# Patient Record
Sex: Female | Born: 1965
Health system: Southern US, Community
[De-identification: ages and names within clinical notes are randomized; demographics above are authoritative.]

## PROBLEM LIST (undated history)

## (undated) DIAGNOSIS — E039 Hypothyroidism, unspecified: Secondary | ICD-10-CM

## (undated) DIAGNOSIS — G40909 Epilepsy, unspecified, not intractable, without status epilepticus: Secondary | ICD-10-CM

## (undated) DIAGNOSIS — G250 Essential tremor: Secondary | ICD-10-CM

## (undated) DIAGNOSIS — E785 Hyperlipidemia, unspecified: Secondary | ICD-10-CM

## (undated) DIAGNOSIS — T4145XA Adverse effect of unspecified anesthetic, initial encounter: Secondary | ICD-10-CM

## (undated) DIAGNOSIS — T8859XA Other complications of anesthesia, initial encounter: Secondary | ICD-10-CM

## (undated) DIAGNOSIS — Z9889 Other specified postprocedural states: Secondary | ICD-10-CM

## (undated) DIAGNOSIS — I639 Cerebral infarction, unspecified: Secondary | ICD-10-CM

## (undated) DIAGNOSIS — R112 Nausea with vomiting, unspecified: Secondary | ICD-10-CM

## (undated) HISTORY — PX: TOTAL KNEE ARTHROPLASTY: SHX125

## (undated) HISTORY — DX: Hyperlipidemia, unspecified: E78.5

## (undated) HISTORY — PX: APPENDECTOMY: SHX54

## (undated) HISTORY — PX: ABDOMINAL HYSTERECTOMY: SHX81

## (undated) HISTORY — PX: CARPAL TUNNEL RELEASE: SHX101

## (undated) HISTORY — DX: Cerebral infarction, unspecified: I63.9

---

## 1998-10-20 ENCOUNTER — Inpatient Hospital Stay (HOSPITAL_COMMUNITY): Admission: AD | Admit: 1998-10-20 | Discharge: 1998-10-20 | Payer: Self-pay | Admitting: *Deleted

## 1998-10-26 ENCOUNTER — Inpatient Hospital Stay (HOSPITAL_COMMUNITY): Admission: AD | Admit: 1998-10-26 | Discharge: 1998-10-26 | Payer: Self-pay | Admitting: *Deleted

## 1998-11-09 ENCOUNTER — Inpatient Hospital Stay (HOSPITAL_COMMUNITY): Admission: AD | Admit: 1998-11-09 | Discharge: 1998-11-11 | Payer: Self-pay | Admitting: *Deleted

## 1998-11-09 ENCOUNTER — Encounter (INDEPENDENT_AMBULATORY_CARE_PROVIDER_SITE_OTHER): Payer: Self-pay

## 1998-12-15 ENCOUNTER — Other Ambulatory Visit: Admission: RE | Admit: 1998-12-15 | Discharge: 1998-12-15 | Payer: Self-pay | Admitting: *Deleted

## 1999-03-19 ENCOUNTER — Inpatient Hospital Stay (HOSPITAL_COMMUNITY): Admission: EM | Admit: 1999-03-19 | Discharge: 1999-03-24 | Payer: Self-pay | Admitting: Emergency Medicine

## 1999-03-19 ENCOUNTER — Encounter: Payer: Self-pay | Admitting: Emergency Medicine

## 1999-03-20 ENCOUNTER — Encounter: Payer: Self-pay | Admitting: Neurology

## 1999-03-21 ENCOUNTER — Encounter: Payer: Self-pay | Admitting: Neurology

## 1999-10-28 ENCOUNTER — Encounter: Payer: Self-pay | Admitting: Emergency Medicine

## 1999-10-28 ENCOUNTER — Emergency Department (HOSPITAL_COMMUNITY): Admission: EM | Admit: 1999-10-28 | Discharge: 1999-10-28 | Payer: Self-pay | Admitting: Emergency Medicine

## 1999-12-19 ENCOUNTER — Other Ambulatory Visit: Admission: RE | Admit: 1999-12-19 | Discharge: 1999-12-19 | Payer: Self-pay | Admitting: *Deleted

## 1999-12-22 ENCOUNTER — Encounter (INDEPENDENT_AMBULATORY_CARE_PROVIDER_SITE_OTHER): Payer: Self-pay | Admitting: Specialist

## 1999-12-22 ENCOUNTER — Ambulatory Visit (HOSPITAL_COMMUNITY): Admission: RE | Admit: 1999-12-22 | Discharge: 1999-12-22 | Payer: Self-pay | Admitting: Gastroenterology

## 2000-02-19 ENCOUNTER — Encounter: Payer: Self-pay | Admitting: Neurology

## 2000-02-19 ENCOUNTER — Ambulatory Visit (HOSPITAL_COMMUNITY): Admission: RE | Admit: 2000-02-19 | Discharge: 2000-02-19 | Payer: Self-pay | Admitting: Neurology

## 2000-06-29 ENCOUNTER — Ambulatory Visit (HOSPITAL_BASED_OUTPATIENT_CLINIC_OR_DEPARTMENT_OTHER): Admission: RE | Admit: 2000-06-29 | Discharge: 2000-06-29 | Payer: Self-pay | Admitting: Surgery

## 2000-08-10 ENCOUNTER — Emergency Department (HOSPITAL_COMMUNITY): Admission: EM | Admit: 2000-08-10 | Discharge: 2000-08-10 | Payer: Self-pay | Admitting: Emergency Medicine

## 2000-08-10 ENCOUNTER — Encounter: Payer: Self-pay | Admitting: Emergency Medicine

## 2000-09-28 ENCOUNTER — Encounter: Payer: Self-pay | Admitting: Surgery

## 2000-09-28 ENCOUNTER — Ambulatory Visit (HOSPITAL_COMMUNITY): Admission: RE | Admit: 2000-09-28 | Discharge: 2000-09-28 | Payer: Self-pay | Admitting: Surgery

## 2000-10-24 ENCOUNTER — Ambulatory Visit (HOSPITAL_COMMUNITY): Admission: RE | Admit: 2000-10-24 | Discharge: 2000-10-24 | Payer: Self-pay | Admitting: Obstetrics and Gynecology

## 2000-12-08 ENCOUNTER — Encounter: Payer: Self-pay | Admitting: Emergency Medicine

## 2000-12-08 ENCOUNTER — Emergency Department (HOSPITAL_COMMUNITY): Admission: EM | Admit: 2000-12-08 | Discharge: 2000-12-08 | Payer: Self-pay | Admitting: Emergency Medicine

## 2000-12-13 ENCOUNTER — Ambulatory Visit (HOSPITAL_COMMUNITY): Admission: RE | Admit: 2000-12-13 | Discharge: 2000-12-13 | Payer: Self-pay | Admitting: Gastroenterology

## 2000-12-13 ENCOUNTER — Encounter (INDEPENDENT_AMBULATORY_CARE_PROVIDER_SITE_OTHER): Payer: Self-pay | Admitting: Specialist

## 2000-12-24 ENCOUNTER — Other Ambulatory Visit: Admission: RE | Admit: 2000-12-24 | Discharge: 2000-12-24 | Payer: Self-pay | Admitting: *Deleted

## 2001-02-20 ENCOUNTER — Ambulatory Visit (HOSPITAL_COMMUNITY): Admission: RE | Admit: 2001-02-20 | Discharge: 2001-02-20 | Payer: Self-pay | Admitting: Neurology

## 2001-02-20 ENCOUNTER — Emergency Department (HOSPITAL_COMMUNITY): Admission: EM | Admit: 2001-02-20 | Discharge: 2001-02-20 | Payer: Self-pay | Admitting: Emergency Medicine

## 2001-04-08 ENCOUNTER — Encounter: Payer: Self-pay | Admitting: Obstetrics and Gynecology

## 2001-04-08 ENCOUNTER — Ambulatory Visit (HOSPITAL_COMMUNITY): Admission: RE | Admit: 2001-04-08 | Discharge: 2001-04-08 | Payer: Self-pay | Admitting: Obstetrics and Gynecology

## 2002-03-17 ENCOUNTER — Inpatient Hospital Stay (HOSPITAL_COMMUNITY): Admission: EM | Admit: 2002-03-17 | Discharge: 2002-03-19 | Payer: Self-pay | Admitting: Emergency Medicine

## 2002-03-17 ENCOUNTER — Encounter: Payer: Self-pay | Admitting: Neurology

## 2002-03-17 ENCOUNTER — Encounter: Payer: Self-pay | Admitting: Emergency Medicine

## 2002-03-18 ENCOUNTER — Encounter (INDEPENDENT_AMBULATORY_CARE_PROVIDER_SITE_OTHER): Payer: Self-pay | Admitting: Cardiology

## 2002-04-01 ENCOUNTER — Ambulatory Visit: Admission: RE | Admit: 2002-04-01 | Discharge: 2002-04-01 | Payer: Self-pay | Admitting: Internal Medicine

## 2002-10-29 ENCOUNTER — Ambulatory Visit (HOSPITAL_COMMUNITY): Admission: RE | Admit: 2002-10-29 | Discharge: 2002-10-29 | Payer: Self-pay | Admitting: Specialist

## 2002-11-20 ENCOUNTER — Other Ambulatory Visit: Admission: RE | Admit: 2002-11-20 | Discharge: 2002-11-20 | Payer: Self-pay | Admitting: Obstetrics and Gynecology

## 2003-07-02 ENCOUNTER — Ambulatory Visit (HOSPITAL_COMMUNITY): Admission: RE | Admit: 2003-07-02 | Discharge: 2003-07-02 | Payer: Self-pay | Admitting: Orthopedic Surgery

## 2003-08-07 ENCOUNTER — Emergency Department (HOSPITAL_COMMUNITY): Admission: EM | Admit: 2003-08-07 | Discharge: 2003-08-07 | Payer: Self-pay | Admitting: Emergency Medicine

## 2003-10-01 ENCOUNTER — Emergency Department (HOSPITAL_COMMUNITY): Admission: EM | Admit: 2003-10-01 | Discharge: 2003-10-01 | Payer: Self-pay | Admitting: *Deleted

## 2004-01-31 ENCOUNTER — Emergency Department (HOSPITAL_COMMUNITY): Admission: EM | Admit: 2004-01-31 | Discharge: 2004-01-31 | Payer: Self-pay | Admitting: Family Medicine

## 2004-03-31 ENCOUNTER — Emergency Department (HOSPITAL_COMMUNITY): Admission: EM | Admit: 2004-03-31 | Discharge: 2004-03-31 | Payer: Self-pay | Admitting: Emergency Medicine

## 2005-03-11 ENCOUNTER — Emergency Department (HOSPITAL_COMMUNITY): Admission: EM | Admit: 2005-03-11 | Discharge: 2005-03-11 | Payer: Self-pay | Admitting: Emergency Medicine

## 2005-10-31 ENCOUNTER — Ambulatory Visit (HOSPITAL_COMMUNITY): Admission: RE | Admit: 2005-10-31 | Discharge: 2005-10-31 | Payer: Self-pay | Admitting: Endocrinology

## 2006-01-24 ENCOUNTER — Encounter: Admission: RE | Admit: 2006-01-24 | Discharge: 2006-01-24 | Payer: Self-pay | Admitting: Endocrinology

## 2006-03-09 ENCOUNTER — Ambulatory Visit (HOSPITAL_BASED_OUTPATIENT_CLINIC_OR_DEPARTMENT_OTHER): Admission: RE | Admit: 2006-03-09 | Discharge: 2006-03-09 | Payer: Self-pay | Admitting: Specialist

## 2006-08-01 ENCOUNTER — Encounter: Admission: RE | Admit: 2006-08-01 | Discharge: 2006-08-01 | Payer: Self-pay | Admitting: Endocrinology

## 2007-02-11 ENCOUNTER — Inpatient Hospital Stay (HOSPITAL_COMMUNITY): Admission: RE | Admit: 2007-02-11 | Discharge: 2007-02-13 | Payer: Self-pay | Admitting: Orthopedic Surgery

## 2007-04-09 ENCOUNTER — Emergency Department (HOSPITAL_COMMUNITY): Admission: EM | Admit: 2007-04-09 | Discharge: 2007-04-09 | Payer: Self-pay | Admitting: Emergency Medicine

## 2007-07-19 ENCOUNTER — Emergency Department (HOSPITAL_COMMUNITY): Admission: EM | Admit: 2007-07-19 | Discharge: 2007-07-20 | Payer: Self-pay | Admitting: Emergency Medicine

## 2007-09-05 ENCOUNTER — Emergency Department (HOSPITAL_COMMUNITY): Admission: EM | Admit: 2007-09-05 | Discharge: 2007-09-05 | Payer: Self-pay | Admitting: Family Medicine

## 2008-02-18 ENCOUNTER — Emergency Department (HOSPITAL_COMMUNITY): Admission: EM | Admit: 2008-02-18 | Discharge: 2008-02-18 | Payer: Self-pay | Admitting: Emergency Medicine

## 2008-12-24 ENCOUNTER — Ambulatory Visit (HOSPITAL_COMMUNITY): Admission: RE | Admit: 2008-12-24 | Discharge: 2008-12-24 | Payer: Self-pay | Admitting: Gastroenterology

## 2009-03-11 ENCOUNTER — Emergency Department (HOSPITAL_COMMUNITY): Admission: EM | Admit: 2009-03-11 | Discharge: 2009-03-11 | Payer: Self-pay | Admitting: Emergency Medicine

## 2009-11-16 ENCOUNTER — Encounter: Admission: RE | Admit: 2009-11-16 | Discharge: 2009-11-16 | Payer: Self-pay | Admitting: Obstetrics & Gynecology

## 2010-04-13 LAB — POCT I-STAT, CHEM 8
BUN: 14 mg/dL (ref 6–23)
Calcium, Ion: 1.13 mmol/L (ref 1.12–1.32)
Chloride: 106 mEq/L (ref 96–112)
Creatinine, Ser: 0.6 mg/dL (ref 0.4–1.2)
Glucose, Bld: 126 mg/dL — ABNORMAL HIGH (ref 70–99)
HCT: 42 % (ref 36.0–46.0)
Hemoglobin: 14.3 g/dL (ref 12.0–15.0)
Potassium: 3.1 meq/L — ABNORMAL LOW (ref 3.5–5.1)
Sodium: 140 meq/L (ref 135–145)
TCO2: 15 mmol/L (ref 0–100)

## 2010-04-13 LAB — VALPROIC ACID LEVEL: Valproic Acid Lvl: 13.9 ug/mL — ABNORMAL LOW (ref 50.0–100.0)

## 2010-05-09 LAB — BASIC METABOLIC PANEL
BUN: 15 mg/dL (ref 6–23)
Creatinine, Ser: 0.86 mg/dL (ref 0.4–1.2)
GFR calc non Af Amer: >60 mL/min (ref >60)
Potassium: 3.3 mEq/L — ABNORMAL LOW (ref 3.5–5.1)

## 2010-05-09 LAB — CBC
Platelets: 250 10*3/uL (ref 150–400)
WBC: 8.8 10*3/uL (ref 4.0–10.5)

## 2010-05-09 LAB — DIFFERENTIAL
Lymphocytes Relative: 45 % (ref 12–46)
Lymphs Abs: 3.9 10*3/uL (ref 0.7–4.0)
Neutro Abs: 4 10*3/uL (ref 1.7–7.7)
Neutrophils Relative %: 46 % (ref 43–77)

## 2010-05-10 ENCOUNTER — Emergency Department (HOSPITAL_COMMUNITY)
Admission: EM | Admit: 2010-05-10 | Discharge: 2010-05-11 | Disposition: A | Discharge status: Home / Self Care | Payer: 59 | Attending: Emergency Medicine | Admitting: Emergency Medicine

## 2010-05-10 DIAGNOSIS — R42 Dizziness and giddiness: Secondary | ICD-10-CM | POA: Insufficient documentation

## 2010-05-10 DIAGNOSIS — R55 Syncope and collapse: Secondary | ICD-10-CM | POA: Insufficient documentation

## 2010-05-10 DIAGNOSIS — G40909 Epilepsy, unspecified, not intractable, without status epilepticus: Secondary | ICD-10-CM | POA: Insufficient documentation

## 2010-05-10 DIAGNOSIS — M542 Cervicalgia: Secondary | ICD-10-CM | POA: Insufficient documentation

## 2010-05-10 DIAGNOSIS — I10 Essential (primary) hypertension: Secondary | ICD-10-CM | POA: Insufficient documentation

## 2010-05-10 LAB — DIFFERENTIAL
Lymphs Abs: 2.3 10*3/uL (ref 0.7–4.0)
Monocytes Relative: 10 % (ref 3–12)
Neutro Abs: 2.6 10*3/uL (ref 1.7–7.7)
Neutrophils Relative %: 47 % (ref 43–77)

## 2010-05-10 LAB — CBC
Hemoglobin: 12.3 g/dL (ref 12.0–15.0)
MCH: 32.1 pg (ref 26.0–34.0)
MCV: 95 fL (ref 78.0–100.0)
RBC: 3.83 MIL/uL — ABNORMAL LOW (ref 3.87–5.11)

## 2010-05-11 ENCOUNTER — Emergency Department (HOSPITAL_COMMUNITY): Payer: 59

## 2010-05-11 ENCOUNTER — Encounter (HOSPITAL_COMMUNITY): Payer: Self-pay

## 2010-05-11 LAB — COMPREHENSIVE METABOLIC PANEL
AST: 18 U/L (ref 0–37)
Alkaline Phosphatase: 56 U/L (ref 39–117)
CO2: 29 mEq/L (ref 19–32)
Chloride: 101 mEq/L (ref 96–112)
Creatinine, Ser: 0.96 mg/dL (ref 0.4–1.2)
GFR calc Af Amer: >60 mL/min (ref >60)
GFR calc non Af Amer: >60 mL/min (ref >60)
Potassium: 4.1 mEq/L (ref 3.5–5.1)
Total Bilirubin: 0.6 mg/dL (ref 0.3–1.2)

## 2010-05-11 LAB — URINALYSIS, ROUTINE W REFLEX MICROSCOPIC
Glucose, UA: NEGATIVE mg/dL
Nitrite: NEGATIVE
Specific Gravity, Urine: 1.013 (ref 1.005–1.030)
pH: 6.5 (ref 5.0–8.0)

## 2010-05-11 LAB — VALPROIC ACID LEVEL: Valproic Acid Lvl: 97.8 ug/mL (ref 50.0–100.0)

## 2010-05-11 LAB — POCT CARDIAC MARKERS: CKMB, poc: <1 ng/mL — ABNORMAL LOW (ref 1.0–8.0)

## 2010-06-07 NOTE — Op Note (Signed)
NAMEILETA, OFARRELL                  ACCOUNT NO.:  [id number]   MEDICAL RECORD NO.:  [id number]          PATIENT TYPE:  INP   LOCATION:  0001                         FACILITY:  [facility]   PHYSICIAN:  Madlyn Frankel. Charlann Boxer, M.D.  DATE OF BIRTH:  [birth year]   DATE OF PROCEDURE:  02/11/2007  DATE OF DISCHARGE:                               OPERATIVE REPORT   PREOPERATIVE DIAGNOSIS:  Left knee patellofemoral osteoarthritis.   POSTOPERATIVE DIAGNOSIS:  Left knee patellofemoral osteoarthritis.   PROCEDURE:  Left knee patellofemoral replacement utilizing a Biomet PFR  system with a small trochlear replacement and a medium or 34 mm patellar  button.   SURGEON:  Madlyn Frankel. Charlann Boxer, M.D.   Threasa HeadsPayton Emerald.   ANESTHESIA:  Duramorph spinal.   COMPLICATIONS:  None.   DRAINS:  Times one.   TOURNIQUET TIME:  Forty-one minutes at 250 mmHg.   INDICATIONS FOR PROCEDURE:  Ms. Ludwick is a pleasant 45 year old female  who was seen and evaluated last year for evaluation of left knee  anterior knee pain that was unresponsive to conservative measures.  Given the failure to respond to conservative measures she wished to  proceed with more definitive procedure and was prepared for arthroscopic  surgery.  Risks and benefits were discussed including limited data  supporting patellofemoral arthroplasty and potential revision with total  knee replacement, progression of arthritis in other compartments, DVT,  infection.  Consent was obtained.   PROCEDURE IN DETAIL:  The patient was brought to the operative theater.  Once adequate anesthesia preoperative antibiotics, Ancef, were  administered the patient was positioned supine.  Thigh tourniquet was  placed.  Left lower extremity was prescrubbed and prepped and draped in  sterile fashion.  A midline incision was made, carried distally beyond  the previous transverse incision.  Median arthrotomy was carried down to  the tubercle.  Following some initial debridement of  scar around the  subcutaneous layers as well as in the intra-articular region the patella  was gently subluxed enough to make my cut.  I debrided around the  patella, measured with calipers and resected off the patella.  The size  of the patella appeared to fit best with a 34 medium size patella.  These holes were drilled and then with the poly trial in place the  patella was restored back to its normal height.  There was noted to be  some erosive changes to the patella medial  facet.  At this point I  checked the patella with the trial in place to prevent damage from  retractors.   At this point now attention was directed to the femur.  A drill hole was  created 1 cm anterior to the PCL insertion.  I irrigated the canal.  The  intramedullary rod was passed and I checked for symmetry to Whiteside's  line, were perpendicular to Whiteside's line as well as equal to the  epicondylar axis.  Once this was checked was set I checked with the  stylus and made the anterior cut.  At this point I used a combination of  the  hand rasp that came in the Biomet set and high speed bur and shaped  the bone within the trochlear groove so this fit so that the trochlear  and femoral replacement sat flush with the cartilage surface.  Once I  was satisfied this sat flush I pinned it in position, made the central  hole for the component.  At this point all trial components were  removed, the knee was irrigated and synovial capsule layer was injected  60 mL of 0.25% Marcaine with epinephrine.  Once the cement was mixed the  components which were opened were cemented in position and excessive  cement was debrided.  Once the cement had cured I re-irrigated the knee  with the tourniquet down.  There was no significant hemostasis required.  Medium Hemovac drain was placed.  Extensor mechanism was reapproximated  using #1 PDS due to his Vicryl sensitivity.  Subcu layer was used with 2-  0 Monocryl due to Vicryl  sensitivity and 4-0 Monocryl on the skin.  The  skin was cleaned and dried and dressed sterilely with Steri-Strips and a  bulky dressing.  She was brought to the recovery room in stable  condition.      Madlyn Frankel Charlann Boxer, M.D.  Electronically Signed     MDO/MEDQ  D:  02/11/2007  T:  02/11/2007  Job:  [id number]

## 2010-06-10 NOTE — H&P (Signed)
[facility], Alyssa Mccarthy                  ACCOUNT NO.:  [id number]   MEDICAL RECORD NO.:  [id number]         PATIENT TYPE:  LINP   LOCATION:                               FACILITY:  [facility]   PHYSICIAN:  Madlyn Frankel. Charlann Boxer, M.D.  DATE OF BIRTH:  06-26-1965   DATE OF ADMISSION:  02/11/2007  DATE OF DISCHARGE:                              HISTORY & PHYSICAL   PROCEDURE:  A left patellofemoral replacement.   CHIEF COMPLAINT:  Left knee pain.   HISTORY OF PRESENT ILLNESS:  A 45 year old female with a history of left  knee pain secondary to patellofemoral osteoarthritis.  He has been  refractory to all conservative treatment.  She is healthy and been  presurgically assessed prior to surgery and is clear for a left  patellofemoral placement.   PAST MEDICAL HISTORY:  Significant for  1. Osteoarthritis.  2. Seizures.  3. Hypertension.   PAST SURGICAL HISTORY:  1. Left knee surgeries x7.  2. Carpal tunnel release in 1996.  3. Appendectomy.  4. Lumpectomy left breast 1999.   SOCIAL HISTORY:  The patient is a married Engineer, civil (consulting).  Primary caregiver will  be husband after surgery.   ALLERGIES:  1. VICRYL SUTURES.  2. PLAVIX.  3. PENICILLIN.   MEDICATIONS:  1. Heparin XL 1000 mg p.o. b.i.d.  2. Zoloft 75 mg p.o. daily.  3. Diazepam 160 mg p.o. daily.  4. Ambien 10 mg p.o. nightly.  5. Vitamin B6 100 mg daily.  6. Vytorin 10/20 p.o. daily.  7. Aspirin 325 mg p.o. daily.   REVIEW OF SYSTEMS:  None other than HPI.   PHYSICAL EXAMINATION:  VITAL SIGNS:  Pulse 72, respirations 18, blood  pressure 124/82.  GENERAL:  Awake, alert and oriented, well-developed, well-nourished, no  acute distress.  NECK:  Supple.  No carotid bruits.  CHEST:  Lungs are clear to auscultation bilaterally.  BREASTS:  Deferred.  HEART:  Regular rate and rhythm.  No murmurs.  ABDOMEN:  Soft, nontender, nondistended.  Bowel sounds present.  GENITOURINARY:  Deferred.  EXTREMITIES:  Left lower extremity has dorsalis pedis  pulse positive.  She has significant tenderness.  SKIN:  No cellulitis.  NEUROLOGIC:  Intact distal sensibilities.   Labs, EKG and chest x-ray are all pending presurgical testing.   IMPRESSION:  1. Patellofemoral osteoarthritis.  2. Seizures.  3. Hypertension.   PLAN OF ACTION:  Left patellofemoral replacement at [name], February 11, 2007, by surgeon, Dr. Durene Romans.  Risks and  complications were discussed.   Postoperative medications were provided at time of history and physical,  including Lovenox, Robaxin, iron, aspirin, Colace, MiraLax.  Postoperative pain medicines will be provided at time of surgery.     ______________________________  Yetta Glassman [address], [place]      Madlyn Frankel. Charlann Boxer, M.D.  Electronically Signed    BLM/MEDQ  D:  01/25/2007  T:  01/25/2007  Job:  [id number]   cc:   Jeannett Senior A. Evlyn Kanner, M.D.  Fax: [phone]   Barrington Ellison, M.D.  [facility]

## 2010-06-10 NOTE — H&P (Signed)
[facility]  Patient:    [facility], [facility] Visit Number: [id number] MRN: [id number]          Service Type: EMS Location: [facility] Attending Physician:  [facility] Dictated by:   Alyssa Amabile. Roslyn Smiling, M.D. Admit Date:  08/10/2000 Discharge Date: 08/10/2000                           History and Physical  CHIEF COMPLAINT:              Chronic left lower quadrant pain.  HISTORY OF PRESENT ILLNESS:   [age]-year-old woman, using vasectomy for contraception, admitted for diagnostic laparoscopy to evaluate persistent left lower quadrant pain.  She has been troubled by this for more than a year.  She has been evaluated with colonoscopy.  Recent CT scan from [facility] showed a negative abdominal evaluation but the uterus was described as projecting upward to the left and with a question of a fundal fibroid.  There was no evidence of diverticulosis or diverticulitis on that study.  Pelvic ultrasound this week revealed no uterine abnormality, and ovaries are normal size.  The patient is having to take Darvocet for management of the pain and, to rule out endometriosis or pelvic adhesions, she is admitted now for diagnostic laparoscopy.  She is status post laparoscopic in the early 1990s for left-sided discomfort, as well.  No abnormality was seen at that time.  She has been counseled regarding the benefits, risks, options, and expected outcome of the procedure prior to surgery.  Dr. Eliberto [facility]ccarthy. [facility] will be performing the procedure.  PAST MEDICAL HISTORY:         1. Seizure disorder.                               2. Hypertension.                               3. History of GI bleeding in the past.                               4. Questionable sagittal sinus thrombosis.                               5. GERD.  PAST SURGICAL HISTORY:        1. Laparoscopic in early 1990s.                               2. Appendectomy in 1988.     3. Anal fistula repair in 2002.  OBSTETRICAL HISTORY:          Vaginal deliveries in 1993 and 2000.  ALLERGIES:                    PENICILLIN ?.  CODEINE causes NAUSEA AND VOMITING.  ELAVIL causes HALLUCINATIONS.  MEDICATIONS:                  Topamax, Depakote, aspirin 325 mg q.d., Altace, FiberCon.  FAMILY HISTORY:               Sister with pre-melanoma.  Brother with hypertension and sleep apnea.  Father with history of MI at age 10; died of congestive heart failure in his 15s.  Mother with squamous cell carcinoma of the skin.  SOCIAL HISTORY:               Denies tobacco use.  Drinks alcohol occasionally.  Married.  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  PHYSICAL EXAMINATION:  GENERAL:                      Uncomfortable-appearing woman.  VITAL SIGNS:                  Afebrile, vital signs stable.  Blood pressure 120/82.  HEENT:                        Within normal limits.  NECK:                         Without thyromegaly.  CHEST:                        Clear.  HEART:                        Regular rate and rhythm.  S1, S2 normal.  BREASTS:                      Without mass, tenderness, axillary or supraclavicular nodes.  ABDOMEN:                      Soft.  Moderate left lower quadrant tenderness. No rebound.  Normoactive bowel sounds.  Without organomegaly, mass, or hernia.  BACK:                         Without CVAT.  GENITOURINARY:                External genitalia, BUS, vagina without lesions. Cervix without lesion.  Uterus retroverted, normal size, nontender, mobile. Adnexa normal to palpation.  RECTOVAGINAL:                 Confirmatory.  EXTREMITIES:                  Without CCE.  SKIN:                         Without lesions.  NEUROLOGIC:                   Grossly intact.  ASSESSMENT AND PLAN:          1. Chronic left lower quadrant, worsening over                                  the last three weeks, rule out endometriosis                                   or adhesions.  Diagnostic laparoscopy will be                                  performed by Dr. Rosalio Mccarthy on October 24, 2000.                               2. Seizure disorder.                               3. Hypertension.                               4. Question of sagittal sinus thrombosis                                  history.                               5. Anal fistula repair. Dictated by:   Alyssa Amabile Roslyn Smiling, M.D. Attending Physician:  [facility] DD:  10/23/00 TD:  10/23/00 Job: [id number]

## 2010-06-10 NOTE — H&P (Signed)
NAME:  [facility], [facility]                         ACCOUNT NO.:  [id number]   MEDICAL RECORD NO.:  [id number]                   PATIENT TYPE:  EMS   LOCATION:  ED                                   FACILITY:  [facility]   PHYSICIAN:  Genene Churn. Love, M.D.                 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   HISTORY OF PRESENT ILLNESS:  The patient has a history of suspected seizures  beginning in 1985 with initially abnormal EEGs, but subsequently has been  followed with episodes of nonepileptic seizures and has been seen by Dr.  Domenic Schwab at [facility] in [place], [place] where she is undergoing a taper of Depakote.  Currently, she is on 250 mg q.a.m. and 500 mg q.h.s.  Her last episodes of  seizures occurred in May of 2003.  She has done well, but has had some  recent complaints of word finding difficulties and increasing tremor.  This  day about 2:30 she was working and sat down because she felt somewhat  lightheaded.  Her blood pressure was a systolic of 107.  She went to a  wheelchair.  She felt heaviness in her legs and put her head down and then  awoke with heaviness in her legs.  She also noted difficulty with shortness  of breath and headache and chest pain.  She was seen in the emergency room  by Carleene Cooper, M.D. and referred for further evaluation.  The patient has  been doing well in terms of recent activities and is being followed by Dr.  Domenic Schwab on a tapering course of Depakote and was to see him the next day.   PAST MEDICAL HISTORY:  1. Left  knee surgery x5.  2. Hysterectomy with oophorectomy and partial bowel resection May 2003.  3. Laparoscopy for endometriosis 1980.  4. Laparoscopy October 2002.  5. 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  6. She had an appendectomy in 1984.  7. She has had right carpal tunnel syndrome surgery.   MEDICATIONS:  1. Depakote 250 mg q.a.m. and 500 mg q.h.s.  2. Altace 2.5 mg q.a.m.  3. Aspirin 325 mg daily.  4. Estrogen 2.0 mg daily.   ALLERGIES:  She has a history of allergy to PENICILLIN and IVP DYE.  She has  an intolerance to narcotics.   FAMILY HISTORY:  Reveals that her mother is 73, living and well.  Father  died at 28 from congestive heart failure.  She has brothers 64, 53, and 17  and sisters 88, 39, and 61 living and well.  She has two children, a son 21  and a daughter 3 living and well.   PHYSICAL EXAMINATION:  GENERAL:  Well-developed, pleasant white female in no  acute distress.  VITAL SIGNS:  Her blood pressure sitting in the right and left arm was  120/80, 110/80, heart rate 116 and regular.  NECK:  There were no carotid or supraclavicular bruits heard.  The neck  flexion and extension maneuvers were unremarkable.  NEUROLOGIC:  Mental status:  She was alert and oriented x3 and followed one,  two, and three step commands.  Cranial nerve examination revealed visual  fields full, disks flat, spontaneous venous pulsations seen.  Extraocular  movements full.  Corneals present.  Facial sensation equal.  No facial motor  asymmetry.  Hearing present.  Air conduction greater than bone conduction.  Tongue midline.  Uvula midline.  Gag is present.  Sternocleidomastoid,  trapezius testing normal.  Motor examination:  5/5 strength proximally and  distally in the upper and lower extremities without any evidence of proximal   pronator distal drift. Coordination testing normal.  Sensory examination:  Intact to pinprick, touch, joint position and vibration testing.  Deep  tendon reflexes 1-2+ and plantar responses downgoing.  Gait examination not  obtained.  HEENT:  Tympanic membranes clear.  LUNGS:  Clear to auscultation.  HEART:  No murmurs.  ABDOMEN:  Bowel sounds were normal.  There is no enlargement of liver,  spleen, or kidneys.  EXTREMITIES:  She was status post surgery to her left knee on multiple  occasions.   LABORATORY DATA:  CT scan of the brain without contrast showing no  significant abnormalities.  White blood cell count 4200, hemoglobin 12.2,  hematocrit 34.7, platelet count 146,000.  Sodium 136, potassium 3.7,  chloride 105, CO2 content 28, BUN 10, creatinine 0.7, glucose 86.  CK 47.  Urinalysis negative.  Valproic acid level 79.2.  Liver function tests  normal.  EKG showing normal sinus rhythm, rule out lateral ischemia.  Chest  x-ray unremarkable except for an increased density at the left base.   IMPRESSION:  1. Syncope (code 780.2).  2. History of seizures (code 345.10).  3. Chest pain.  4. Shortness of breath.   PLAN:  Admit the patient to telemetry.  Obtain CKs, arterial blood gases,  and CT scan of the chest in view of the ________ abnormality on chest x-ray.                                               Genene Churn. Sandria Manly, M.D.    JML/MEDQ  D:  03/17/2002  T:  03/17/2002  Job:  [id number]

## 2010-06-10 NOTE — Op Note (Signed)
[facility], [facility]                  ACCOUNT NO.:  [id number]   MEDICAL RECORD NO.:  [id number]          PATIENT TYPE:  AMB   LOCATION:  [facility]                         FACILITY:  [facility]   PHYSICIAN:  Jene Every, M.D.    DATE OF BIRTH:  1965/08/03   DATE OF PROCEDURE:  03/09/2006  DATE OF DISCHARGE:                               OPERATIVE REPORT   PREOPERATIVE DIAGNOSIS:  Patellofemoral chondromalacia of the left knee.   POSTOPERATIVE DIAGNOSIS:  Patellofemoral chondromalacia of the left  knee; loose bodies.   PROCEDURES PERFORMED:  1. Left knee arthroscopy.  2. Chondroplasty of the patella and femoral condyles.  3. Debridement of the medial lateral compartment.  4. Evacuation of loose bodies.   ANESTHESIA:  General.   ASSISTANT:  None.   BRIEF HISTORY:  This is a 45 year old with refractory knee pain, status  post multiple procedures to realign the patellofemoral joint.  She was  indicated for a debridement.  The risks and benefits were discussed,  including bleeding, infection, neurovascular injury, no change in  symptoms, worsening symptoms and need for repeat debridement in the  future.  She reported 2 weeks to this had episode of the kneecap going  out on her, which she has had in the past prior to her reconstruction.  It has not happened since that.   Preoperative exam revealed no subluxation; I was unable to dislocate it.  No apprehension.  We discussed the possibility of lateral retinacular  release; it was felt to be tight and lateralizing intraoperatively.   DESCRIPTION OF PROCEDURE:  With the patient in supine position after  induction of adequate general anesthesia and 1 gram Kefzol, the left  lower extremity was prepped and draped in the usual sterile fashion.  A  lateral parapatellar portal and superomedial parapatellar portal was  fashioned with a #11 blade.  Ingress cannula atraumatically placed.  __________ was utilized to insufflate the joint.   Under  direct visualization, a medial parapatellar portal was fashioned  with a #11 blade -- after localization with an 18-gauge needle, sparing  the medial meniscus.  Inspection revealed significant chondromalacia of  the patellofemoral joint; particularly of the patella.  There was,  however, normal patellofemoral tracking.  There was no significant  lateralization or subluxation.  The lateral retinaculum did not appear  particularly tight.  I performed a chondroplasty of the patella and  femoral sulcus with the __________  shaver.  There were loose  cartilaginous bodies in the medial and lateral compartment; these were  debrided and evacuated, each under 2-3 mm in diameter.  The menisci  appeared to be stable to probe palpation, without evidence of tearing.  There was good cartilage in the medial and lateral compartment, in the  femoral condyle and the tibial plateau.  There were no chondral defects,  osteochondral defects.  Menisci was stable to probe palpation.  The ACL  and PCL were essentially unremarkable.  The entire patella was involved  with grade 3 changes; no grade 4 changes were noted.   Gutters were evaluated, there was no pathology here.  The  knee was  copiously lavaged.  All compartments were revisited and no loose  cartilaginous debris noted.  All instrumentation was removed.  The  portals were closed with 4-0 nylon simple sutures.  Marcaine 0.25% with  epinephrine was infiltrated into the joint.  The wound was dressed  sterilely.  She was awakened without difficulty and transported to the  recovery room in satisfactory condition.   The patient tolerated the procedure well and there were no  complications.      Jene Every, M.D.  Electronically Signed     JB/MEDQ  D:  03/09/2006  T:  03/09/2006  Job:  [id number]

## 2010-06-10 NOTE — Consult Note (Signed)
NAMEELISIA, Alyssa Mccarthy                  ACCOUNT NO.:  [id number]   MEDICAL RECORD NO.:  [id number]          PATIENT TYPE:  EMS   LOCATION:  MAJO                         FACILITY:  [facility]   PHYSICIAN:  Pramod P. Pearlean Brownie, MD    DATE OF BIRTH:  01-06-1966   DATE OF CONSULTATION:  03/11/2005  DATE OF DISCHARGE:                                   CONSULTATION   REASON FOR REFERRAL:  Code stroke.   HISTORY OF PRESENT ILLNESS:  Alyssa Mccarthy is a 45 year old Caucasian lady who  went to sleep at a 2:45 per the husband and was fine. She woke up an hour  later she had trouble speaking. She complained of some numbness in the right  foot as well as some sharp, shooting pain behind her eye. Since then she has  had trouble expressing herself, but has had no right-sided weakness. She has  no know prior history of strokes, but does have history of non-epileptic  spells. She has had multiple visits to [facility] as well as [facility] and was seen previously by Dr. Sharene Skeans and Dr. Sandria Manly in our  practice, and subsequently was referred to Dr. Domenic Schwab at [facility] whom she  is still following with. She has a diagnosis of nonepileptic seizures and is  presently on Keppra 500 mg three and a half tablets a day. The patient's  husband states that recently she had an argument with her mother over the  weekend, but she seemed fine after that.   PAST MEDICAL HISTORY:  Significant for suspected seizures as stated above  and a question of small anterior sagittal sinus which may be a congenital  abnormal variant.   PAST SURGICAL HISTORY:  1.  Left knee surgery.  2.  Hysterectomy.  3.  Oophorectomy.  4.  Partial bowel resection.  5.  Laparoscopy.  6.  Appendectomy.  7.  Right carpal tunnel surgery.   MEDICATION LIST:  1.  Keppra 500 mg three and half tablets daily.  2.  Cymbalta 60 mg daily.   ALLERGIES:  PENICILLIN and IVP DYE.   FAMILY HISTORY:  Not significant for __________, seizures or neurologic  problems.   REVIEW OF SYSTEMS:  As stated above.   PHYSICAL EXAMINATION:  GENERAL: A pleasant young Caucasian lady, not in  distress.  VITAL SIGNS: Afebrile. Pulse rate 64 per minute and regular, respiratory  rate 12 per minute, blood pressure 129/75, saturation 100% on room air.  Distal pulses well felt.  HEENT: Unremarkable. Head is atraumatic.  NECK: Supple without bruits.  CARDIOVASCULAR: No murmur or gallops.  LUNGS: Clear to auscultation.  NEUROLOGIC: The patient is pleasant, awake, alert, and cooperative. She has  some speech difficulties, but these appear to be quite deliberate and slow.  When she is distracted at times she can speak short sentences quite  fluently. I asked the patient to write down the course of events that  happened today and she wrote quite fluently with detailed expression without  significant expressive difficulties in her handwriting. She is able to name,  repeat, and comprehend  excellently. Eye movements are full range without  nystagmus. Visual acuity and fields adequate. Face is symmetric. Palatal  movements are normal. Tongue is midline. Motor system exam reveals no upper  extremity drift, symmetric stance, reflexes, coordination, and sensation.  Her gait was not tested.   DATA REVIEWED:  Her previous hospital, ER visits, admission discharge  summaries, and Dr. Imagene Gurney consultation notes from 2004 were reviewed.  Admission CT scan from today is pending.   IMPRESSION:  A 45 year old lady with sudden onset of expressive speech  difficulties as well as some transient right foot numbness and right retro-  orbital pain which seem to have resolved. Her speech difficulties seem to be  nonorganic in nature and do not represent acute stroke. Hence, do not do any  further stroke workup. Just obtain a noncontrast CT scan of the head and if  that is normal she can be discharged home to follow up with her neurologist,  Dr. Domenic Schwab, at [facility] in the future. I  had a long discussion with the  patient and husband regarding her symptoms. Explained plan for evaluation,  treatment, and answered questions.           ______________________________  Sunny Schlein. Pearlean Brownie, MD     PPS/MEDQ  D:  03/11/2005  T:  03/12/2005  Job:  [id number]   cc:   Dr. Emilia Beck [facility]  Dept of Neurology

## 2010-06-10 NOTE — Consult Note (Signed)
[place]  Patient:    Alyssa Mccarthy, Alyssa Mccarthy [facility] Visit Number: [id number] MRN: [id number]          Service Type: EMS Location: [name] Attending Physician:  Cathren Laine Dictated by:   Genene Churn. Love, M.D. Proc. Date: 02/20/01 Admit Date:  02/20/2001                            Consultation Report  DATE OF BIRTH:  [birth year]  Mrs. Alyssa Mccarthy is a 45 year old right-handed white married female with a history of petite mal and generalized major motor seizures secondary to idiopathic epilepsy usually without myoclonus.  Medications have been Depakote 500 mg b.i.d., ______ 100 mg 500 mg q.d., Altace 2.5 mg q.d., and aspirin 325 mg q.d.  Recently when see on February 06, 2001 because of poor control of seizures she was placed on a course of Lamictal 25 mg q.o.d.  This morning while at work she was noted to have blinking eye movements and this afternoon began having generalized major motor activity.  She was seen in the EEG room where activity was noted to be adduction of her legs, turning her head to the right, forcibly closing her eyes with reactive pupils.  An EEG showed no evidence of any epileptiform activity.  A CK was 32, prolactin pending, CBC and comprehensive metabolic panel were normal.  PHYSICAL EXAMINATION  VITAL SIGNS:  When she awoke it was noted that her blood pressure in right and left arm was 120/60, heart rate 64.  She was afebrile.  Respiratory rate was 18.  NEUROLOGIC:  She was alert and oriented x3 and followed commands.  Cranial nerve examination revealed disks flat, spontaneous venous pulsations present, extraocular movements full, corneals present, and facial sensation equal.  Her facial motor asymmetry/hearing present.  Air conduction greater than bone conduction.  Tongue midline.  Uvula midline.  Gags present.  Motor examination:  Good strength to the upper and lower extremities.  Deep tendon reflexes 1-2+.  Plantar  responses downgoing.  IMPRESSION: 1. Non-epileptic seizures. 2. History of generalized epilepsy (code 345.10).  PLAN:  Give the patient 1 mg Ativan IV and have her return to the office tomorrow for further evaluation. Dictated by:   Genene Churn. Love, M.D. Attending Physician:  Cathren Laine DD:  02/20/01 TD:  02/21/01 Job: [id number]

## 2010-06-10 NOTE — Procedures (Signed)
[facility]  Patient:    Alyssa Mccarthy, Alyssa Mccarthy [facility] Visit Number: [id number] MRN: [id number]          Service Type: END Location: ENDO Attending Physician:  Rich Brave Dictated by:   Florencia Reasons, M.D. Proc. Date: 12/13/00 Admit Date:  12/13/2000 Discharge Date: 12/13/2000   CC:         Jeannett Senior A. Evlyn Kanner, M.D.   Procedure Report  PROCEDURE:  Upper endoscopy with biopsies.  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  FINDINGS:  Normal exam..  DESCRIPTION OF PROCEDURE:  The patient provided written consent for the procedure.  Sedation was fentanyl 37.5 mcg and Versed 5 mg IV without arrhythmias or desaturation.  The Olympus video endoscope was passed under direct vision.  The vocal cords looked normal.  The esophagus was entered without difficulty and had normal mucosa without evidence of reflux esophagitis, Barretts esophagus, varices, infection, or neoplasia.  No ring, stricture, or hiatal hernia was appreciated.  The stomach contained no significant residual.  There was just a little bit of antral erythema.  There was a fundic polyp which I biopsied.  The pylorus, duodenal bulb, and second duodenum looked normal.  Random biopsies were obtained of the duodenum and the antrum of the stomach.  No significant gastritis or evidence of ulcers or cancer were identified.  The scope was removed after obtaining the above-mentioned biopsies.  The patient tolerated the procedure well, and there were no apparent complication.  IMPRESSION:  Essentially normal endoscopy.  Small gastric polyps which is not felt to be clinically significant.  No source of anemia, anorexia, heme-positive stool, or weight loss evident on this exam.  PLAN:  Await pathology on  biopsies. Dictated by:   Florencia Reasons, M.D. Attending Physician:  Rich Brave DD:  12/13/00 TD:  12/15/00 Job: [id number]

## 2010-06-10 NOTE — Discharge Summary (Signed)
NAME:  Alyssa Mccarthy, Alyssa Mccarthy [facility]                         ACCOUNT NO.:  [id number]   MEDICAL RECORD NO.:  [id number]                   PATIENT TYPE:  INP   LOCATION:  0371                                 FACILITY:  [facility]   PHYSICIAN:  Marolyn Hammock. Thad Ranger, M.D.           DATE OF BIRTH:  1965-04-24   DATE OF ADMISSION:  03/17/2002  DATE OF DISCHARGE:  03/19/2002                                 DISCHARGE SUMMARY   ADMISSION DIAGNOSES:  1. Syncope.  2. Dyspnea.  3. History of non-epileptic seizures.   DISCHARGE DIAGNOSES:  1. Syncope.  2. Dyspnea, etiology uncertain.  3. History of non-epileptic seizures.   CONDITION ON DISCHARGE:  Stable.   DIET:  Regular.   ACTIVITY:  Ad lib.  No return to work until follow-up.   DISCHARGE MEDICATIONS:  1. Depakote 250 mg q.a.m., 500 mg q.h.s.  2. Altace 2.5 mg daily.  3. Aspirin daily.  4. Estrogen 2 mg daily.   STUDIES:  1. CT head March 17, 2002:  Negative.  2. Chest x-ray March 17, 2002:  Question increased density in the left     lower lung likely artifactual.  3. CT of chest PE protocol March 17, 2002:  Negative.  Specifically,     negative for PE.  4. Echocardiogram March 18, 2002:  Essentially normal with normal LV     function, EF 55-65%, no regional wall motion abnormalities, no     significant valvular disease, no evidence of increased right ventricular     pressure, although the pressure could not be directly measured.  5. Laboratory review:  A CMET unremarkable.  Admission CBC:  White cells     4.2, hemoglobin 12.2, platelets 146,000, normal differential.  Coags     normal.  Depakote level 79.  Urinalysis negative.  Cardiac enzymes     negative.  Beta hCG negative.  Initial ABG with pH 7.40, pAO2 72,     bicarbonate 25, pCO2 41.  Sedimentation rate 2.  Serum amylase 52.   HOSPITAL COURSE:  Please see admission H&P by Genene Churn. Love, M.D. for full  admission details.  Briefly, this is a 45 year old woman with a history  of  seizures thought to be non-epileptic, who was admitted after an episode in  which she developed lightheadedness, heaviness in her legs, and subsequently  had a syncopal spell.  She was admitted and the aforementioned tests were  obtained.  The patient did continue to complain of some mild shortness of  breath during the hospitalization but it seemed to get somewhat better.  Her  initial pAO2 shows slight hypoxia but subsequently oxygen saturations were  measured at 98% on room air.  She was worked up extensively for cardiac and  pulmonary causes of hypoxia including pulmonary embolus but no evidence of  an acute cardiopulmonary process was found.  On the morning of discharge she  appeared stable, afebrile with pulse 76, blood  pressure 110/83, respirations  18, O2 saturation 99% on room air.  She was deemed stable for discharge and  was sent home in the afternoon of March 19, 2002.  She was advised to  follow up with her primary physician, Tera Mater. [place] Martin, M.D. within one week  of hospital discharge and will follow up with her primary neurologist,  __________ at [facility] as needed or as scheduled.                                               Michael L. Thad Ranger, M.D.    MLR/MEDQ  D:  03/19/2002  T:  03/19/2002  Job:  [id number]   cc:   Genene Churn. Love, M.D.  [address]  [address]  [place]  [place] [address]  Fax: [phone]   Tera Mater. Evlyn Kanner, M.D.  [address]  [place]  [place] [address]  Fax: [phone]

## 2010-06-10 NOTE — Op Note (Signed)
NAME:  Alyssa Mccarthy, Alyssa Mccarthy [facility]                         ACCOUNT NO.:  [id number]   MEDICAL RECORD NO.:  [id number]                   PATIENT TYPE:  AMB   LOCATION:  DAY                                  FACILITY:  [facility]   PHYSICIAN:  Jene Every, M.D.                 DATE OF BIRTH:  1965-02-01   DATE OF PROCEDURE:  10/29/2002  DATE OF DISCHARGE:                                 OPERATIVE REPORT   PREOPERATIVE DIAGNOSIS:  Patellofemoral chondromalacia due to lateral  meniscal tear.   POSTOPERATIVE DIAGNOSES:  1. Patellofemoral chondromalacia due to lateral meniscal tear.  2. Chondromalacia of the tibial plateau.   PROCEDURE PERFORMED:  Left knee arthroscopy.  Chondroplasty of the patella, femoral sulcus and lateral tibial plateau.  Shaving of lateral meniscus.   ANESTHESIA:  General.   ASSISTANT:  None.   BRIEF HISTORY:  This is a 45 year old with refractory knee pain.  MRI  indicated anterior and lateral meniscus tear and chondromalacia of the  patellofemoral joint.  She is status post patellar realignment in the past.  She has been refractory to conservative treatment.  The risks and benefits  were discussed; include bleeding, infection, damage to neurovascular  structures, no change in symptoms, worsening of symptoms and need for repeat  procedure in the future, etc.   OPERATIVE TECHNIQUE:  With the patient in the supine position, and after  adequate level of general anesthesia and 1 g Kefzol, the left lower  extremity was prepped and draped in the usual sterile fashion.  A lateral  parapatellar portal and a superomedial parapatellar portal was fashioned  with the #11 blade.  __________ standard atraumatic __________ was utilized  to inspect the joint.  Under direct visualization the medial parapatellar  port was fashioned with the #11 blade, after local incision with 18-gauge  needle sparing the medial meniscus.   There was extensive patellofemoral chondromalacia, particularly  on the  patellar side; with extensive grade 3 changes and no grade 4 changes, normal  patellofemoral tracking.  The ACL and PCL were unremarkable.  The medial  meniscus, femoral condyle and tibial plateau showed some moderate grade 2  changes.  The meniscus was stable to focal palpation without evidence of a  tear.  On the lateral side there was a small tear in the anterior horn of  lateral meniscus, Grade 3 lesion of the anteromedial to the plateau.  But  there is no significant other fatality of the meniscus or of the femoral  condyle.   The shaver was introduced via multiple pulls, and we exchanged and performed  a full chondroplasty of the patella, femoral sulcus and tibial plateau; and,  shaved the anterior horn of the lateral meniscus.  All remnants were stable  to full palpation.  The small, loose cartilaginous debris was evacuated as  well.  All menisci and condylar surfaces were re-examined and no residual  pathology amenable to  arthroscopic procedure.   We copiously lavaged the knee.  All instrumentation was removed.  The  portals were closed with 4-0 nylon simple sutures.  Marcaine 0.25% with  epinephrine was infiltrated into the joint.  The wound was dressed sterilely  and secured with an Ace bandage. She was awakened without difficulty and  transported to the recovery room in satisfactory condition.   The patient tolerated the procedure well without complications.                                               Jene Every, M.D.    Cordelia Pen  D:  10/29/2002  T:  10/29/2002  Job:  [id number]

## 2010-06-10 NOTE — Op Note (Signed)
[facility]  Patient:    Alyssa Mccarthy, Alyssa Mccarthy [facility] Visit Number: [id number] MRN: [id number]          Service Type: DSU Location: [facility] Attending Physician:  Morene Antu Dictated by:   Sherry A. Rosalio Macadamia, M.D. Proc. Date: 10/24/00 Admit Date:  10/24/2000                             Operative Report  PREOPERATIVE DIAGNOSIS:       Left lower quadrant pain.  POSTOPERATIVE DIAGNOSIS:      Mild endometriosis.  PROCEDURE:                    Diagnostic laparoscopy with the YAG laser of                               mild endometriosis.  SURGEON:                      Sherry A. Rosalio Macadamia, M.D.  ANESTHESIA:                   General.  INDICATIONS:                  This is a 45 year old, G2, P2-0-0-2, woman who has had severe left lower quadrant pain for approximately one month. Prior to that, the patient had right perineal pain. The patient has had a complete GI workup with no abnormalities found. The patient had a previous laparoscopy with mild endometriosis seen at that time. Therefore, the patient is brought to the operating room for diagnostic laparoscopy with the YAG as necessary.  FINDINGS:                     Normal size globular uterus, normal ovaries, mild endometriosis on left ovary, left sidewall, and right uterosacral ligament. Right cul-de-sac pockets and puckering of peritoneum with adhesions to the sigmoid rectal tissue with mild endometriosis present.  DESCRIPTION OF PROCEDURE:     The patient was brought into the operating room and given adequate general anesthesia. She was placed in a dorsal lithotomy position. Her abdomen and perineum were washed with Betadine. The vagina was washed with Betadine. Foley catheter was inserted into the bladder. Pelvic examination was performed. Speculum was placed in the vagina. The anterior lip of the cervix was grasped with a single tooth tenaculum. A single tooth Hulka tenaculum was placed in the  anterior cervical canal in a retroverted fashion and swung anteriorly. First tenaculum and speculum were removed. The surgeons gown and gloves were changed. The patient was then draped in sterile fashion. Using Marcaine 0.25%, a subumbilical area was infiltrated and incision was made. A Veress needle was introduced into the peritoneal cavity. Its placement was checked with saline. Approximately 3 liters carbon dioxide was insufflated. Veress needle was then removed. Laparoscopic trocar was easily introduced into the peritoneal cavity. Positive identification of pelvic organs were made. A right suprapubic incision was made after infiltrating with 0.25% Marcaine. This was brought down under direct visualization into the peritoneal cavity. The pelvis was inspected with the above findings. Using YAG laser with a rounded tip at 15 watts power, the endometriotic implants were lasered except for the puckering of the peritoneum right next to the rectal tissues. This was felt to be dangerous to laser and no lasering right over  the right ureter near the uterosacral ligament was performed. The upper abdomen was inspected. The gallbladder appeared to be normal. All other organs appeared to be normal. The small amount of adhesions from the left colon to the left sidewall-these were lasered off the sidewall. The whole area was inspected and felt to be normal. It was irrigated with warm Ringers lactate solution and suctioned free. The suprapubic trocar was removed under direct visualization, both with the gas insufflated and with the gas released. There was no significant bleeding in this area. All carbon dioxide was allowed to escape. The laparoscope and sleeve were removed under direct visualization. The subumbilical incision was closed deeply at or near the fascia with O Vicryl interrupted stitch. Adequate hemostasis was present. The incisions were washed and dried. Using Dermabond, the two incisions  were closed. The Foley catheter and tenaculum were removed from the bladder and the vagina. The patient was taken out of the dorsal lithotomy position. She was awakened. She was extubated. She was moved from the operating table to the stretcher in stable condition. Complications were none. Estimated blood loss was less than 5 cc. Dictated by:   Sherry A. Rosalio Macadamia, M.D. Attending Physician:  Morene Antu DD:  10/24/00 TD:  10/24/00 Job: [id number]

## 2010-06-10 NOTE — Op Note (Signed)
[facility]  Patient:    Alyssa Mccarthy, Alyssa Mccarthy                         MRN: [id number] Proc. Date: 06/29/00 Adm. Date:  [id number] Attending:  Bonnetta Barry                           Operative Report  PREOPERATIVE DIAGNOSIS:  Fistula in ano.  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  PROCEDURE:  Fistulotomy with placement of Seton (0 Prolene).  SURGEON:  Velora Heckler, M.D.  ANESTHESIA:  General.  ESTIMATED BLOOD LOSS:  Minimal.  PREPARATION:  Betadine.  COMPLICATIONS:  None.  INDICATIONS:  The patient is a 45 year old white female, well-known to my surgical practice.  She has been noting bleeding associated with bowel movements for approximately six months on a regular basis.  She had a full colonoscopy by Dr. Bernette Redbird in November 2001, which was normal.  The patient was seen in the office and evaluated, and found to have a fistulous tract in the left posterior anus.  She now comes to the OR for exam under anesthesia and fistulotomy.  DESCRIPTION OF PROCEDURE:  The procedure was done in OR #6 at the [facility].  The patient was brought to the operating room and placed in the supine position on the operating room table.  Following the administration of general anesthesia, the patient was placed in lithotomy, prepped and draped in the usual strict aseptic fashion.  After ascertaining that an adequate level of anesthesia had been obtained, digital rectal exam was performed.  Anoscopy is performed.  A large anal speculum was then placed.  There is a fistulous tract in the posterior midline.  There is an opening on the skin on the left medial buttock.  A blunt probe was easily passed through the tract.  It does however go transsphincteric, involving the external sphincter muscle.  The anoderm is incised.  The fistulous tract is curetted and debrided and cauterized with the electrocautery.  The external  sphincter muscle was skeletonized.  A 0 Prolene suture was doubly passed through the tract and tied securely.  Tails were left long.  Anal speculum was again used to examine the entire anal canal which was otherwise normal with minimal internal hemorrhoids and no signs of fissure. Rectal mucosa is normal.  Local anesthetic is injected copiously at the operative site.  Dry gauze dressings are placed.  The patient was taken out of lithotomy and awakened from anesthesia.  The patient was brought to the recovery room in stable condition.  The patient tolerated the procedure well. DD:  06/29/00 TD:  06/30/00 Job: [id number]

## 2010-06-10 NOTE — Procedures (Signed)
[facility]  Patient:    Alyssa Mccarthy, Alyssa Mccarthy                         MRN: [id number] Proc. Date: 12/22/99 Adm. Date:  [id number] Disc. Date: [id number] Attending:  Donnetta Hutching CC:         Tera Mater. Evlyn Kanner, M.D.  Sung Amabile Roslyn Smiling, M.D.  Velora Heckler, M.D.   Procedure Report  PROCEDURE PERFORMED:  Colonoscopy with biopsies.  ENDOSCOPIST:  Florencia Reasons, M.D.  INDICATIONS FOR PROCEDURE:  The patient is a 45 year old female with persistent rectal bleeding with most bowel movements.  FINDINGS:  Normal exam to the terminal ileum except internal hemorrhoids.  DESCRIPTION OF PROCEDURE:  The nature, purpose and risks of the procedure had been discussed with the patient, who provided written consent.  Sedation was fentanyl 75 mcg and Versed 9 mg IV without arrhythmias or desaturation.  The Olympus pediatric video colonoscope was advanced with some looping (the adult scope would probably have worked better) but we were able to reach the terminal ileum by having the patient in the supine and then right lateral decubitus position.  TI biopsies and random mucosal biopsies were obtained during pullback.  The quality of the prep was excellent and it is felt that all areas were well seen.  This was a normal examination except for some internal hemorrhoids during pullout through the anal canal.  No polyps, cancer, colitis or vascular malformations or diverticular disease were appreciated.  The patient tolerated the procedure well and there were no apparent complications.  IMPRESSION:  Mild to moderate internal hemorrhoids, otherwise normal exam.  PLAN:  Await pathology and random mucosal biopsies in view of the patients history of apparent irritable bowel syndrome. DD:  12/22/99 TD:  12/22/99 Job: [id number]

## 2010-10-12 LAB — TYPE AND SCREEN
ABO/RH(D): O POS
Antibody Screen: NEGATIVE

## 2010-10-12 LAB — URINALYSIS, ROUTINE W REFLEX MICROSCOPIC
Glucose, UA: NEGATIVE
Ketones, ur: NEGATIVE
Nitrite: NEGATIVE
pH: 6

## 2010-10-12 LAB — CBC
HCT: 38.4
Platelets: 175
RDW: 13.3
WBC: 4.3

## 2010-10-12 LAB — DIFFERENTIAL
Basophils Absolute: 0
Eosinophils Relative: 1
Lymphocytes Relative: 34
Lymphs Abs: 1.4
Neutro Abs: 2.5
Neutrophils Relative %: 58

## 2010-10-12 LAB — BASIC METABOLIC PANEL
BUN: 12
Calcium: 10.3
Creatinine, Ser: 0.73
GFR calc non Af Amer: >60
Glucose, Bld: 105 — ABNORMAL HIGH
Potassium: 5.1

## 2010-10-12 LAB — ABO/RH: ABO/RH(D): O POS

## 2010-10-13 LAB — CBC
HCT: 30.3 — ABNORMAL LOW
MCV: 90.2
Platelets: 168
RDW: 13.1

## 2010-10-13 LAB — BASIC METABOLIC PANEL
BUN: 7
Chloride: 105
Creatinine, Ser: 0.77
GFR calc non Af Amer: >60
Glucose, Bld: 109 — ABNORMAL HIGH
Potassium: 4.2

## 2010-10-13 LAB — PROTIME-INR: INR: 1

## 2010-10-17 LAB — I-STAT 8, (EC8 V) (CONVERTED LAB)
Acid-base deficit: 2
HCT: 41
Hemoglobin: 13.9
Operator id: 265201
Potassium: 3.7
Sodium: 139
TCO2: 24
pH, Ven: 7.376 — ABNORMAL HIGH

## 2010-10-17 LAB — DIFFERENTIAL
Basophils Absolute: 0
Lymphocytes Relative: 21
Lymphs Abs: 1.2
Neutro Abs: 4.2
Neutrophils Relative %: 74

## 2010-10-17 LAB — VALPROIC ACID LEVEL: Valproic Acid Lvl: <10 — ABNORMAL LOW

## 2010-10-17 LAB — CBC
Platelets: 175
RDW: 14.1
WBC: 5.7

## 2010-10-20 LAB — POCT I-STAT, CHEM 8
Calcium, Ion: 1.13
Creatinine, Ser: 0.9
Glucose, Bld: 143 — ABNORMAL HIGH
Hemoglobin: 12.6
Potassium: 3.2 — ABNORMAL LOW

## 2010-11-16 ENCOUNTER — Ambulatory Visit (INDEPENDENT_AMBULATORY_CARE_PROVIDER_SITE_OTHER): Payer: 59

## 2010-11-16 ENCOUNTER — Inpatient Hospital Stay (INDEPENDENT_AMBULATORY_CARE_PROVIDER_SITE_OTHER)
Admission: RE | Admit: 2010-11-16 | Discharge: 2010-11-16 | Disposition: A | Discharge status: Home / Self Care | Payer: 59 | Source: Ambulatory Visit | Attending: Emergency Medicine | Admitting: Emergency Medicine

## 2010-11-16 DIAGNOSIS — S335XXA Sprain of ligaments of lumbar spine, initial encounter: Secondary | ICD-10-CM

## 2010-11-25 ENCOUNTER — Other Ambulatory Visit (HOSPITAL_COMMUNITY): Payer: Self-pay | Admitting: Specialist

## 2010-11-25 DIAGNOSIS — M545 Low back pain: Secondary | ICD-10-CM

## 2010-12-01 ENCOUNTER — Ambulatory Visit (HOSPITAL_COMMUNITY)
Admission: RE | Admit: 2010-12-01 | Discharge: 2010-12-01 | Disposition: A | Discharge status: Home / Self Care | Payer: 59 | Source: Ambulatory Visit | Attending: Specialist | Admitting: Specialist

## 2010-12-01 DIAGNOSIS — M545 Low back pain, unspecified: Secondary | ICD-10-CM | POA: Insufficient documentation

## 2010-12-01 DIAGNOSIS — M79609 Pain in unspecified limb: Secondary | ICD-10-CM | POA: Insufficient documentation

## 2010-12-01 DIAGNOSIS — R209 Unspecified disturbances of skin sensation: Secondary | ICD-10-CM | POA: Insufficient documentation

## 2010-12-01 DIAGNOSIS — M51379 Other intervertebral disc degeneration, lumbosacral region without mention of lumbar back pain or lower extremity pain: Secondary | ICD-10-CM | POA: Insufficient documentation

## 2010-12-01 DIAGNOSIS — M5137 Other intervertebral disc degeneration, lumbosacral region: Secondary | ICD-10-CM | POA: Insufficient documentation

## 2011-09-15 ENCOUNTER — Other Ambulatory Visit: Payer: Self-pay | Admitting: Endocrinology

## 2011-09-15 DIAGNOSIS — Z1231 Encounter for screening mammogram for malignant neoplasm of breast: Secondary | ICD-10-CM

## 2011-10-02 ENCOUNTER — Ambulatory Visit
Admission: RE | Admit: 2011-10-02 | Discharge: 2011-10-02 | Disposition: A | Discharge status: Home / Self Care | Payer: 59 | Source: Ambulatory Visit | Attending: Endocrinology | Admitting: Endocrinology

## 2011-10-02 DIAGNOSIS — Z1231 Encounter for screening mammogram for malignant neoplasm of breast: Secondary | ICD-10-CM

## 2012-07-25 ENCOUNTER — Other Ambulatory Visit: Payer: Self-pay | Admitting: Dermatology

## 2013-01-23 HISTORY — PX: BRAIN SURGERY: SHX531

## 2013-06-24 ENCOUNTER — Other Ambulatory Visit: Payer: Self-pay

## 2013-06-24 DIAGNOSIS — Z1231 Encounter for screening mammogram for malignant neoplasm of breast: Secondary | ICD-10-CM

## 2013-07-08 ENCOUNTER — Ambulatory Visit
Admission: RE | Admit: 2013-07-08 | Discharge: 2013-07-08 | Disposition: A | Discharge status: Home / Self Care | Payer: 59 | Source: Ambulatory Visit

## 2013-07-08 DIAGNOSIS — Z1231 Encounter for screening mammogram for malignant neoplasm of breast: Secondary | ICD-10-CM

## 2014-01-13 ENCOUNTER — Encounter: Payer: Self-pay | Admitting: Dietician

## 2014-01-13 ENCOUNTER — Encounter: Payer: 59 | Attending: Endocrinology | Admitting: Dietician

## 2014-01-13 VITALS — Ht 62.0 in | Wt 159.3 lb

## 2014-01-13 DIAGNOSIS — Z713 Dietary counseling and surveillance: Secondary | ICD-10-CM | POA: Insufficient documentation

## 2014-01-13 DIAGNOSIS — E780 Pure hypercholesterolemia: Secondary | ICD-10-CM | POA: Diagnosis present

## 2014-01-13 DIAGNOSIS — E785 Hyperlipidemia, unspecified: Secondary | ICD-10-CM

## 2014-01-13 NOTE — Progress Notes (Signed)
  Medical Nutrition Therapy:  Appt start time: 0815 end time:  0900.   Assessment:  Primary concerns today: Alyssa Mccarthy is here since she has a family hx of heart disease and she is concerned that her cholesterol is elevated. Taking Crestor once per week. Total cholesterol is 279 and LDL is 205 at the end of October. Was taking Crestor every day in the past and after running out decided to not take it since she had other medical issues.   Doctor would like her to lose weight (down to 150 lbs). Recently she lost 3-4 lbs and then regained 6 lbs. Has not made any changes to her diet recently. 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 She lives with her husband and 2 children. Husband does the food shopping and she does the cooking. She does not eat breakfast and sometimes will forget to eat lunch or have something very small. Sometimes just eats dinner. Eats out about 5 meals per week.  Would like to work on eating more frequently and healthy.    Preferred Learning Style:   No preference indicated   Learning Readiness:   Ready  MEDICATIONS: Crestor   DIETARY INTAKE:  Usual eating pattern includes 1-2 meals and 0-2 snacks per day.  Avoided foods include: eggs, brussels sprouts, turnip greens  24-hr recall:  B ( AM): nothing to eat, unsweet tea and black coffee Snk ( AM): none or yogurt L ( PM): cafeteria 2 vegetables, entree (meat), strawberry cobbler or Lean Cuisine with yogurt (might have before or after lunch) Snk ( PM): none or yogurt  D ( PM): pasta with broccoli, corn, green beans  Snk ( PM): might have pie, nothing usually  Beverages: water, unsweet tea, coffee  Usual physical activity: walking and gym (bike) for 30 minutes and weights 2-3 x week   Estimated energy needs: 1600 calories 180 g carbohydrates 120 g protein 44 g fat  Progress Towards Goal(s):  In progress.   Nutritional Diagnosis:  NB-1.1 Food and nutrition-related knowledge deficit As related  to elevated cholesterol levels.  As evidenced by diet recall indicating excess carbohydrates and meal skipping.    Intervention:  Nutrition counseling provided. Plan: Aim to eat at least 3 x day. Have a protein and carbohydrate at each meal and snack. For breakfast try a Premier Protein shake and a banana, [employer] Protein bar, or nuts with fruit, or a yogurt.  For lunch and dinner, increase vegetables and limit starch to a quarter of your plate. Add fiber to meals such as vegetables, fruit, and whole grains.  Continue to exercise (weights and cardio) several times per week.   Teaching Method Utilized:  Visual Auditory Hands on  Handouts given during visit include:  MyPlate Handout  15 g CHO Snacks  Barriers to learning/adherence to lifestyle change: none  Demonstrated degree of understanding via:  Teach Back   Monitoring/Evaluation:  Dietary intake, exercise, and body weight in 2 month(s).

## 2014-01-13 NOTE — Patient Instructions (Signed)
Aim to eat at least 3 x day. Have a protein and carbohydrate at each meal and snack. For breakfast try a Premier Protein shake and a banana, [employer] Protein bar, or nuts with fruit, or a yogurt.  For lunch and dinner, increase vegetables and limit starch to a quarter of your plate. Add fiber to meals such as vegetables, fruit, and whole grains.  Continue to exercise (weights and cardio) several times per week.

## 2014-03-16 ENCOUNTER — Encounter: Payer: 59 | Attending: Endocrinology | Admitting: Dietician

## 2014-03-16 VITALS — Ht 62.0 in | Wt 163.0 lb

## 2014-03-16 DIAGNOSIS — Z713 Dietary counseling and surveillance: Secondary | ICD-10-CM | POA: Insufficient documentation

## 2014-03-16 DIAGNOSIS — E780 Pure hypercholesterolemia: Secondary | ICD-10-CM | POA: Insufficient documentation

## 2014-03-16 DIAGNOSIS — E785 Hyperlipidemia, unspecified: Secondary | ICD-10-CM

## 2014-03-16 NOTE — Progress Notes (Signed)
  Medical Nutrition Therapy:  Appt start time: 3151 end time:  1005.   Assessment:  Primary concerns today: Alyssa Mccarthy is here for a follow up for high cholesterol. Has not had more blood work done since October. Thinks that things have been going well. Added more fruits and vegetables to her diet. Trying to add more exercise. Has been eating breakfast now. Feeling discouraged since her weight has gone and feel like "maybe she shouldn't eat".  Weight has gone up about 4 lbs since last visit.   Is not eating bread or pasta. Still having some sweets, though cut back. Feels like she is more aware of portion sizes. Using a small plate at meals.   Feels like clothes are looser even though she is gaining weight. Having a flare of IBS in the last 7 months though not feeling bloating anymore.   Wt Readings from Last 3 Encounters:  03/16/14 163 lb (73.936 kg)  01/13/14 159 lb 4.8 oz (72.258 kg)   Ht Readings from Last 3 Encounters:  03/16/14 5\' 2"  (1.575 m)  01/13/14 5\' 2"  (1.575 m)   Body mass index is 29.81 kg/(m^2). @BMIFA @ Normalized weight-for-age data available only for age 41 to 47 years. Normalized stature-for-age data available only for age 41 to 47 years.  Would like to work on eating more frequently and healthy.    Preferred Learning Style:   No preference indicated   Learning Readiness:   Ready  MEDICATIONS: Crestor   DIETARY INTAKE:  Usual eating pattern includes 1-2 meals and 0-2 snacks per day.  Avoided foods include: eggs, brussels sprouts, turnip greens  24-hr recall:  B ( AM): [employer] unsweet tea and black coffee Snk ( AM): none  L ( PM): cafeteria 2 vegetables, entree (meat), strawberry cobbler or Lean Cuisine with yogurt (might have before or after lunch) Snk ( PM): none  D ( PM): meat with peas, broccoli, corn, green beans  Snk ( PM): might have pie, nothing usually  Beverages: water, unsweet tea, coffee  Usual physical activity: walking and  gym (bike) for 30 minutes and weights, water aerboics 3 x week (in the last few week)  Estimated energy needs: 1600 calories 180 g carbohydrates 120 g protein 44 g fat  Progress Towards Goal(s):  In progress.   Nutritional Diagnosis:  NB-1.1 Food and nutrition-related knowledge deficit As related to elevated cholesterol levels.  As evidenced by diet recall indicating excess carbohydrates and meal skipping.    Intervention:  Nutrition counseling provided. Plan: Continue to eat at least 3 x day. Have a protein and carbohydrate at each meal and snack. For lunch and dinner, increase vegetables and limit starch to a quarter of your plate. Add fiber to meals such as vegetables, fruit, and whole grains.  Increase exercise (weights and cardio) 3-4 x times per week.    Teaching Method Utilized:  Visual Auditory Hands on  Handouts given during visit include:  MyPlate Handout  15 g CHO Snacks  Barriers to learning/adherence to lifestyle change: none  Demonstrated degree of understanding via:  Teach Back   Monitoring/Evaluation:  Dietary intake, exercise, and body weight prn.

## 2014-03-16 NOTE — Patient Instructions (Addendum)
Continue to eat at least 3 x day. Have a protein and carbohydrate at each meal and snack. For lunch and dinner, increase vegetables and limit starch to a quarter of your plate. Add fiber to meals such as vegetables, fruit, and whole grains.  Increase exercise (weights and cardio) 3-4 x times per week.

## 2014-04-26 ENCOUNTER — Encounter (HOSPITAL_COMMUNITY): Payer: Self-pay | Admitting: Emergency Medicine

## 2014-04-26 ENCOUNTER — Emergency Department (HOSPITAL_COMMUNITY)
Admission: EM | Admit: 2014-04-26 | Discharge: 2014-04-26 | Disposition: A | Discharge status: Home / Self Care | Payer: 59 | Source: Home / Self Care | Attending: Family Medicine | Admitting: Family Medicine

## 2014-04-26 DIAGNOSIS — J301 Allergic rhinitis due to pollen: Secondary | ICD-10-CM | POA: Diagnosis not present

## 2014-04-26 MED ORDER — IPRATROPIUM BROMIDE 0.06 % NA SOLN: 2.0000 | Freq: Four times a day (QID) | NASAL | Status: DC

## 2014-04-26 MED ORDER — PREDNISONE 20 MG PO TABS: ORAL_TABLET | ORAL | Status: DC

## 2014-04-26 NOTE — Discharge Instructions (Signed)
Allergic Rhinitis Flonase or rhinocort nasal spray Zyrtec or Allegra  Lots of saline nasal spray. atrovent ns as needed Allergic rhinitis is when the mucous membranes in the nose respond to allergens. Allergens are particles in the air that cause your body to have an allergic reaction. This causes you to release allergic antibodies. Through a chain of events, these eventually cause you to release histamine into the blood stream. Although meant to protect the body, it is this release of histamine that causes your discomfort, such as frequent sneezing, congestion, and an itchy, runny nose.  CAUSES  Seasonal allergic rhinitis (hay fever) is caused by pollen allergens that may come from grasses, trees, and weeds. Year-round allergic rhinitis (perennial allergic rhinitis) is caused by allergens such as house dust mites, pet dander, and mold spores.  SYMPTOMS   Nasal stuffiness (congestion).  Itchy, runny nose with sneezing and tearing of the eyes. DIAGNOSIS  Your health care provider can help you determine the allergen or allergens that trigger your symptoms. If you and your health care provider are unable to determine the allergen, skin or blood testing may be used. TREATMENT  Allergic rhinitis does not have a cure, but it can be controlled by:  Medicines and allergy shots (immunotherapy).  Avoiding the allergen. Hay fever may often be treated with antihistamines in pill or nasal spray forms. Antihistamines block the effects of histamine. There are over-the-counter medicines that may help with nasal congestion and swelling around the eyes. Check with your health care provider before taking or giving this medicine.  If avoiding the allergen or the medicine prescribed do not work, there are many new medicines your health care provider can prescribe. Stronger medicine may be used if initial measures are ineffective. Desensitizing injections can be used if medicine and avoidance does not work.  Desensitization is when a patient is given ongoing shots until the body becomes less sensitive to the allergen. Make sure you follow up with your health care provider if problems continue. HOME CARE INSTRUCTIONS It is not possible to completely avoid allergens, but you can reduce your symptoms by taking steps to limit your exposure to them. It helps to know exactly what you are allergic to so that you can avoid your specific triggers. SEEK MEDICAL CARE IF:   You have a fever.  You develop a cough that does not stop easily (persistent).  You have shortness of breath.  You start wheezing.  Symptoms interfere with normal daily activities. Document Released: 10/04/2000 Document Revised: 01/14/2013 Document Reviewed: 09/16/2012 [facility] Patient Information 2015 [place], [place]. This information is not intended to replace advice given to you by your health care provider. Make sure you discuss any questions you have with your health care provider.

## 2014-04-26 NOTE — ED Provider Notes (Signed)
CSN: [id number]     Arrival date & time 04/26/14  0902 History   First MD Initiated Contact with Patient 04/26/14 0930     Chief Complaint  Patient presents with  . Cough   (Consider location/radiation/quality/duration/timing/severity/associated sxs/prior Treatment) HPI Comments: The 49 year old female complaining of a productive cough, yellow and green PND and a loss of voice. Denies fever. Symptoms started 3-4 days ago. She has a history of seasonal allergies.   Past Medical History  Diagnosis Date  . Hyperlipidemia    Past Surgical History  Procedure Laterality Date  . Total knee arthroplasty    . Abdominal hysterectomy    . Appendectomy    . Carpal tunnel release    . Brain surgery     History reviewed. No pertinent family history. History  Substance Use Topics  . Smoking status: Never Smoker   . Smokeless tobacco: Not on file  . Alcohol Use: No   OB History    No data available     Review of Systems  Constitutional: Negative.  Negative for fever and fatigue.  HENT: Positive for congestion, postnasal drip and rhinorrhea. Negative for ear pain.   Eyes: Negative.   Respiratory: Positive for cough. Negative for shortness of breath and wheezing.   Cardiovascular: Negative.   Gastrointestinal: Negative.   Genitourinary: Negative.   Neurological: Negative.     Allergies  Contrast media; Penicillins; and Plavix  Home Medications   Prior to Admission medications   Medication Sig Start Date End Date Taking? Authorizing Provider  divalproex (DEPAKOTE) 500 MG DR tablet Take 500 mg by mouth 3 (three) times daily.    Historical Provider, MD  ipratropium (ATROVENT) 0.06 % nasal spray Place 2 sprays into both nostrils 4 (four) times daily. 04/26/14   Janne Napoleon, NP  lamoTRIgine (LAMICTAL) 100 MG tablet Take 100 mg by mouth daily.    Historical Provider, MD  levETIRAcetam (KEPPRA) 1000 MG tablet Take 2,500 mg by mouth once.    Historical Provider, MD  levothyroxine  (SYNTHROID, LEVOTHROID) 75 MCG tablet Take 75 mcg by mouth daily before breakfast.    Historical Provider, MD  predniSONE (DELTASONE) 20 MG tablet Take 3 tabs po on first day, 2 tabs second day, 2 tabs third day, 1 tab fourth day, 1 tab 5th day. Take with food. 04/26/14   Janne Napoleon, NP  primidone (MYSOLINE) 50 MG tablet Take by mouth 4 (four) times daily.    Historical Provider, MD  Probiotic Product (ALIGN PO) Take by mouth.    Historical Provider, MD  propranolol (INDERAL) 60 MG tablet Take 60 mg by mouth 3 (three) times daily.    Historical Provider, MD  sertraline (ZOLOFT) 100 MG tablet Take 100 mg by mouth daily.    Historical Provider, MD  zolpidem (AMBIEN) 10 MG tablet Take 10 mg by mouth at bedtime as needed for sleep.    Historical Provider, MD   BP 115/73 mmHg  Pulse 60  Temp(Src) 97.6 F (36.4 C) (Oral)  Resp 16  SpO2 95% Physical Exam  Constitutional: She is oriented to person, place, and time. She appears well-developed and well-nourished. No distress.  HENT:  Mouth/Throat: No oropharyngeal exudate.  Bilateral TMs are normal Oropharynx with erythema and cobblestoning with clear PND  Eyes:  Minor erythema to the lower lid conjunctiva  Neck: Normal range of motion. Neck supple.  Cardiovascular: Normal rate, regular rhythm and normal heart sounds.   Pulmonary/Chest: Effort normal and breath sounds normal. No respiratory distress. She  has no rales.  Lymphadenopathy:    She has no cervical adenopathy.  Neurological: She is alert and oriented to person, place, and time.  Skin: Skin is dry.  Psychiatric: She has a normal mood and affect.  Nursing note and vitals reviewed.   ED Course  Procedures (including critical care time) Labs Review Labs Reviewed - No data to display  Imaging Review No results found.   MDM   1. Allergic rhinitis due to pollen      Allergic Rhinitis Flonase or rhinocort nasal spray Zyrtec or Allegra  Lots of saline nasal spray. atrovent ns  as needed    Janne Napoleon, NP 04/26/14 [phone]

## 2014-04-26 NOTE — ED Notes (Signed)
Pt states that she has had a productive cough since 04/23/2014, pt is in no acute distress this morning

## 2015-01-26 MED FILL — ZOLPIDEM TARTRATE 10 MG TAB: 10 | 30 days supply | Qty: 30 | Fill #3

## 2015-01-27 MED FILL — LaMICtal 100 MG TABS: 100 | 30 days supply | Qty: 60 | Fill #4

## 2015-01-28 DIAGNOSIS — F321 Major depressive disorder, single episode, moderate: Secondary | ICD-10-CM | POA: Diagnosis not present

## 2015-02-02 MED FILL — PRIMIDONE 50 MG TABLET: 50 | 30 days supply | Qty: 150 | Fill #0

## 2015-02-02 MED FILL — KEPPRA XR 500 MG TABLET: 500 | 30 days supply | Qty: 150 | Fill #10

## 2015-02-02 MED FILL — DICYCLOMINE 10 MG CAPSULE: 10 | 10 days supply | Qty: 60 | Fill #4

## 2015-02-25 MED FILL — ZOLPIDEM TARTRATE 10 MG TAB: 10 | 30 days supply | Qty: 30 | Fill #4

## 2015-02-25 MED FILL — clonazePAM 0.5 MG TABS: 0.5 | 30 days supply | Qty: 60 | Fill #0

## 2015-03-05 MED FILL — DICYCLOMINE 10 MG CAPSULE: 10 | 10 days supply | Qty: 60 | Fill #5

## 2015-03-05 MED FILL — KEPPRA XR 500 MG TABLET: 500 | 30 days supply | Qty: 150 | Fill #11

## 2015-03-05 MED FILL — ATORVASTATIN 20 MG TABLET: 20 | 90 days supply | Qty: 90 | Fill #1

## 2015-03-05 MED FILL — SERTRALINE HCL 50 MG TABLET: 50 | 90 days supply | Qty: 180 | Fill #3

## 2015-03-08 MED FILL — DIVALPROEX SOD ER 500 MG TA: 500 | 90 days supply | Qty: 90 | Fill #0

## 2015-03-09 DIAGNOSIS — Z888 Allergy status to other drugs, medicaments and biological substances status: Secondary | ICD-10-CM | POA: Diagnosis not present

## 2015-03-09 DIAGNOSIS — G250 Essential tremor: Secondary | ICD-10-CM | POA: Diagnosis not present

## 2015-03-09 DIAGNOSIS — Z9689 Presence of other specified functional implants: Secondary | ICD-10-CM | POA: Diagnosis not present

## 2015-03-09 DIAGNOSIS — I10 Essential (primary) hypertension: Secondary | ICD-10-CM | POA: Diagnosis not present

## 2015-03-09 DIAGNOSIS — Z880 Allergy status to penicillin: Secondary | ICD-10-CM | POA: Diagnosis not present

## 2015-03-09 DIAGNOSIS — Z79899 Other long term (current) drug therapy: Secondary | ICD-10-CM | POA: Diagnosis not present

## 2015-03-09 DIAGNOSIS — E039 Hypothyroidism, unspecified: Secondary | ICD-10-CM | POA: Diagnosis not present

## 2015-03-10 MED FILL — lamoTRIgine 100 MG TABS: 100 | 90 days supply | Qty: 180 | Fill #0

## 2015-03-16 MED FILL — PRIMIDONE 50 MG TABLET: 50 | 30 days supply | Qty: 150 | Fill #1

## 2015-03-25 DIAGNOSIS — F321 Major depressive disorder, single episode, moderate: Secondary | ICD-10-CM | POA: Diagnosis not present

## 2015-03-26 MED FILL — ZOLPIDEM TARTRATE 10 MG TAB: 10 | 30 days supply | Qty: 30 | Fill #5

## 2015-03-30 DIAGNOSIS — G250 Essential tremor: Secondary | ICD-10-CM | POA: Diagnosis not present

## 2015-03-30 DIAGNOSIS — K580 Irritable bowel syndrome with diarrhea: Secondary | ICD-10-CM | POA: Diagnosis not present

## 2015-03-30 DIAGNOSIS — F419 Anxiety disorder, unspecified: Secondary | ICD-10-CM | POA: Diagnosis not present

## 2015-03-30 DIAGNOSIS — E039 Hypothyroidism, unspecified: Secondary | ICD-10-CM | POA: Diagnosis not present

## 2015-03-30 DIAGNOSIS — E785 Hyperlipidemia, unspecified: Secondary | ICD-10-CM | POA: Diagnosis not present

## 2015-03-30 DIAGNOSIS — K9041 Non-celiac gluten sensitivity: Secondary | ICD-10-CM | POA: Diagnosis not present

## 2015-03-30 DIAGNOSIS — F39 Unspecified mood [affective] disorder: Secondary | ICD-10-CM | POA: Diagnosis not present

## 2015-03-30 DIAGNOSIS — F329 Major depressive disorder, single episode, unspecified: Secondary | ICD-10-CM | POA: Diagnosis not present

## 2015-03-30 DIAGNOSIS — I10 Essential (primary) hypertension: Secondary | ICD-10-CM | POA: Diagnosis not present

## 2015-04-02 MED FILL — VIT D2 1.25 MG (50,000 UNIT: 1.25 MG | 84 days supply | Qty: 12 | Fill #0

## 2015-04-05 MED FILL — KEPPRA XR 500 MG TABLET: 500 | 30 days supply | Qty: 150 | Fill #0

## 2015-04-06 MED FILL — SYNTHROID 88 MCG TABLET: 88 | 90 days supply | Qty: 90 | Fill #1

## 2015-04-07 DIAGNOSIS — K580 Irritable bowel syndrome with diarrhea: Secondary | ICD-10-CM | POA: Diagnosis not present

## 2015-04-07 DIAGNOSIS — E039 Hypothyroidism, unspecified: Secondary | ICD-10-CM | POA: Diagnosis not present

## 2015-04-07 DIAGNOSIS — F419 Anxiety disorder, unspecified: Secondary | ICD-10-CM | POA: Diagnosis not present

## 2015-04-07 DIAGNOSIS — F329 Major depressive disorder, single episode, unspecified: Secondary | ICD-10-CM | POA: Diagnosis not present

## 2015-04-07 DIAGNOSIS — T85192A Other mechanical complication of implanted electronic neurostimulator (electrode) of spinal cord, initial encounter: Secondary | ICD-10-CM | POA: Diagnosis not present

## 2015-04-07 DIAGNOSIS — F39 Unspecified mood [affective] disorder: Secondary | ICD-10-CM | POA: Diagnosis not present

## 2015-04-07 DIAGNOSIS — I10 Essential (primary) hypertension: Secondary | ICD-10-CM | POA: Diagnosis not present

## 2015-04-07 DIAGNOSIS — K9041 Non-celiac gluten sensitivity: Secondary | ICD-10-CM | POA: Diagnosis not present

## 2015-04-07 DIAGNOSIS — E785 Hyperlipidemia, unspecified: Secondary | ICD-10-CM | POA: Diagnosis not present

## 2015-04-07 DIAGNOSIS — G250 Essential tremor: Secondary | ICD-10-CM | POA: Diagnosis not present

## 2015-04-08 DIAGNOSIS — K9041 Non-celiac gluten sensitivity: Secondary | ICD-10-CM | POA: Diagnosis not present

## 2015-04-08 DIAGNOSIS — E039 Hypothyroidism, unspecified: Secondary | ICD-10-CM | POA: Diagnosis not present

## 2015-04-08 DIAGNOSIS — E785 Hyperlipidemia, unspecified: Secondary | ICD-10-CM | POA: Diagnosis not present

## 2015-04-08 DIAGNOSIS — G250 Essential tremor: Secondary | ICD-10-CM | POA: Diagnosis not present

## 2015-04-08 DIAGNOSIS — I10 Essential (primary) hypertension: Secondary | ICD-10-CM | POA: Diagnosis not present

## 2015-04-08 DIAGNOSIS — F39 Unspecified mood [affective] disorder: Secondary | ICD-10-CM | POA: Diagnosis not present

## 2015-04-08 DIAGNOSIS — F329 Major depressive disorder, single episode, unspecified: Secondary | ICD-10-CM | POA: Diagnosis not present

## 2015-04-08 DIAGNOSIS — F419 Anxiety disorder, unspecified: Secondary | ICD-10-CM | POA: Diagnosis not present

## 2015-04-08 DIAGNOSIS — K580 Irritable bowel syndrome with diarrhea: Secondary | ICD-10-CM | POA: Diagnosis not present

## 2015-04-23 MED FILL — PRIMIDONE 50 MG TABLET: 50 | 30 days supply | Qty: 150 | Fill #2

## 2015-04-26 MED FILL — ZOLPIDEM TARTRATE 10 MG TAB: 10 | 30 days supply | Qty: 30 | Fill #0

## 2015-05-04 DIAGNOSIS — F321 Major depressive disorder, single episode, moderate: Secondary | ICD-10-CM | POA: Diagnosis not present

## 2015-05-07 MED FILL — KEPPRA XR 500 MG TABLET: 500 | 30 days supply | Qty: 150 | Fill #1

## 2015-05-10 DIAGNOSIS — E039 Hypothyroidism, unspecified: Secondary | ICD-10-CM | POA: Diagnosis not present

## 2015-05-10 DIAGNOSIS — Z79899 Other long term (current) drug therapy: Secondary | ICD-10-CM | POA: Diagnosis not present

## 2015-05-10 DIAGNOSIS — G40311 Generalized idiopathic epilepsy and epileptic syndromes, intractable, with status epilepticus: Secondary | ICD-10-CM | POA: Diagnosis not present

## 2015-05-10 DIAGNOSIS — I10 Essential (primary) hypertension: Secondary | ICD-10-CM | POA: Diagnosis not present

## 2015-05-10 DIAGNOSIS — Z5181 Encounter for therapeutic drug level monitoring: Secondary | ICD-10-CM | POA: Diagnosis not present

## 2015-05-10 DIAGNOSIS — Z9689 Presence of other specified functional implants: Secondary | ICD-10-CM | POA: Diagnosis not present

## 2015-05-10 DIAGNOSIS — G250 Essential tremor: Secondary | ICD-10-CM | POA: Diagnosis not present

## 2015-05-10 DIAGNOSIS — Z880 Allergy status to penicillin: Secondary | ICD-10-CM | POA: Diagnosis not present

## 2015-05-10 MED FILL — clonazePAM 0.5 MG TABS: 0.5 | 30 days supply | Qty: 60 | Fill #1

## 2015-05-27 MED FILL — ZOLPIDEM TARTRATE 10 MG TAB: 10 | 30 days supply | Qty: 30 | Fill #1

## 2015-06-02 DIAGNOSIS — F321 Major depressive disorder, single episode, moderate: Secondary | ICD-10-CM | POA: Diagnosis not present

## 2015-06-03 MED FILL — KEPPRA XR 500 MG TABLET: 500 | 30 days supply | Qty: 150 | Fill #2

## 2015-06-03 MED FILL — PRIMIDONE 50 MG TABLET: 50 | 30 days supply | Qty: 150 | Fill #3

## 2015-06-08 MED FILL — DIVALPROEX SOD ER 500 MG TA: 500 | 90 days supply | Qty: 90 | Fill #1

## 2015-06-09 MED FILL — lamoTRIgine 100 MG TABS: 100 | 90 days supply | Qty: 180 | Fill #1

## 2015-06-16 MED FILL — SERTRALINE HCL 50 MG TABLET: 50 | 90 days supply | Qty: 180 | Fill #0

## 2015-06-25 MED FILL — ZOLPIDEM TARTRATE 10 MG TAB: 10 | 30 days supply | Qty: 30 | Fill #2

## 2015-07-05 DIAGNOSIS — R569 Unspecified convulsions: Secondary | ICD-10-CM | POA: Diagnosis not present

## 2015-07-05 DIAGNOSIS — Z1389 Encounter for screening for other disorder: Secondary | ICD-10-CM | POA: Diagnosis not present

## 2015-07-05 DIAGNOSIS — M859 Disorder of bone density and structure, unspecified: Secondary | ICD-10-CM | POA: Diagnosis not present

## 2015-07-05 DIAGNOSIS — E784 Other hyperlipidemia: Secondary | ICD-10-CM | POA: Diagnosis not present

## 2015-07-05 DIAGNOSIS — I10 Essential (primary) hypertension: Secondary | ICD-10-CM | POA: Diagnosis not present

## 2015-07-05 DIAGNOSIS — R7301 Impaired fasting glucose: Secondary | ICD-10-CM | POA: Diagnosis not present

## 2015-07-05 DIAGNOSIS — K589 Irritable bowel syndrome without diarrhea: Secondary | ICD-10-CM | POA: Diagnosis not present

## 2015-07-05 DIAGNOSIS — E038 Other specified hypothyroidism: Secondary | ICD-10-CM | POA: Diagnosis not present

## 2015-07-05 DIAGNOSIS — Z6828 Body mass index (BMI) 28.0-28.9, adult: Secondary | ICD-10-CM | POA: Diagnosis not present

## 2015-07-05 MED FILL — VIT D2 1.25 MG (50,000 UNIT: 1.25 MG | 84 days supply | Qty: 12 | Fill #0

## 2015-07-08 DIAGNOSIS — F321 Major depressive disorder, single episode, moderate: Secondary | ICD-10-CM | POA: Diagnosis not present

## 2015-07-12 MED FILL — ATORVASTATIN 20 MG TABLET: 20 | 90 days supply | Qty: 90 | Fill #0

## 2015-07-12 MED FILL — KEPPRA XR 500 MG TABLET: 500 | 30 days supply | Qty: 150 | Fill #3

## 2015-07-14 MED FILL — PRIMIDONE 50 MG TABLET: 50 | 30 days supply | Qty: 150 | Fill #4

## 2015-07-19 DIAGNOSIS — Z0100 Encounter for examination of eyes and vision without abnormal findings: Secondary | ICD-10-CM | POA: Diagnosis not present

## 2015-07-19 MED FILL — SYNTHROID 88 MCG TABLET: 88 | 90 days supply | Qty: 90 | Fill #2

## 2015-07-28 MED FILL — ZOLPIDEM TARTRATE 10 MG TAB: 10 | 30 days supply | Qty: 30 | Fill #3

## 2015-08-02 DIAGNOSIS — F321 Major depressive disorder, single episode, moderate: Secondary | ICD-10-CM | POA: Diagnosis not present

## 2015-08-12 MED FILL — KEPPRA XR 500 MG TABLET: 500 | 30 days supply | Qty: 150 | Fill #4

## 2015-08-16 DIAGNOSIS — F321 Major depressive disorder, single episode, moderate: Secondary | ICD-10-CM | POA: Diagnosis not present

## 2015-08-17 ENCOUNTER — Encounter: Payer: Self-pay | Admitting: Pediatrics

## 2015-08-24 MED FILL — PRIMIDONE 50 MG TABLET: 50 | 30 days supply | Qty: 150 | Fill #5

## 2015-08-24 MED FILL — ZOLPIDEM TARTRATE 10 MG TAB: 10 | 30 days supply | Qty: 30 | Fill #4

## 2015-09-13 MED FILL — lamoTRIgine 100 MG TABS: 100 | 90 days supply | Qty: 180 | Fill #2

## 2015-09-13 MED FILL — LEVETIRACETAM ER 500 MG TAB: 500 | 90 days supply | Qty: 450 | Fill #0

## 2015-09-13 MED FILL — DIVALPROEX SOD ER 500 MG TA: 500 | 90 days supply | Qty: 90 | Fill #2

## 2015-09-13 MED FILL — SERTRALINE HCL 50 MG TABLET: 50 | 90 days supply | Qty: 180 | Fill #1

## 2015-09-16 DIAGNOSIS — M25562 Pain in left knee: Secondary | ICD-10-CM | POA: Diagnosis not present

## 2015-09-16 DIAGNOSIS — Z96652 Presence of left artificial knee joint: Secondary | ICD-10-CM | POA: Diagnosis not present

## 2015-09-16 DIAGNOSIS — Z471 Aftercare following joint replacement surgery: Secondary | ICD-10-CM | POA: Diagnosis not present

## 2015-09-16 DIAGNOSIS — M2392 Unspecified internal derangement of left knee: Secondary | ICD-10-CM | POA: Diagnosis not present

## 2015-09-21 DIAGNOSIS — Z9689 Presence of other specified functional implants: Secondary | ICD-10-CM | POA: Diagnosis not present

## 2015-09-21 DIAGNOSIS — G250 Essential tremor: Secondary | ICD-10-CM | POA: Diagnosis not present

## 2015-09-21 DIAGNOSIS — Z79899 Other long term (current) drug therapy: Secondary | ICD-10-CM | POA: Diagnosis not present

## 2015-09-21 DIAGNOSIS — Z888 Allergy status to other drugs, medicaments and biological substances status: Secondary | ICD-10-CM | POA: Diagnosis not present

## 2015-09-21 DIAGNOSIS — Z880 Allergy status to penicillin: Secondary | ICD-10-CM | POA: Diagnosis not present

## 2015-09-21 DIAGNOSIS — E039 Hypothyroidism, unspecified: Secondary | ICD-10-CM | POA: Diagnosis not present

## 2015-09-21 DIAGNOSIS — I10 Essential (primary) hypertension: Secondary | ICD-10-CM | POA: Diagnosis not present

## 2015-09-23 MED FILL — ZOLPIDEM TARTRATE 10 MG TAB: 10 | 30 days supply | Qty: 30 | Fill #5

## 2015-09-24 MED FILL — clonazePAM 0.5 MG TABS: 0.5 | 30 days supply | Qty: 60 | Fill #0

## 2015-09-30 MED FILL — PRIMIDONE 50 MG TABLET: 50 | 30 days supply | Qty: 150 | Fill #0

## 2015-10-06 DIAGNOSIS — G250 Essential tremor: Secondary | ICD-10-CM | POA: Diagnosis not present

## 2015-10-06 DIAGNOSIS — Z9689 Presence of other specified functional implants: Secondary | ICD-10-CM | POA: Diagnosis not present

## 2015-10-12 DIAGNOSIS — F321 Major depressive disorder, single episode, moderate: Secondary | ICD-10-CM | POA: Diagnosis not present

## 2015-10-22 MED FILL — SYNTHROID 88 MCG TABLET: 88 | 90 days supply | Qty: 90 | Fill #0

## 2015-10-25 MED FILL — ATORVASTATIN 20 MG TABLET: 20 | 60 days supply | Qty: 60 | Fill #1

## 2015-10-26 DIAGNOSIS — F321 Major depressive disorder, single episode, moderate: Secondary | ICD-10-CM | POA: Diagnosis not present

## 2015-10-26 MED FILL — ZOLPIDEM TARTRATE 10 MG TAB: 10 | 30 days supply | Qty: 30 | Fill #0

## 2015-10-28 DIAGNOSIS — Z96652 Presence of left artificial knee joint: Secondary | ICD-10-CM | POA: Diagnosis not present

## 2015-10-28 DIAGNOSIS — Z471 Aftercare following joint replacement surgery: Secondary | ICD-10-CM | POA: Diagnosis not present

## 2015-10-28 DIAGNOSIS — M25562 Pain in left knee: Secondary | ICD-10-CM | POA: Diagnosis not present

## 2015-11-01 MED FILL — PRIMIDONE 50 MG TABLET: 50 | 30 days supply | Qty: 150 | Fill #1

## 2015-11-09 DIAGNOSIS — F321 Major depressive disorder, single episode, moderate: Secondary | ICD-10-CM | POA: Diagnosis not present

## 2015-11-23 DIAGNOSIS — F321 Major depressive disorder, single episode, moderate: Secondary | ICD-10-CM | POA: Diagnosis not present

## 2015-11-25 MED FILL — ZOLPIDEM TARTRATE 10 MG TAB: 10 | 30 days supply | Qty: 30 | Fill #1

## 2015-11-29 ENCOUNTER — Ambulatory Visit (INDEPENDENT_AMBULATORY_CARE_PROVIDER_SITE_OTHER): Payer: 59 | Admitting: Podiatry

## 2015-11-29 ENCOUNTER — Encounter: Payer: Self-pay | Admitting: Podiatry

## 2015-11-29 VITALS — Ht 62.0 in | Wt 153.0 lb

## 2015-11-29 DIAGNOSIS — M76829 Posterior tibial tendinitis, unspecified leg: Secondary | ICD-10-CM

## 2015-11-29 DIAGNOSIS — M76822 Posterior tibial tendinitis, left leg: Principal | ICD-10-CM

## 2015-11-29 DIAGNOSIS — M79672 Pain in left foot: Secondary | ICD-10-CM | POA: Diagnosis not present

## 2015-11-29 DIAGNOSIS — M2141 Flat foot [pes planus] (acquired), right foot: Secondary | ICD-10-CM

## 2015-11-29 DIAGNOSIS — M2142 Flat foot [pes planus] (acquired), left foot: Secondary | ICD-10-CM | POA: Diagnosis not present

## 2015-11-29 DIAGNOSIS — M79671 Pain in right foot: Secondary | ICD-10-CM | POA: Diagnosis not present

## 2015-11-29 DIAGNOSIS — M76821 Posterior tibial tendinitis, right leg: Secondary | ICD-10-CM

## 2015-12-02 MED FILL — PRIMIDONE 50 MG TABLET: 50 | 30 days supply | Qty: 150 | Fill #2

## 2015-12-04 NOTE — Progress Notes (Signed)
Subjective:  Patient presents today for bilateral foot pain. Patient states that she's had 9 surgeries on her left knee. She currently has a in the knee arthroplasty. Patient states that she has pain all over her foot going on for several months. Patient presents today for further treatment and evaluation    Objective/Physical Exam General: The patient is alert and oriented x3 in no acute distress.  Dermatology: Skin is warm, dry and supple bilateral lower extremities. Negative for open lesions or macerations.  Vascular: Palpable pedal pulses bilaterally. No edema or erythema noted. Capillary refill within normal limits.  Neurological: Epicritic and protective threshold grossly intact bilaterally.   Musculoskeletal Exam: Pain on palpation to the posterior tibial tendon tract bilaterally. Pain with forced inversion bilateral feet. When the foot is loaded, there is collapse of the medial longitudinal arch with calcaneal valgus consistent with posterior tibial tendon dysfunction.  Assessment: #1 posterior tibial tendon dysfunction bilateral #2 bilateral foot pain #3 in the knee arthroplasty left #4 posterior tibial tendinitis bilateral  Plan of Care:  #1 Patient was evaluated. #2 today we discussed the conservative versus surgical management of posterior tibial tendon dysfunction. At the moment the patient is going to opt for conservative management. #3 today the patient was scanned for custom molded orthotics #4 return to clinic in 3 weeks for orthotic pickup   Dr. Edrick Kins, [place]

## 2015-12-07 DIAGNOSIS — F321 Major depressive disorder, single episode, moderate: Secondary | ICD-10-CM | POA: Diagnosis not present

## 2015-12-10 MED FILL — DIVALPROEX SOD ER 500 MG TA: 500 | 90 days supply | Qty: 90 | Fill #3

## 2015-12-13 MED FILL — LEVETIRACETAM ER 500 MG TAB: 500 | 90 days supply | Qty: 450 | Fill #1

## 2015-12-13 MED FILL — lamoTRIgine 100 MG TABS: 100 | 90 days supply | Qty: 180 | Fill #3

## 2015-12-21 ENCOUNTER — Ambulatory Visit: Payer: 59 | Admitting: Psychology

## 2015-12-22 ENCOUNTER — Ambulatory Visit (INDEPENDENT_AMBULATORY_CARE_PROVIDER_SITE_OTHER): Payer: 59 | Admitting: Podiatry

## 2015-12-22 DIAGNOSIS — M2141 Flat foot [pes planus] (acquired), right foot: Secondary | ICD-10-CM

## 2015-12-22 DIAGNOSIS — M2142 Flat foot [pes planus] (acquired), left foot: Secondary | ICD-10-CM

## 2015-12-22 DIAGNOSIS — M76829 Posterior tibial tendinitis, unspecified leg: Secondary | ICD-10-CM

## 2015-12-22 NOTE — Patient Instructions (Signed)

## 2015-12-23 MED FILL — SERTRALINE HCL 50 MG TABLET: 50 | 90 days supply | Qty: 180 | Fill #2

## 2015-12-23 NOTE — Progress Notes (Signed)
Patient presents for orthotic pick up.  Verbal and written break in and wear instructions given.  Patient will follow up in 4 weeks if symptoms worsen or fail to improve. 

## 2015-12-29 MED FILL — ATORVASTATIN 20 MG TABLET: 20 | 30 days supply | Qty: 30 | Fill #0

## 2015-12-29 MED FILL — ZOLPIDEM TARTRATE 10 MG TAB: 10 | 30 days supply | Qty: 30 | Fill #2

## 2016-01-03 DIAGNOSIS — H903 Sensorineural hearing loss, bilateral: Secondary | ICD-10-CM | POA: Diagnosis not present

## 2016-01-05 ENCOUNTER — Ambulatory Visit (INDEPENDENT_AMBULATORY_CARE_PROVIDER_SITE_OTHER): Payer: 59 | Admitting: Psychology

## 2016-01-05 DIAGNOSIS — F321 Major depressive disorder, single episode, moderate: Secondary | ICD-10-CM | POA: Diagnosis not present

## 2016-01-06 MED FILL — PRIMIDONE 50 MG TABLET: 50 | 30 days supply | Qty: 150 | Fill #3

## 2016-01-11 ENCOUNTER — Ambulatory Visit: Payer: Self-pay | Admitting: Psychology

## 2016-01-19 DIAGNOSIS — H903 Sensorineural hearing loss, bilateral: Secondary | ICD-10-CM | POA: Diagnosis not present

## 2016-01-19 DIAGNOSIS — Z461 Encounter for fitting and adjustment of hearing aid: Secondary | ICD-10-CM | POA: Diagnosis not present

## 2016-01-25 ENCOUNTER — Ambulatory Visit: Payer: 59 | Admitting: Psychology

## 2016-01-28 MED FILL — ZOLPIDEM TARTRATE 10 MG TAB: 10 | 30 days supply | Qty: 30 | Fill #3

## 2016-01-28 MED FILL — SYNTHROID 88 MCG TABLET: 88 | 90 days supply | Qty: 90 | Fill #1

## 2016-02-02 ENCOUNTER — Ambulatory Visit (INDEPENDENT_AMBULATORY_CARE_PROVIDER_SITE_OTHER): Payer: 59 | Admitting: Psychology

## 2016-02-02 DIAGNOSIS — F324 Major depressive disorder, single episode, in partial remission: Secondary | ICD-10-CM

## 2016-02-07 MED FILL — PRIMIDONE 50 MG TABLET: 50 | 30 days supply | Qty: 150 | Fill #4

## 2016-02-14 ENCOUNTER — Encounter (HOSPITAL_COMMUNITY): Payer: Self-pay | Admitting: Emergency Medicine

## 2016-02-14 ENCOUNTER — Emergency Department (HOSPITAL_COMMUNITY): Payer: 59

## 2016-02-14 ENCOUNTER — Emergency Department (HOSPITAL_COMMUNITY)
Admission: EM | Admit: 2016-02-14 | Discharge: 2016-02-14 | Disposition: A | Discharge status: Home / Self Care | Payer: 59 | Attending: Emergency Medicine | Admitting: Emergency Medicine

## 2016-02-14 DIAGNOSIS — R4781 Slurred speech: Secondary | ICD-10-CM | POA: Diagnosis not present

## 2016-02-14 DIAGNOSIS — Z5321 Procedure and treatment not carried out due to patient leaving prior to being seen by health care provider: Secondary | ICD-10-CM | POA: Insufficient documentation

## 2016-02-14 DIAGNOSIS — R51 Headache: Secondary | ICD-10-CM | POA: Insufficient documentation

## 2016-02-14 LAB — I-STAT CHEM 8, ED
BUN: 9 mg/dL (ref 6–20)
CALCIUM ION: 1.23 mmol/L (ref 1.15–1.40)
CHLORIDE: 102 mmol/L (ref 101–111)
Creatinine, Ser: 0.7 mg/dL (ref 0.44–1.00)
GLUCOSE: 105 mg/dL — AB (ref 65–99)
HCT: 38 % (ref 36.0–46.0)
HEMOGLOBIN: 12.9 g/dL (ref 12.0–15.0)
Potassium: 3.8 mmol/L (ref 3.5–5.1)
SODIUM: 142 mmol/L (ref 135–145)
TCO2: 28 mmol/L (ref 0–100)

## 2016-02-14 MED ORDER — IOPAMIDOL (ISOVUE-300) INJECTION 61%
75.0000 mL | Freq: Once | INTRAVENOUS | Status: AC | PRN
  Administered 2016-02-14: 75 mL via INTRAVENOUS

## 2016-02-14 MED ORDER — ONDANSETRON HCL 4 MG/2ML IJ SOLN
4.0000 mg | Freq: Once | INTRAMUSCULAR | Status: DC
  Filled 2016-02-14: qty 2

## 2016-02-14 MED ORDER — SODIUM CHLORIDE 0.9 % IV BOLUS (SEPSIS): 500.0000 mL | Freq: Once | INTRAVENOUS | Status: DC

## 2016-02-14 NOTE — ED Triage Notes (Signed)
Pt sent from neurological for outpatient  CT with constrast for severe headaches since yesterday 1200; unable to complete outpatient CT with contrast related to pt allergy to contrast media.

## 2016-02-14 NOTE — ED Notes (Signed)
Per Domingo Dimes charge nurse pt to wait in conference room while waiting for available room. Alyssa Mccarthy aware of pt and appropriate orders placed.

## 2016-02-14 NOTE — ED Notes (Signed)
This RN notified that after pts CT scan she left. Pt notified by radiology of normal CT and left before receiving further instructions from MD.

## 2016-02-15 ENCOUNTER — Ambulatory Visit: Payer: Self-pay | Admitting: Psychology

## 2016-02-17 ENCOUNTER — Emergency Department (HOSPITAL_COMMUNITY): Payer: 59

## 2016-02-17 ENCOUNTER — Observation Stay (HOSPITAL_COMMUNITY)
Admission: EM | Admit: 2016-02-17 | Discharge: 2016-02-18 | Disposition: A | Discharge status: Home / Self Care | Payer: 59 | Attending: Internal Medicine | Admitting: Internal Medicine

## 2016-02-17 ENCOUNTER — Encounter (HOSPITAL_COMMUNITY): Payer: Self-pay

## 2016-02-17 DIAGNOSIS — R202 Paresthesia of skin: Secondary | ICD-10-CM | POA: Diagnosis not present

## 2016-02-17 DIAGNOSIS — R519 Headache, unspecified: Secondary | ICD-10-CM | POA: Diagnosis present

## 2016-02-17 DIAGNOSIS — R200 Anesthesia of skin: Secondary | ICD-10-CM | POA: Diagnosis not present

## 2016-02-17 DIAGNOSIS — R2990 Unspecified symptoms and signs involving the nervous system: Secondary | ICD-10-CM | POA: Diagnosis present

## 2016-02-17 DIAGNOSIS — G40409 Other generalized epilepsy and epileptic syndromes, not intractable, without status epilepticus: Secondary | ICD-10-CM | POA: Diagnosis not present

## 2016-02-17 DIAGNOSIS — I639 Cerebral infarction, unspecified: Secondary | ICD-10-CM

## 2016-02-17 DIAGNOSIS — Z96659 Presence of unspecified artificial knee joint: Secondary | ICD-10-CM | POA: Insufficient documentation

## 2016-02-17 DIAGNOSIS — E785 Hyperlipidemia, unspecified: Secondary | ICD-10-CM | POA: Diagnosis present

## 2016-02-17 DIAGNOSIS — R29810 Facial weakness: Secondary | ICD-10-CM | POA: Diagnosis not present

## 2016-02-17 DIAGNOSIS — H5702 Anisocoria: Secondary | ICD-10-CM | POA: Diagnosis not present

## 2016-02-17 DIAGNOSIS — Z79899 Other long term (current) drug therapy: Secondary | ICD-10-CM | POA: Insufficient documentation

## 2016-02-17 DIAGNOSIS — G250 Essential tremor: Secondary | ICD-10-CM | POA: Diagnosis present

## 2016-02-17 DIAGNOSIS — I6330 Cerebral infarction due to thrombosis of unspecified cerebral artery: Secondary | ICD-10-CM | POA: Insufficient documentation

## 2016-02-17 DIAGNOSIS — G40909 Epilepsy, unspecified, not intractable, without status epilepticus: Secondary | ICD-10-CM

## 2016-02-17 DIAGNOSIS — I10 Essential (primary) hypertension: Secondary | ICD-10-CM | POA: Insufficient documentation

## 2016-02-17 DIAGNOSIS — R51 Headache: Secondary | ICD-10-CM | POA: Diagnosis not present

## 2016-02-17 DIAGNOSIS — R531 Weakness: Secondary | ICD-10-CM | POA: Diagnosis not present

## 2016-02-17 DIAGNOSIS — R4781 Slurred speech: Secondary | ICD-10-CM | POA: Diagnosis not present

## 2016-02-17 DIAGNOSIS — I700 Atherosclerosis of aorta: Secondary | ICD-10-CM | POA: Diagnosis not present

## 2016-02-17 HISTORY — DX: Epilepsy, unspecified, not intractable, without status epilepticus: G40.909

## 2016-02-17 HISTORY — DX: Essential tremor: G25.0

## 2016-02-17 LAB — I-STAT CHEM 8, ED
BUN: 15 mg/dL (ref 6–20)
CALCIUM ION: 1.13 mmol/L — AB (ref 1.15–1.40)
Chloride: 101 mmol/L (ref 101–111)
Creatinine, Ser: 0.8 mg/dL (ref 0.44–1.00)
Glucose, Bld: 83 mg/dL (ref 65–99)
HEMATOCRIT: 38 % (ref 36.0–46.0)
Hemoglobin: 12.9 g/dL (ref 12.0–15.0)
Potassium: 3.5 mmol/L (ref 3.5–5.1)
SODIUM: 141 mmol/L (ref 135–145)
TCO2: 30 mmol/L (ref 0–100)

## 2016-02-17 LAB — CBC
HCT: 39.4 % (ref 36.0–46.0)
Hemoglobin: 13.5 g/dL (ref 12.0–15.0)
MCH: 31.9 pg (ref 26.0–34.0)
MCHC: 34.3 g/dL (ref 30.0–36.0)
MCV: 93.1 fL (ref 78.0–100.0)
PLATELETS: 149 10*3/uL — AB (ref 150–400)
RBC: 4.23 MIL/uL (ref 3.87–5.11)
RDW: 12.9 % (ref 11.5–15.5)
WBC: 4.9 10*3/uL (ref 4.0–10.5)

## 2016-02-17 LAB — COMPREHENSIVE METABOLIC PANEL
ALBUMIN: 4.3 g/dL (ref 3.5–5.0)
ALK PHOS: 59 U/L (ref 38–126)
ALT: 14 U/L (ref 14–54)
ANION GAP: 9 (ref 5–15)
AST: 17 U/L (ref 15–41)
BILIRUBIN TOTAL: 0.5 mg/dL (ref 0.3–1.2)
BUN: 13 mg/dL (ref 6–20)
CALCIUM: 9.5 mg/dL (ref 8.9–10.3)
CO2: 26 mmol/L (ref 22–32)
Chloride: 105 mmol/L (ref 101–111)
Creatinine, Ser: 0.78 mg/dL (ref 0.44–1.00)
GFR calc Af Amer: >60 mL/min (ref >60)
GFR calc non Af Amer: >60 mL/min (ref >60)
Glucose, Bld: 84 mg/dL (ref 65–99)
POTASSIUM: 3.5 mmol/L (ref 3.5–5.1)
SODIUM: 140 mmol/L (ref 135–145)
TOTAL PROTEIN: 6.8 g/dL (ref 6.5–8.1)

## 2016-02-17 LAB — I-STAT TROPONIN, ED: Troponin i, poc: 0 ng/mL (ref 0.00–0.08)

## 2016-02-17 LAB — APTT: aPTT: 32 seconds (ref 24–36)

## 2016-02-17 LAB — DIFFERENTIAL
Basophils Absolute: 0 10*3/uL (ref 0.0–0.1)
Basophils Relative: 0 %
EOS PCT: 1 %
Eosinophils Absolute: 0.1 10*3/uL (ref 0.0–0.7)
LYMPHS PCT: 38 %
Lymphs Abs: 1.9 10*3/uL (ref 0.7–4.0)
MONO ABS: 0.3 10*3/uL (ref 0.1–1.0)
Monocytes Relative: 7 %
NEUTROS PCT: 54 %
Neutro Abs: 2.7 10*3/uL (ref 1.7–7.7)

## 2016-02-17 LAB — PROTIME-INR
INR: 1.01
PROTHROMBIN TIME: 13.4 s (ref 11.4–15.2)

## 2016-02-17 MED ORDER — ATORVASTATIN CALCIUM 20 MG PO TABS
20.0000 mg | ORAL_TABLET | Freq: Every evening | ORAL | Status: DC
  Administered 2016-02-17: 20 mg via ORAL
  Filled 2016-02-17: qty 1

## 2016-02-17 MED ORDER — NAPROXEN SODIUM 220 MG PO TABS: 220.0000 mg | ORAL_TABLET | Freq: Two times a day (BID) | ORAL | Status: DC | PRN

## 2016-02-17 MED ORDER — IBUPROFEN 400 MG PO TABS
600.0000 mg | ORAL_TABLET | Freq: Four times a day (QID) | ORAL | Status: DC | PRN
  Filled 2016-02-17: qty 1

## 2016-02-17 MED ORDER — IOPAMIDOL (ISOVUE-370) INJECTION 76%
50.0000 mL | Freq: Once | INTRAVENOUS | Status: AC | PRN
  Administered 2016-02-17: 50 mL via INTRAVENOUS

## 2016-02-17 MED ORDER — ASPIRIN EC 81 MG PO TBEC
81.0000 mg | DELAYED_RELEASE_TABLET | Freq: Every day | ORAL | Status: DC
  Administered 2016-02-17 – 2016-02-18 (×2): 81 mg via ORAL
  Filled 2016-02-17 (×2): qty 1

## 2016-02-17 MED ORDER — ONDANSETRON HCL 4 MG/2ML IJ SOLN
4.0000 mg | Freq: Once | INTRAMUSCULAR | Status: AC
  Administered 2016-02-17: 4 mg via INTRAVENOUS
  Filled 2016-02-17: qty 2

## 2016-02-17 MED ORDER — CLONAZEPAM 0.5 MG PO TABS: 0.5000 mg | ORAL_TABLET | Freq: Every day | ORAL | Status: DC | PRN

## 2016-02-17 MED ORDER — IBUPROFEN 400 MG PO TABS: 400.0000 mg | ORAL_TABLET | Freq: Four times a day (QID) | ORAL | Status: DC | PRN

## 2016-02-17 MED ORDER — LAMOTRIGINE 100 MG PO TABS
100.0000 mg | ORAL_TABLET | Freq: Two times a day (BID) | ORAL | Status: DC
  Administered 2016-02-17 – 2016-02-18 (×2): 100 mg via ORAL
  Filled 2016-02-17 (×2): qty 1

## 2016-02-17 MED ORDER — ZOLPIDEM TARTRATE 5 MG PO TABS
5.0000 mg | ORAL_TABLET | Freq: Every day | ORAL | Status: DC
Start: 2016-02-17 — End: 2016-02-18
  Administered 2016-02-17: 5 mg via ORAL
  Filled 2016-02-17: qty 1

## 2016-02-17 MED ORDER — PRIMIDONE 50 MG PO TABS: 100.0000 mg | ORAL_TABLET | ORAL | Status: DC

## 2016-02-17 MED ORDER — PRIMIDONE 50 MG PO TABS
150.0000 mg | ORAL_TABLET | Freq: Every day | ORAL | Status: DC
  Administered 2016-02-17: 150 mg via ORAL
  Filled 2016-02-17 (×2): qty 3

## 2016-02-17 MED ORDER — LEVETIRACETAM ER 500 MG PO TB24
2500.0000 mg | ORAL_TABLET | Freq: Every day | ORAL | Status: DC
  Administered 2016-02-18: 2500 mg via ORAL
  Filled 2016-02-17: qty 5

## 2016-02-17 MED ORDER — IBUPROFEN 200 MG PO TABS
600.0000 mg | ORAL_TABLET | Freq: Four times a day (QID) | ORAL | Status: DC | PRN
  Administered 2016-02-17 – 2016-02-18 (×2): 600 mg via ORAL
  Filled 2016-02-17 (×2): qty 3

## 2016-02-17 MED ORDER — STROKE: EARLY STAGES OF RECOVERY BOOK
Freq: Once | Status: DC
  Filled 2016-02-17: qty 1

## 2016-02-17 MED ORDER — LEVOTHYROXINE SODIUM 88 MCG PO TABS
88.0000 ug | ORAL_TABLET | Freq: Every day | ORAL | Status: DC
  Administered 2016-02-18: 88 ug via ORAL
  Filled 2016-02-17: qty 1

## 2016-02-17 MED ORDER — PROCHLORPERAZINE EDISYLATE 5 MG/ML IJ SOLN
10.0000 mg | Freq: Once | INTRAMUSCULAR | Status: AC
Start: 2016-02-17 — End: 2016-02-17
  Administered 2016-02-17: 10 mg via INTRAVENOUS
  Filled 2016-02-17: qty 2

## 2016-02-17 MED ORDER — VITAMIN D (ERGOCALCIFEROL) 1.25 MG (50000 UNIT) PO CAPS: 50000.0000 [IU] | ORAL_CAPSULE | ORAL | Status: DC

## 2016-02-17 MED ORDER — SERTRALINE HCL 100 MG PO TABS
100.0000 mg | ORAL_TABLET | Freq: Every day | ORAL | Status: DC
  Administered 2016-02-18: 100 mg via ORAL
  Filled 2016-02-17: qty 1

## 2016-02-17 MED ORDER — IBUPROFEN 400 MG PO TABS: 600.0000 mg | ORAL_TABLET | Freq: Four times a day (QID) | ORAL | Status: DC | PRN

## 2016-02-17 MED ORDER — DICYCLOMINE HCL 10 MG PO CAPS
10.0000 mg | ORAL_CAPSULE | Freq: Every day | ORAL | Status: DC | PRN
  Filled 2016-02-17: qty 1

## 2016-02-17 MED ORDER — POLYVINYL ALCOHOL 1.4 % OP SOLN
1.0000 [drp] | Freq: Three times a day (TID) | OPHTHALMIC | Status: DC | PRN
  Filled 2016-02-17: qty 15

## 2016-02-17 MED ORDER — DIVALPROEX SODIUM ER 500 MG PO TB24
500.0000 mg | ORAL_TABLET | Freq: Every day | ORAL | Status: DC
  Administered 2016-02-17: 500 mg via ORAL
  Filled 2016-02-17 (×2): qty 1

## 2016-02-17 MED ORDER — PRIMIDONE 50 MG PO TABS
100.0000 mg | ORAL_TABLET | Freq: Every morning | ORAL | Status: DC
  Administered 2016-02-18: 100 mg via ORAL
  Filled 2016-02-17: qty 2

## 2016-02-17 MED ORDER — ENOXAPARIN SODIUM 40 MG/0.4ML ~~LOC~~ SOLN
40.0000 mg | SUBCUTANEOUS | Status: DC
  Administered 2016-02-17: 40 mg via SUBCUTANEOUS
  Filled 2016-02-17: qty 0.4

## 2016-02-17 MED ORDER — ACETAMINOPHEN 325 MG PO TABS: 650.0000 mg | ORAL_TABLET | Freq: Four times a day (QID) | ORAL | Status: DC | PRN

## 2016-02-17 NOTE — ED Notes (Signed)
Pt ambulated to restroom, pt had complaints of dizziness.

## 2016-02-17 NOTE — ED Provider Notes (Addendum)
[facility] DEPT Provider Note   CSN: [id number] Arrival date & time: 02/17/16  1611     History   Chief Complaint Chief Complaint  Patient presents with  . Facial Droop  . Aphasia    HPI Alyssa Mccarthy is a 51 y.o. female.  HPI patient presents to the emergency room for evaluation of a headache and abnormal pupil size.  Patient started having a headache on Sunday. Yesterday the headache became more severe. She went to see her neurologist today. The neurologist evaluated her and was concerned about her abnormal pupil size. This was a new finding for her. Patient does not normally have headaches. The patient's neurologist wants her to get a CTA of the head and neck and his CTV. She'll plan is if this was negative the patient would follow-up in the office and arrange for an MRI. Patient was at this facility to get her outpatient head CT. Staff at radiology were concerned about her symptoms and told the patient to go to the emergency room. In the emergency room a code stroke was activated because of potential new concerns of facial drooping and slurred speech. Patient has not noticed any difficulties with her speech. She did not realize that she had an abnormal pupil size. She has had a headache that is new and severe for her. She denies any vomiting. No other numbness weakness or other concerns.  Past Medical History:  Diagnosis Date  . Hyperlipidemia     There are no active problems to display for this patient.   Past Surgical History:  Procedure Laterality Date  . ABDOMINAL HYSTERECTOMY    . APPENDECTOMY    . BRAIN SURGERY    . CARPAL TUNNEL RELEASE    . TOTAL KNEE ARTHROPLASTY      OB History    No data available       Home Medications    Prior to Admission medications   Medication Sig Start Date End Date Taking? Authorizing Provider  atorvastatin (LIPITOR) 20 MG tablet Take 20 mg by mouth every evening.   Yes Historical Provider, MD  clonazePAM (KLONOPIN) 0.5 MG  tablet Take 0.5-1 mg by mouth daily as needed for seizure. 09/24/15  Yes Historical Provider, MD  dicyclomine (BENTYL) 10 MG capsule Take 10 mg by mouth daily as needed for spasms. 01/07/15  Yes Historical Provider, MD  divalproex (DEPAKOTE ER) 500 MG 24 hr tablet Take 500 mg by mouth at bedtime.   Yes Historical Provider, MD  ibuprofen (ADVIL,MOTRIN) 200 MG tablet Take 400 mg by mouth every 6 (six) hours as needed for headache or moderate pain.   Yes Historical Provider, MD  lamoTRIgine (LAMICTAL) 100 MG tablet Take 100 mg by mouth 2 (two) times daily.    Yes Historical Provider, MD  levETIRAcetam (KEPPRA XR) 500 MG 24 hr tablet Take 2,500 mg by mouth daily.   Yes Historical Provider, MD  levothyroxine (SYNTHROID, LEVOTHROID) 88 MCG tablet Take 88 mcg by mouth daily before breakfast.   Yes Historical Provider, MD  naproxen sodium (ANAPROX) 220 MG tablet Take 220 mg by mouth 2 (two) times daily as needed (pain).   Yes Historical Provider, MD  primidone (MYSOLINE) 50 MG tablet Take 100-150 mg by mouth See admin instructions. Takes 2 tabs in am and 3 tabs in pm   Yes Historical Provider, MD  sertraline (ZOLOFT) 50 MG tablet Take 100 mg by mouth daily. 06/15/15  Yes Historical Provider, MD  Soft Lens Products (REWETTING DROPS) SOLN Apply  1 drop to eye 3 (three) times daily as needed (for contacts).   Yes Historical Provider, MD  Vitamin D, Ergocalciferol, (DRISDOL) 50000 units CAPS capsule Take 50,000 Units by mouth every Sunday. 11/18/13  Yes Historical Provider, MD  zolpidem (AMBIEN) 10 MG tablet Take 10 mg by mouth at bedtime.    Yes Historical Provider, MD    Family History No family history on file.  Social History Social History  Substance Use Topics  . Smoking status: Never Smoker  . Smokeless tobacco: Never Used  . Alcohol use No     Allergies   Gluten meal; Other; Plavix [clopidogrel bisulfate]; and Penicillins   Review of Systems Review of Systems  All other systems reviewed and  are negative.    Physical Exam Updated Vital Signs BP 142/84   Pulse 67   Temp 98.2 F (36.8 C) (Oral)   Resp 16   SpO2 98%   Physical Exam  Constitutional: She is oriented to person, place, and time. She appears well-developed and well-nourished. No distress.  HENT:  Head: Normocephalic and atraumatic.  Right Ear: External ear normal.  Left Ear: External ear normal.  Mouth/Throat: Oropharynx is clear and moist.  Eyes: Conjunctivae are normal. Right eye exhibits no discharge. Left eye exhibits no discharge. No scleral icterus. Pupils are unequal.  Right pupil is dilated compared to the left  Neck: Neck supple. No tracheal deviation present.  Cardiovascular: Normal rate, regular rhythm and intact distal pulses.   Pulmonary/Chest: Effort normal and breath sounds normal. No stridor. No respiratory distress. She has no wheezes. She has no rales.  Abdominal: Soft. Bowel sounds are normal. She exhibits no distension. There is no tenderness. There is no rebound and no guarding.  Musculoskeletal: She exhibits no edema or tenderness.  Neurological: She is alert and oriented to person, place, and time. She has normal strength. No cranial nerve deficit (no facial droop, extraocular movements intact, no slurred speech) or sensory deficit. She exhibits normal muscle tone. She displays no seizure activity. Coordination normal.  No pronator drift bilateral upper extrem, able to hold both legs off bed for 5 seconds, sensation intact in all extremities, no visual field cuts, no left or right sided neglect, normal finger-nose exam bilaterally, no nystagmus noted   Skin: Skin is warm and dry. No rash noted.  Psychiatric: She has a normal mood and affect.  Nursing note and vitals reviewed.    ED Treatments / Results  Labs (all labs ordered are listed, but only abnormal results are displayed) Labs Reviewed  CBC - Abnormal; Notable for the following:       Result Value   Platelets 149 (*)    All  other components within normal limits  I-STAT CHEM 8, ED - Abnormal; Notable for the following:    Calcium, Ion 1.13 (*)    All other components within normal limits  PROTIME-INR  APTT  DIFFERENTIAL  COMPREHENSIVE METABOLIC PANEL  HEMOGLOBIN A1C  LIPID PANEL  I-STAT TROPOININ, ED  CBG MONITORING, ED    EKG  EKG Interpretation  Date/Time:  Thursday February 17 2016 17:20:53 EST Ventricular Rate:  65 PR Interval:    QRS Duration: 100 QT Interval:  476 QTC Calculation: 495 R Axis:   -8 Text Interpretation:  Sinus rhythm RSR' in V1 or V2, probably normal variant Nonspecific T abnrm, anterolateral leads Borderline prolonged QT interval Baseline wander in lead(s) V2 V5 No significant change since last tracing Confirmed by Tesslyn Baumert  MD-J, Hailee Hollick [id number]) on  02/17/2016 6:27:18 PM       Radiology Ct Angio Head W Or Wo Contrast  Result Date: 02/17/2016 CLINICAL DATA:  51 y/o  F; right facial droop. EXAM: CT ANGIOGRAPHY HEAD AND NECK TECHNIQUE: Multidetector CT imaging of the head and neck was performed using the standard protocol during bolus administration of intravenous contrast. Multiplanar CT image reconstructions and MIPs were obtained to evaluate the vascular anatomy. Carotid stenosis measurements (when applicable) are obtained utilizing NASCET criteria, using the distal internal carotid diameter as the denominator. CONTRAST:  50 cc Isovue 370 COMPARISON:  02/17/2016 CT of the head. FINDINGS: CTA NECK FINDINGS Aortic arch: Standard branching. Imaged portion shows no evidence of aneurysm or dissection. No significant stenosis of the major arch vessel origins. Mild atherosclerosis with calcification. Right carotid system: No evidence of dissection, stenosis (50% or greater) or occlusion. Small fibrofatty plaque of the carotid bifurcation with a 2 mm posteriorly directed outpouching probably representing a small plaque ulceration (series 404, image 106). Left carotid system: No evidence of  dissection, stenosis (50% or greater) or occlusion. Vertebral arteries: Left dominant. No evidence of dissection, stenosis (50% or greater) or occlusion. Skeleton: Mild cervical spondylosis with discogenic degenerative changes most pronounced at the C5-6 level and mild upper cervical facet arthrosis. No high-grade bony canal stenosis or foraminal narrowing. Other neck: Negative. Upper chest: Negative. Review of the MIP images confirms the above findings CTA HEAD FINDINGS Anterior circulation: No significant stenosis, proximal occlusion, aneurysm, or vascular malformation. Minimal calcific atherosclerosis of cavernous segments of ICA. Posterior circulation: No significant stenosis, proximal occlusion, aneurysm, or vascular malformation. Proximal basilar fenestration. Venous sinuses: As permitted by contrast timing, patent. Anatomic variants: Fetal left posterior cerebral artery. No right posterior communicating artery identified, likely hypoplastic or absent. Large right A1 segment and azygos A2 anterior cerebral artery, normal variant. Review of the MIP images confirms the above findings IMPRESSION: 1. Patent carotid and vertebral arteries of the neck without evidence for dissection, hemodynamically significant stenosis, or occlusion. 2. Patent circle of Willis without evidence for large vessel occlusion, aneurysm, significant stenosis, or vascular malformation. 3. Small fibrofatty plaque of the right carotid bifurcation with a 2 mm posteriorly directed outpouching probably representing a small plaque ulceration. 4. Mild aortic atherosclerosis. 5. Mild cervical spondylosis. These results were called by telephone at the time of interpretation on 02/17/2016 at 5:29 pm to Dr. Melba Coon , who verbally acknowledged these results. Electronically Signed   By: Kristine Garbe M.D.   On: 02/17/2016 17:30   Ct Head Wo Contrast  Result Date: 02/17/2016 CLINICAL DATA:  Slurred speech, history of prior  neurosurgery EXAM: CT HEAD WITHOUT CONTRAST TECHNIQUE: Contiguous axial images were obtained from the base of the skull through the vertex without intravenous contrast. COMPARISON:  CT brain scan of 02/14/2016 FINDINGS: Brain: The ventricular system is unchanged in size and configuration and the septum remains midline in position. Deep brain stimulator electrodes are unchanged in position. The fourth ventricle and basilar cisterns appear normal. No hemorrhage, mass lesion, or acute infarction is seen. Vascular: No vascular abnormality is seen on this unenhanced study. Skull: No calvarial abnormality is noted. Sinuses/Orbits: The paranasal sinuses that are visualized are pneumatized. Other: None. IMPRESSION: 1. Negative unenhanced CT of the brain for acute abnormality. 2. Deep brain stimulator electrodes are unchanged in position. Electronically Signed   By: Ivar Drape M.D.   On: 02/17/2016 16:50   Ct Angio Neck W Or Wo Contrast  Result Date: 02/17/2016 CLINICAL DATA:  51  y/o  F; right facial droop. EXAM: CT ANGIOGRAPHY HEAD AND NECK TECHNIQUE: Multidetector CT imaging of the head and neck was performed using the standard protocol during bolus administration of intravenous contrast. Multiplanar CT image reconstructions and MIPs were obtained to evaluate the vascular anatomy. Carotid stenosis measurements (when applicable) are obtained utilizing NASCET criteria, using the distal internal carotid diameter as the denominator. CONTRAST:  50 cc Isovue 370 COMPARISON:  02/17/2016 CT of the head. FINDINGS: CTA NECK FINDINGS Aortic arch: Standard branching. Imaged portion shows no evidence of aneurysm or dissection. No significant stenosis of the major arch vessel origins. Mild atherosclerosis with calcification. Right carotid system: No evidence of dissection, stenosis (50% or greater) or occlusion. Small fibrofatty plaque of the carotid bifurcation with a 2 mm posteriorly directed outpouching probably representing a  small plaque ulceration (series 404, image 106). Left carotid system: No evidence of dissection, stenosis (50% or greater) or occlusion. Vertebral arteries: Left dominant. No evidence of dissection, stenosis (50% or greater) or occlusion. Skeleton: Mild cervical spondylosis with discogenic degenerative changes most pronounced at the C5-6 level and mild upper cervical facet arthrosis. No high-grade bony canal stenosis or foraminal narrowing. Other neck: Negative. Upper chest: Negative. Review of the MIP images confirms the above findings CTA HEAD FINDINGS Anterior circulation: No significant stenosis, proximal occlusion, aneurysm, or vascular malformation. Minimal calcific atherosclerosis of cavernous segments of ICA. Posterior circulation: No significant stenosis, proximal occlusion, aneurysm, or vascular malformation. Proximal basilar fenestration. Venous sinuses: As permitted by contrast timing, patent. Anatomic variants: Fetal left posterior cerebral artery. No right posterior communicating artery identified, likely hypoplastic or absent. Large right A1 segment and azygos A2 anterior cerebral artery, normal variant. Review of the MIP images confirms the above findings IMPRESSION: 1. Patent carotid and vertebral arteries of the neck without evidence for dissection, hemodynamically significant stenosis, or occlusion. 2. Patent circle of Willis without evidence for large vessel occlusion, aneurysm, significant stenosis, or vascular malformation. 3. Small fibrofatty plaque of the right carotid bifurcation with a 2 mm posteriorly directed outpouching probably representing a small plaque ulceration. 4. Mild aortic atherosclerosis. 5. Mild cervical spondylosis. These results were called by telephone at the time of interpretation on 02/17/2016 at 5:29 pm to Dr. Melba Coon , who verbally acknowledged these results. Electronically Signed   By: Kristine Garbe M.D.   On: 02/17/2016 17:30     Procedures Procedures (including critical care time)  Medications Ordered in ED Medications  ibuprofen (ADVIL,MOTRIN) tablet 600 mg (not administered)  acetaminophen (TYLENOL) tablet 650 mg (not administered)   stroke: mapping our early stages of recovery book (not administered)  aspirin EC tablet 81 mg (not administered)  prochlorperazine (COMPAZINE) injection 10 mg (not administered)  ondansetron (ZOFRAN) injection 4 mg (4 mg Intravenous Given 02/17/16 1700)  iopamidol (ISOVUE-370) 76 % injection 50 mL (50 mLs Intravenous Contrast Given 02/17/16 1656)     Initial Impression / Assessment and Plan / ED Course  I have reviewed the triage vital signs and the nursing notes.  Pertinent labs & imaging results that were available during my care of the patient were reviewed by me and considered in my medical decision making (see chart for details).  Clinical Course as of Feb 16 1906  Thu Feb 17, 2016  1906 Compazine give for headache  [JK]    Clinical Course User Index [JK] Dorie Rank, MD  the patient was evaluated by neurology in the emergency room. No indication for TPA based on the onset of her symptoms  and the symptoms she currently has her period CT scan and CTA did not show any acute abnormalities. Neurology recommends admission for MRI and further evaluation   Final Clinical Impressions(s) / ED Diagnoses   Final diagnoses:  Numbness and tingling of right arm and leg  Nonintractable headache, unspecified chronicity pattern, unspecified headache type  Anisocoria          Dorie Rank, MD 02/17/16 Einar Crow

## 2016-02-17 NOTE — Consult Note (Addendum)
Neurology Consult Note  Reason for Consultation: CODE STROKE  Requesting provider: Tomi Bamberger MD  CC: Headache, R sided numbness, weakness R arm  HPI: This is a 51 year old right-handed woman who presented to the ED from radiology after developing a facial droop and some right-sided weakness. History is obtained from the patient as well as from her friend who is accompanied her to the emergency department.  The patient reports that on 02/13/16, she developed a severe headache. She describes this as a rather abrupt onset headache, initially on the left side but quickly moving over to the right side and settling around the right eye. No other symptoms were reported. Over-the-counter analgesics with improvement in headache. The next day, the headache returned. Again, it started on the left forehead and quickly moved to settle around the right eye. She states that she has had some nausea but no other symptoms with the headache. She did not appreciate any focal neurological deficits.  She saw her neurologist today. When he was examining her, he noted that she had unequal pupils with the right larger than the left. This, in conjunction with her headache, raised concern for possible carotid artery dissection and she was sent for outpatient CT angiogram of the head and neck. While she was in the radiology waiting room, she was noted to have a left facial droop. She was taken to the emergency department for further evaluation. Code stroke was then activated. I met the patient in the CT scanner while she was getting her CT angiogram. She reported that she had some weakness in the left side of her face and in her right arm. These were new symptoms that occurred at about 1515 today. These are accompanied by severe headache, again located around the right eye. She states that she actually did not notice the symptoms in her face and arm until someone pointed them out to her. She denies any similar symptoms in the  past.  Last known well: 1515 NHISS score: 3 mRS score: 0 tPA given?: No, symptoms mild   PMH:  1. Essential tremor  2. Epilepsy, long-standing, last seizure reported about 8 or 9 years ago 3. History of postpartum stroke versus venous sinus thrombosis 17 years ago  PSH:  1. Bilateral deep brain stimulator placement 2015, revision 2017 2. Appendectomy neck sign  3. Abdominal hysterectomy 4. Partial arthroplasty left knee 5. Multiple arthroscopic surgeries left knee 6. Carpal tunnel release   Family history: Unremarkable  Social history:  She is married and lives with her husband. She has no prior history of tobacco, alcohol, or illicit drug use. 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  Current inpatient meds:  Current Facility-Administered Medications  Medication Dose Route Frequency Provider Last Rate Last Dose  .  stroke: mapping our early stages of recovery book   Does not apply Once Darrel Reach, MD      . acetaminophen (TYLENOL) tablet 650 mg  650 mg Oral Q6H PRN Darrel Reach, MD      . aspirin EC tablet 81 mg  81 mg Oral Daily Darrel Reach, MD      . ibuprofen (ADVIL,MOTRIN) tablet 600 mg  600 mg Oral Q6H PRN Darrel Reach, MD       Current Outpatient Prescriptions  Medication Sig Dispense Refill  . divalproex (DEPAKOTE) 500 MG DR tablet Take 500 mg by mouth 3 (three) times daily.    Marland Kitchen ipratropium (ATROVENT) 0.06 % nasal spray Place 2  sprays into both nostrils 4 (four) times daily. (Patient not taking: Reported on 11/29/2015) 15 mL 12  . lamoTRIgine (LAMICTAL) 100 MG tablet Take 100 mg by mouth daily.    Marland Kitchen levETIRAcetam (KEPPRA) 1000 MG tablet Take 2,500 mg by mouth once.    Marland Kitchen levothyroxine (SYNTHROID, LEVOTHROID) 75 MCG tablet Take 75 mcg by mouth daily before breakfast.    . predniSONE (DELTASONE) 20 MG tablet Take 3 tabs po on first day, 2 tabs second day, 2 tabs third day, 1 tab fourth day, 1 tab 5th day. Take with  food. (Patient not taking: Reported on 11/29/2015) 9 tablet 0  . primidone (MYSOLINE) 50 MG tablet Take by mouth 4 (four) times daily.    . Probiotic Product (ALIGN PO) Take by mouth.    . propranolol (INDERAL) 60 MG tablet Take 60 mg by mouth 3 (three) times daily.    . sertraline (ZOLOFT) 100 MG tablet Take 100 mg by mouth daily.    Marland Kitchen zolpidem (AMBIEN) 10 MG tablet Take 10 mg by mouth at bedtime as needed for sleep.      Allergies: Allergies  Allergen Reactions  . Penicillins   . Plavix [Clopidogrel Bisulfate]     ROS: As per HPI. A full 14-point review of systems was performed and is otherwise notable for occasional palpitations, joint pain in both hands, pain in the left knee, and restless legs at night.  PE:  BP 164/88   Pulse 64   Temp 98.2 F (36.8 C) (Oral)   Resp 13   SpO2 99%   General: WDWN, no acute distress. AAO x4. Speech notable for slight intermittent dysarthria. No aphasia. Follows commands briskly. Affect is bright with congruent mood. Comportment is normal.  HEENT: Normocephalic. Neck supple without LAD. MMM, OP clear. Dentition good. Sclerae anicteric. No conjunctival injection.  CV: Regular, no murmur. Carotid pulses full and symmetric, no bruits. Distal pulses 2+ and symmetric.  Lungs: CTAB.  Abdomen: Soft, non-distended. Bowel sounds present x4.  Extremities: No C/C/E. Neuro:  CN: Pupils are unequal with the R 4-->2 and L 3-->2 in the light and the R 6-->4 and the L 4-->3 in the dark. Visual fields are full to confrontation. Possible subtle ptosis on the L. EOMI without nystagmus.She c/o possible diplopia on R lateral gaze, describes a "shadow" of my finger when gazing R. Facial sensation is decreased to light touch and pin on the R. Smile is slightly asymmetric on the L, suggesting a mild L central VII pattern of weakness. Hearing is intact to conversational voice. Palate elevates symmetrically and uvula is midline. Voice is normal in tone, pitch and quality.  Bilateral SCM and trapezii are 5/5. Tongue is midline with normal bulk and mobility.  Motor: Normal bulk, tone, and strength with the exception of 4+/5 R grip and R finger extensors. She has a slight postural tremor of the LUE when held outstretched.No drift.  Sensation: Decreased to light touch and pinprick on the R arm and leg. No extinction.  DTRs: 2+, symmetric. Toes downgoing bilaterally. No pathologic reflexes.  Coordination: Finger-to-nose and heel-to-shin are without dysmetria. Finger taps are normal in amplitude and speed, no decrement.    Labs:  Lab Results  Component Value Date   WBC 4.9 02/17/2016   HGB 12.9 02/17/2016   HCT 38.0 02/17/2016   PLT 149 (L) 02/17/2016   GLUCOSE 83 02/17/2016   ALT 14 02/17/2016   AST 17 02/17/2016   NA 141 02/17/2016   K 3.5 02/17/2016  CL 101 02/17/2016   CREATININE 0.80 02/17/2016   BUN 15 02/17/2016   CO2 26 02/17/2016   INR 1.01 02/17/2016   Troponin 0.00  Imaging:  I have personally and independently reviewed CT scan of the head without contrast from today. This shows bilateral deep brain stimulators with associated artifact. The appeared to be appropriately positioned. Brain is otherwise unremarkable.  I have personally and independently reviewed CT angiogram of the head and neck with and without contrast from today. This is unremarkable. These results were discussed with the interpreting radiologist at the time of this consult.  Assessment and Plan:  1. Possible ischemic stroke: Her presentation is certainly concerning for an acute ischemic event. However, it is difficult to localize all of her symptoms to one place. She has sensory loss over the right face, arm, and leg in conjunction with left facial weakness and a possible left Horner's. Another possible consideration of complicated migraine. Her only known stroke risk factor is a reported history of hyperlipidemia. She does not take any antiplatelet medications at baseline. CT  angiogram did not show any evidence of a cervical arterial dissection that would explain her presentation. She would benefit from MRI scan for further evaluation but her deep stimulators may preclude imaging. I will order echocardiogram, fasting lipids, and hemoglobin A1c to complete her stroke evaluation. Initiate aspirin 81 mg daily. She may benefit from statin but we'll see what her fasting lipid panel is first. Allow permissive hypertension in the acute setting, treating only systolic blood pressure greater than 342 or diastolic greater than 876. Avoid fever and hyperglycemia.  2. Headache: She reports severe onset of headache, generally located around the right eye. She has had some nausea but denies any significant photophobia or phonophobia. She has no prior history of migraine or other significant headaches. There is no evidence of cervical arterial dissection or other vascular injury that might explain her headaches. CT scan of head shows no evidence of any mass lesion or other intracranial pathology that would explain her headache. At this point, headache will be treated symptomatically with ibuprofen, acetaminophen, and antiemetics as needed.  3. Right hand weakness: She has very mild weakness of the right fingers and grip. This is acute, concerning for acute stroke as noted above. OT to evaluate and treat as needed.  4. Right hemisensory loss: This is acute, concerning for acute stroke PT, OT as needed.  5. Anisocoria: Pupils are unequal with the right larger than the left. Her left pupil does not dilate in the dark as well as her right, suggesting that there may be some sympathetic denervation of the left pupil. She may also have some very subtle ptosis on the left but this is difficult to discern. This raises concern for possible left Horner's syndrome. No acute intervention warranted, continue to observe.  6. Epilepsy: She reports a history of generalized tonic-clonic seizures, the last of  which occurred about 8 or 9 years ago. Continue current outpatient regimen. No acute issues.  7. Essential tremor: She is status post placement of bilateral deep brain stimulators with good results. Continue outpatient regimen. No acute issue.  This was discussed with the patient at length. She is in agreement with the plan as stated. She was given opportunity to ask any questions and these were addressed to her satisfaction.  The stroke team will assume care of the patient beginning 02/18/16. Please call with any urgent questions or concerns.

## 2016-02-17 NOTE — H&P (Signed)
History and Physical    Alyssa Mccarthy [id number] DOB: [birth year] DOA: 02/17/2016   PCP: No primary care provider on file. Chief Complaint:  Chief Complaint  Patient presents with  . Facial Droop  . Aphasia    HPI: Alyssa Mccarthy is a 51 y.o. female with medical history significant of seizure disorder, essential tremor.  Patient presents to the ED from radiology after developing facial droop and some R sided facial weakness.  Patient developed severe headache on 1/21, initially on the left side but quickly moving over to the right side and settling around the right eye. No other symptoms were reported. Over-the-counter analgesics with improvement in headache. The next day, the headache returned. Again, it started on the left forehead and quickly moved to settle around the right eye. She states that she has had some nausea but no other symptoms with the headache. She did not appreciate any focal neurological deficits.  Due to persistent headache patient saw her Neurologist, Dr. [name], who evaluated her and became concerned due to finding of Ansiocoria on exam.  Due to concern for possible carotid artery dissection vs PCOM aneurysm vs venous thrombosis, patient was sent for CTA head and neck.  While in the radiology waiting room, she was noted to have L facial droop and was taken to the ED as a code stroke.  She reports that she has some weakness in L face and R arm.  New symptoms onset around 1515 today.  Symptoms accompanied by severe headache again located about the R eye.  ED Course: CT head and CTA head and neck are unremarkable for acute findings other than a small ulceration of plaque at R carotid bifurcation.  Review of Systems: As per HPI otherwise 10 point review of systems negative.    Past Medical History:  Diagnosis Date  . Essential tremor   . Hyperlipidemia   . Seizure disorder [facility])     Past Surgical History:  Procedure Laterality Date  . ABDOMINAL HYSTERECTOMY    .  APPENDECTOMY    . BRAIN SURGERY    . CARPAL TUNNEL RELEASE    . TOTAL KNEE ARTHROPLASTY       reports that she has never smoked. She has never used smokeless tobacco. She reports that she does not drink alcohol or use drugs.  Allergies  Allergen Reactions  . Gluten Meal Diarrhea    Severe diarrhea  . Other Other (See Comments)    Bleach-respiratory distress  . Plavix [Clopidogrel Bisulfate] Itching and Rash    Hands and feet   . Penicillins Itching, Rash and Other (See Comments)    Childhood allergy- specifics unknown; tolerated amoxicillin and ancef since then     Family History  Problem Relation Age of Onset  . Heart attack Father 36  . Stroke Neg Hx      Prior to Admission medications   Medication Sig Start Date End Date Taking? Authorizing Provider  atorvastatin (LIPITOR) 20 MG tablet Take 20 mg by mouth every evening.   Yes Historical Provider, MD  clonazePAM (KLONOPIN) 0.5 MG tablet Take 0.5-1 mg by mouth daily as needed for seizure. 09/24/15  Yes Historical Provider, MD  dicyclomine (BENTYL) 10 MG capsule Take 10 mg by mouth daily as needed for spasms. 01/07/15  Yes Historical Provider, MD  divalproex (DEPAKOTE ER) 500 MG 24 hr tablet Take 500 mg by mouth at bedtime.   Yes Historical Provider, MD  ibuprofen (ADVIL,MOTRIN) 200 MG tablet Take 400 mg by mouth  every 6 (six) hours as needed for headache or moderate pain.   Yes Historical Provider, MD  lamoTRIgine (LAMICTAL) 100 MG tablet Take 100 mg by mouth 2 (two) times daily.    Yes Historical Provider, MD  levETIRAcetam (KEPPRA XR) 500 MG 24 hr tablet Take 2,500 mg by mouth daily.   Yes Historical Provider, MD  levothyroxine (SYNTHROID, LEVOTHROID) 88 MCG tablet Take 88 mcg by mouth daily before breakfast.   Yes Historical Provider, MD  naproxen sodium (ANAPROX) 220 MG tablet Take 220 mg by mouth 2 (two) times daily as needed (pain).   Yes Historical Provider, MD  primidone (MYSOLINE) 50 MG tablet Take 100-150 mg by mouth  See admin instructions. Takes 2 tabs in am and 3 tabs in pm   Yes Historical Provider, MD  sertraline (ZOLOFT) 50 MG tablet Take 100 mg by mouth daily. 06/15/15  Yes Historical Provider, MD  Soft Lens Products (REWETTING DROPS) SOLN Apply 1 drop to eye 3 (three) times daily as needed (for contacts).   Yes Historical Provider, MD  Vitamin D, Ergocalciferol, (DRISDOL) 50000 units CAPS capsule Take 50,000 Units by mouth every Sunday. 11/18/13  Yes Historical Provider, MD  zolpidem (AMBIEN) 10 MG tablet Take 10 mg by mouth at bedtime.    Yes Historical Provider, MD    Physical Exam: Vitals:   02/17/16 1745 02/17/16 1815 02/17/16 1830 02/17/16 1921  BP: 164/88 148/77 142/84 (!) 141/128  Pulse: 64 (!) 58 67 60  Resp: 13 13 16 17   Temp:      TempSrc:      SpO2: 99% 99% 98% 98%      Constitutional: NAD, calm, comfortable Eyes: Pupils unequal R>L, both reactive lids and conjunctivae normal ENMT: Mucous membranes are moist. Posterior pharynx clear of any exudate or lesions.Normal dentition.  Neck: normal, supple, no masses, no thyromegaly Respiratory: clear to auscultation bilaterally, no wheezing, no crackles. Normal respiratory effort. No accessory muscle use.  Cardiovascular: Regular rate and rhythm, no murmurs / rubs / gallops. No extremity edema. 2+ pedal pulses. No carotid bruits.  Abdomen: no tenderness, no masses palpated. No hepatosplenomegaly. Bowel sounds positive.  Musculoskeletal: no clubbing / cyanosis. No joint deformity upper and lower extremities. Good ROM, no contractures. Normal muscle tone.  Skin: no rashes, lesions, ulcers. No induration Neurologic: Mild L facial droop. Psychiatric: Normal judgment and insight. Alert and oriented x 3. Normal mood.    Labs on Admission: I have personally reviewed following labs and imaging studies  CBC:  Recent Labs Lab 02/14/16 1852 02/17/16 1651 02/17/16 1705  WBC  --  4.9  --   NEUTROABS  --  2.7  --   HGB 12.9 13.5 12.9  HCT  38.0 39.4 38.0  MCV  --  93.1  --   PLT  --  149*  --    Basic Metabolic Panel:  Recent Labs Lab 02/14/16 1852 02/17/16 1651 02/17/16 1705  NA 142 140 141  K 3.8 3.5 3.5  CL 102 105 101  CO2  --  26  --   GLUCOSE 105* 84 83  BUN 9 13 15   CREATININE 0.70 0.78 0.80  CALCIUM  --  9.5  --    GFR: Estimated Creatinine Clearance: 74.8 mL/min (by C-G formula based on SCr of 0.8 mg/dL). Liver Function Tests:  Recent Labs Lab 02/17/16 1651  AST 17  ALT 14  ALKPHOS 59  BILITOT 0.5  PROT 6.8  ALBUMIN 4.3   No results for input(s): LIPASE,  AMYLASE in the last 168 hours. No results for input(s): AMMONIA in the last 168 hours. Coagulation Profile:  Recent Labs Lab 02/17/16 1651  INR 1.01   Cardiac Enzymes: No results for input(s): CKTOTAL, CKMB, CKMBINDEX, TROPONINI in the last 168 hours. BNP (last 3 results) No results for input(s): PROBNP in the last 8760 hours. HbA1C: No results for input(s): HGBA1C in the last 72 hours. CBG: No results for input(s): GLUCAP in the last 168 hours. Lipid Profile: No results for input(s): CHOL, HDL, LDLCALC, TRIG, CHOLHDL, LDLDIRECT in the last 72 hours. Thyroid Function Tests: No results for input(s): TSH, T4TOTAL, FREET4, T3FREE, THYROIDAB in the last 72 hours. Anemia Panel: No results for input(s): VITAMINB12, FOLATE, FERRITIN, TIBC, IRON, RETICCTPCT in the last 72 hours. Urine analysis:    Component Value Date/Time   COLORURINE YELLOW 05/11/2010 0127   APPEARANCEUR CLEAR 05/11/2010 0127   LABSPEC 1.013 05/11/2010 0127   PHURINE 6.5 05/11/2010 0127   GLUCOSEU NEGATIVE 05/11/2010 0127   HGBUR NEGATIVE 05/11/2010 0127   BILIRUBINUR NEGATIVE 05/11/2010 0127   KETONESUR TRACE (A) 05/11/2010 0127   PROTEINUR NEGATIVE 05/11/2010 0127   UROBILINOGEN 1.0 05/11/2010 0127   NITRITE NEGATIVE 05/11/2010 0127   LEUKOCYTESUR  05/11/2010 0127    NEGATIVE MICROSCOPIC NOT DONE ON URINES WITH NEGATIVE PROTEIN, BLOOD, LEUKOCYTES, NITRITE, OR  GLUCOSE <1000 mg/dL.   Sepsis Labs: @LABRCNTIP (procalcitonin:4,lacticidven:4) )No results found for this or any previous visit (from the past 240 hour(s)).   Radiological Exams on Admission: Ct Angio Head W Or Wo Contrast  Result Date: 02/17/2016 CLINICAL DATA:  51 y/o  F; right facial droop. EXAM: CT ANGIOGRAPHY HEAD AND NECK TECHNIQUE: Multidetector CT imaging of the head and neck was performed using the standard protocol during bolus administration of intravenous contrast. Multiplanar CT image reconstructions and MIPs were obtained to evaluate the vascular anatomy. Carotid stenosis measurements (when applicable) are obtained utilizing NASCET criteria, using the distal internal carotid diameter as the denominator. CONTRAST:  50 cc Isovue 370 COMPARISON:  02/17/2016 CT of the head. FINDINGS: CTA NECK FINDINGS Aortic arch: Standard branching. Imaged portion shows no evidence of aneurysm or dissection. No significant stenosis of the major arch vessel origins. Mild atherosclerosis with calcification. Right carotid system: No evidence of dissection, stenosis (50% or greater) or occlusion. Small fibrofatty plaque of the carotid bifurcation with a 2 mm posteriorly directed outpouching probably representing a small plaque ulceration (series 404, image 106). Left carotid system: No evidence of dissection, stenosis (50% or greater) or occlusion. Vertebral arteries: Left dominant. No evidence of dissection, stenosis (50% or greater) or occlusion. Skeleton: Mild cervical spondylosis with discogenic degenerative changes most pronounced at the C5-6 level and mild upper cervical facet arthrosis. No high-grade bony canal stenosis or foraminal narrowing. Other neck: Negative. Upper chest: Negative. Review of the MIP images confirms the above findings CTA HEAD FINDINGS Anterior circulation: No significant stenosis, proximal occlusion, aneurysm, or vascular malformation. Minimal calcific atherosclerosis of cavernous  segments of ICA. Posterior circulation: No significant stenosis, proximal occlusion, aneurysm, or vascular malformation. Proximal basilar fenestration. Venous sinuses: As permitted by contrast timing, patent. Anatomic variants: Fetal left posterior cerebral artery. No right posterior communicating artery identified, likely hypoplastic or absent. Large right A1 segment and azygos A2 anterior cerebral artery, normal variant. Review of the MIP images confirms the above findings IMPRESSION: 1. Patent carotid and vertebral arteries of the neck without evidence for dissection, hemodynamically significant stenosis, or occlusion. 2. Patent circle of Willis without evidence for large vessel occlusion,  aneurysm, significant stenosis, or vascular malformation. 3. Small fibrofatty plaque of the right carotid bifurcation with a 2 mm posteriorly directed outpouching probably representing a small plaque ulceration. 4. Mild aortic atherosclerosis. 5. Mild cervical spondylosis. These results were called by telephone at the time of interpretation on 02/17/2016 at 5:29 pm to Dr. Melba Coon , who verbally acknowledged these results. Electronically Signed   By: Kristine Garbe M.D.   On: 02/17/2016 17:30   Ct Head Wo Contrast  Result Date: 02/17/2016 CLINICAL DATA:  Slurred speech, history of prior neurosurgery EXAM: CT HEAD WITHOUT CONTRAST TECHNIQUE: Contiguous axial images were obtained from the base of the skull through the vertex without intravenous contrast. COMPARISON:  CT brain scan of 02/14/2016 FINDINGS: Brain: The ventricular system is unchanged in size and configuration and the septum remains midline in position. Deep brain stimulator electrodes are unchanged in position. The fourth ventricle and basilar cisterns appear normal. No hemorrhage, mass lesion, or acute infarction is seen. Vascular: No vascular abnormality is seen on this unenhanced study. Skull: No calvarial abnormality is noted. Sinuses/Orbits:  The paranasal sinuses that are visualized are pneumatized. Other: None. IMPRESSION: 1. Negative unenhanced CT of the brain for acute abnormality. 2. Deep brain stimulator electrodes are unchanged in position. Electronically Signed   By: Ivar Drape M.D.   On: 02/17/2016 16:50   Ct Angio Neck W Or Wo Contrast  Result Date: 02/17/2016 CLINICAL DATA:  51 y/o  F; right facial droop. EXAM: CT ANGIOGRAPHY HEAD AND NECK TECHNIQUE: Multidetector CT imaging of the head and neck was performed using the standard protocol during bolus administration of intravenous contrast. Multiplanar CT image reconstructions and MIPs were obtained to evaluate the vascular anatomy. Carotid stenosis measurements (when applicable) are obtained utilizing NASCET criteria, using the distal internal carotid diameter as the denominator. CONTRAST:  50 cc Isovue 370 COMPARISON:  02/17/2016 CT of the head. FINDINGS: CTA NECK FINDINGS Aortic arch: Standard branching. Imaged portion shows no evidence of aneurysm or dissection. No significant stenosis of the major arch vessel origins. Mild atherosclerosis with calcification. Right carotid system: No evidence of dissection, stenosis (50% or greater) or occlusion. Small fibrofatty plaque of the carotid bifurcation with a 2 mm posteriorly directed outpouching probably representing a small plaque ulceration (series 404, image 106). Left carotid system: No evidence of dissection, stenosis (50% or greater) or occlusion. Vertebral arteries: Left dominant. No evidence of dissection, stenosis (50% or greater) or occlusion. Skeleton: Mild cervical spondylosis with discogenic degenerative changes most pronounced at the C5-6 level and mild upper cervical facet arthrosis. No high-grade bony canal stenosis or foraminal narrowing. Other neck: Negative. Upper chest: Negative. Review of the MIP images confirms the above findings CTA HEAD FINDINGS Anterior circulation: No significant stenosis, proximal occlusion,  aneurysm, or vascular malformation. Minimal calcific atherosclerosis of cavernous segments of ICA. Posterior circulation: No significant stenosis, proximal occlusion, aneurysm, or vascular malformation. Proximal basilar fenestration. Venous sinuses: As permitted by contrast timing, patent. Anatomic variants: Fetal left posterior cerebral artery. No right posterior communicating artery identified, likely hypoplastic or absent. Large right A1 segment and azygos A2 anterior cerebral artery, normal variant. Review of the MIP images confirms the above findings IMPRESSION: 1. Patent carotid and vertebral arteries of the neck without evidence for dissection, hemodynamically significant stenosis, or occlusion. 2. Patent circle of Willis without evidence for large vessel occlusion, aneurysm, significant stenosis, or vascular malformation. 3. Small fibrofatty plaque of the right carotid bifurcation with a 2 mm posteriorly directed outpouching probably representing a  small plaque ulceration. 4. Mild aortic atherosclerosis. 5. Mild cervical spondylosis. These results were called by telephone at the time of interpretation on 02/17/2016 at 5:29 pm to Dr. Melba Coon , who verbally acknowledged these results. Electronically Signed   By: Kristine Garbe M.D.   On: 02/17/2016 17:30    EKG: Independently reviewed.  Assessment/Plan Principal Problem:   Stroke-like symptoms Active Problems:   Anisocoria   Epilepsy (HCC)   Essential tremor   HLD (hyperlipidemia)   Headache    1. Stroke like symptoms - 1. Neurology has evaluated, please see their note 2. Stroke pathway 3. MRI order put in by Dr. Shon Hale, but unsure that we will be able to get MRI here at [facility] on patient due to CNS stimulator in place. 4. A1C, lipid pnl 5. PT/OT 6. Permissive HTN 7. ASA 81 daily 2. HLD - continue statin, lipid pnl in AM 3. Headache - 1. Symptomatic treatment with ibuprofen, acetaminophen, and antiemetics 4. Anisocoria -  ? Left Horner's syndrome, but certianly doesn't have a PCOM aneurysm on the CTA read 5. Epilepsy - continue seizure meds, last seizure was 8 or 9 years ago 6. Essential tremor - s/p placement of bilateral deep brain stimulators   DVT prophylaxis: Lovenox Code Status: Full Family Communication: Family at bedside Consults called: Neuro has seen patient in ED Admission status: Place in [place], [place] Hospitalists Pager [phone] from 7PM-7AM  If 7AM-7PM, please contact the day physician for the patient [email or web address] Password [facility]  02/17/2016, 7:52 PM

## 2016-02-17 NOTE — ED Triage Notes (Signed)
Per Pt, Pt is coming from CT after she had been sent over from PCP to r/o hemorrhage. Pt reports sever HA on Monday where she saw her neurologist. Pt reports today, she had a follow-up and was sent to get a CT when Neurologist noted right eye pupil was larger than the left. While in CT, pt was noted to have right sided facial droop, slurred speech, and defined pupil difference. Pt reports right anterior sever headache that started in the last ten minutes.

## 2016-02-18 ENCOUNTER — Observation Stay (HOSPITAL_BASED_OUTPATIENT_CLINIC_OR_DEPARTMENT_OTHER): Payer: 59

## 2016-02-18 DIAGNOSIS — I6330 Cerebral infarction due to thrombosis of unspecified cerebral artery: Secondary | ICD-10-CM | POA: Insufficient documentation

## 2016-02-18 DIAGNOSIS — R2990 Unspecified symptoms and signs involving the nervous system: Secondary | ICD-10-CM | POA: Diagnosis not present

## 2016-02-18 DIAGNOSIS — G40409 Other generalized epilepsy and epileptic syndromes, not intractable, without status epilepticus: Secondary | ICD-10-CM | POA: Diagnosis not present

## 2016-02-18 DIAGNOSIS — H5702 Anisocoria: Secondary | ICD-10-CM | POA: Diagnosis not present

## 2016-02-18 DIAGNOSIS — E785 Hyperlipidemia, unspecified: Secondary | ICD-10-CM | POA: Diagnosis not present

## 2016-02-18 DIAGNOSIS — R51 Headache: Secondary | ICD-10-CM | POA: Diagnosis not present

## 2016-02-18 DIAGNOSIS — I6789 Other cerebrovascular disease: Secondary | ICD-10-CM

## 2016-02-18 DIAGNOSIS — G40909 Epilepsy, unspecified, not intractable, without status epilepticus: Secondary | ICD-10-CM | POA: Diagnosis not present

## 2016-02-18 DIAGNOSIS — Z96659 Presence of unspecified artificial knee joint: Secondary | ICD-10-CM | POA: Diagnosis not present

## 2016-02-18 DIAGNOSIS — R29810 Facial weakness: Secondary | ICD-10-CM | POA: Diagnosis not present

## 2016-02-18 DIAGNOSIS — G250 Essential tremor: Secondary | ICD-10-CM | POA: Diagnosis not present

## 2016-02-18 DIAGNOSIS — R4781 Slurred speech: Secondary | ICD-10-CM | POA: Diagnosis not present

## 2016-02-18 DIAGNOSIS — R531 Weakness: Secondary | ICD-10-CM | POA: Diagnosis not present

## 2016-02-18 LAB — LIPID PANEL
CHOL/HDL RATIO: 3.9 ratio
CHOLESTEROL: 189 mg/dL (ref 0–200)
HDL: 49 mg/dL (ref >40)
LDL Cholesterol: 112 mg/dL — ABNORMAL HIGH (ref 0–99)
TRIGLYCERIDES: 138 mg/dL (ref <150)
VLDL: 28 mg/dL (ref 0–40)

## 2016-02-18 LAB — ECHOCARDIOGRAM COMPLETE
Height: 62 in
Weight: 2432 oz

## 2016-02-18 MED ORDER — ATORVASTATIN CALCIUM 40 MG PO TABS: 40.0000 mg | ORAL_TABLET | Freq: Every evening | ORAL | 1 refills | Status: DC

## 2016-02-18 MED ORDER — ATORVASTATIN CALCIUM 40 MG PO TABS
40.0000 mg | ORAL_TABLET | Freq: Every evening | ORAL | Status: DC
  Administered 2016-02-18: 40 mg via ORAL
  Filled 2016-02-18: qty 1

## 2016-02-18 MED ORDER — ASPIRIN 325 MG PO TBEC: 325.0000 mg | DELAYED_RELEASE_TABLET | Freq: Every day | ORAL | 1 refills | Status: AC

## 2016-02-18 MED ORDER — PREMIER PROTEIN SHAKE
11.0000 [oz_av] | ORAL | Status: DC
  Filled 2016-02-18: qty 325.31

## 2016-02-18 MED ORDER — HYDROCODONE-ACETAMINOPHEN 5-325 MG PO TABS
1.0000 | ORAL_TABLET | Freq: Four times a day (QID) | ORAL | Status: DC | PRN
  Administered 2016-02-18 (×2): 1 via ORAL
  Filled 2016-02-18 (×2): qty 1

## 2016-02-18 MED ORDER — ASPIRIN EC 325 MG PO TBEC: 325.0000 mg | DELAYED_RELEASE_TABLET | Freq: Every day | ORAL | Status: DC

## 2016-02-18 MED ORDER — ADULT MULTIVITAMIN W/MINERALS CH
1.0000 | ORAL_TABLET | Freq: Every day | ORAL | Status: DC
  Administered 2016-02-18: 1 via ORAL
  Filled 2016-02-18: qty 1

## 2016-02-18 MED ORDER — HYDROCODONE-ACETAMINOPHEN 5-325 MG PO TABS: 1.0000 | ORAL_TABLET | Freq: Four times a day (QID) | ORAL | 0 refills | Status: DC | PRN

## 2016-02-18 MED ORDER — UNABLE TO FIND: 0 refills | Status: DC

## 2016-02-18 NOTE — Discharge Instructions (Addendum)
Follow with Dr. [name] in 3-4 weeks  Please get a complete blood count and chemistry panel checked by your Primary MD at your next visit, and again as instructed by your Primary MD. Please get your medications reviewed and adjusted by your Primary MD.  Please request your Primary MD to go over all Hospital Tests and Procedure/Radiological results at the follow up, please get all Hospital records sent to your Prim MD by signing hospital release before you go home.  If you had Pneumonia of Lung problems at the Hospital: Please get a 2 view Chest X ray done in 6-8 weeks after hospital discharge or sooner if instructed by your Primary MD.  If you have Congestive Heart Failure: Please call your Cardiologist or Primary MD anytime you have any of the following symptoms:  1) 3 pound weight gain in 24 hours or 5 pounds in 1 week  2) shortness of breath, with or without a dry hacking cough  3) swelling in the hands, feet or stomach  4) if you have to sleep on extra pillows at night in order to breathe  Follow cardiac low salt diet and 1.5 lit/day fluid restriction.  If you have diabetes Accuchecks 4 times/day, Once in AM empty stomach and then before each meal. Log in all results and show them to your primary doctor at your next visit. If any glucose reading is under 80 or above 300 call your primary MD immediately.  If you have Seizure/Convulsions/Epilepsy: Please do not drive, operate heavy machinery, participate in activities at heights or participate in high speed sports until you have seen by Primary MD or a Neurologist and advised to do so again.  If you had Gastrointestinal Bleeding: Please ask your Primary MD to check a complete blood count within one week of discharge or at your next visit. Your endoscopic/colonoscopic biopsies that are pending at the time of discharge, will also need to followed by your Primary MD.  Get Medicines reviewed and adjusted. Please take all your medications  with you for your next visit with your Primary MD  Please request your Primary MD to go over all hospital tests and procedure/radiological results at the follow up, please ask your Primary MD to get all Hospital records sent to his/her office.  If you experience worsening of your admission symptoms, develop shortness of breath, life threatening emergency, suicidal or homicidal thoughts you must seek medical attention immediately by calling 911 or calling your MD immediately  if symptoms less severe.  You must read complete instructions/literature along with all the possible adverse reactions/side effects for all the Medicines you take and that have been prescribed to you. Take any new Medicines after you have completely understood and accpet all the possible adverse reactions/side effects.   Do not drive or operate heavy machinery when taking Pain medications.   Do not take more than prescribed Pain, Sleep and Anxiety Medications  Special Instructions: If you have smoked or chewed Tobacco  in the last 2 yrs please stop smoking, stop any regular Alcohol  and or any Recreational drug use.  Wear Seat belts while driving.  Please note You were cared for by a hospitalist during your hospital stay. If you have any questions about your discharge medications or the care you received while you were in the hospital after you are discharged, you can call the unit and asked to speak with the hospitalist on call if the hospitalist that took care of you is not available. Once you  are discharged, your primary care physician will handle any further medical issues. Please note that NO REFILLS for any discharge medications will be authorized once you are discharged, as it is imperative that you return to your primary care physician (or establish a relationship with a primary care physician if you do not have one) for your aftercare needs so that they can reassess your need for medications and monitor your lab  values.  You can reach the hospitalist office at phone [phone] or fax [phone]   If you do not have a primary care physician, you can call [phone] for a physician referral.  Activity: As tolerated with Full fall precautions use walker/cane & assistance as needed  Diet: heart healthy  Disposition Home

## 2016-02-18 NOTE — Progress Notes (Signed)
  Echocardiogram 2D Echocardiogram has been performed.  Jennette Dubin 02/18/2016, 10:09 AM

## 2016-02-18 NOTE — Progress Notes (Signed)
Pt off the floor for 1000 vital and neuro exam.  Will continue to monitor.  Cori Razor, RN

## 2016-02-18 NOTE — Care Management Note (Addendum)
Case Management Note  Patient Details  Name: Alyssa Mccarthy MRN: [id number] Date of Birth: 10-04-1965  Subjective/Objective:                    Action/Plan: Pt discharging home with self care. CM consulted for outpatient therapy. CM met with the patient and she was interested in [facility] on [address]. Orders placed in EPIC and information on the AVS.   Expected Discharge Date:  02/18/16               Expected Discharge Plan:  Home/Self Care  In-House Referral:     Discharge planning Services  CM Consult  Post Acute Care Choice:    Choice offered to:     DME Arranged:    DME Agency:     HH Arranged:    HH Agency:     Status of Service:  Completed, signed off  If discussed at [employer] of Stay Meetings, dates discussed:    Additional Comments:  Pollie Friar, RN 02/18/2016, 5:27 PM

## 2016-02-18 NOTE — Progress Notes (Signed)
STROKE TEAM PROGRESS NOTE   HISTORY OF PRESENT ILLNESS (per record) This is a 51 year old right-handed woman who presented to the ED from radiology after developing a facial droop and some right-sided weakness. History is obtained from the patient as well as from her friend who is accompanied her to the emergency department.  The patient reports that on 02/13/16, she developed a severe headache. She describes this as a rather abrupt onset headache, initially on the left side but quickly moving over to the right side and settling around the right eye. No other symptoms were reported. Over-the-counter analgesics with improvement in headache. The next day, the headache returned. Again, it started on the left forehead and quickly moved to settle around the right eye. She states that she has had some nausea but no other symptoms with the headache. She did not appreciate any focal neurological deficits.  She saw her neurologist today. When he was examining her, he noted that she had unequal pupils with the right larger than the left. This, in conjunction with her headache, raised concern for possible carotid artery dissection and she was sent for outpatient CT angiogram of the head and neck. While she was in the radiology waiting room, she was noted to have a left facial droop. She was taken to the emergency department for further evaluation. Code stroke was then activated. I met the patient in the CT scanner while she was getting her CT angiogram. She reported that she had some weakness in the left side of her face and in her right arm. These were new symptoms that occurred at about 1515 today. These are accompanied by severe headache, again located around the right eye. She states that she actually did not notice the symptoms in her face and arm until someone pointed them out to her. She denies any similar symptoms in the past.  Last known well: 1515 NHISS score: 3 mRS score: 0 tPA given?: No, symptoms  mild   SUBJECTIVE (INTERVAL HISTORY) Husband and son at bedside. The patient still has a mild headache but denies any history of migraine headaches.    OBJECTIVE Temp:  [97.8 F (36.6 C)-98.2 F (36.8 C)] 97.8 F (36.6 C) (01/25 2300) Pulse Rate:  [50-72] 57 (01/26 0400) Cardiac Rhythm: Sinus bradycardia (01/25 2106) Resp:  [12-18] 18 (01/26 0400) BP: (101-174)/(60-128) 101/60 (01/26 0400) SpO2:  [92 %-100 %] 97 % (01/26 0400) Weight:  [68.9 kg (152 lb)] 68.9 kg (152 lb) (01/25 2106)  CBC:  Recent Labs Lab 02/17/16 1651 02/17/16 1705  WBC 4.9  --   NEUTROABS 2.7  --   HGB 13.5 12.9  HCT 39.4 38.0  MCV 93.1  --   PLT 149*  --     Basic Metabolic Panel:  Recent Labs Lab 02/17/16 1651 02/17/16 1705  NA 140 141  K 3.5 3.5  CL 105 101  CO2 26  --   GLUCOSE 84 83  BUN 13 15  CREATININE 0.78 0.80  CALCIUM 9.5  --     Lipid Panel:    Component Value Date/Time   CHOL 189 02/18/2016 0318   TRIG 138 02/18/2016 0318   HDL 49 02/18/2016 0318   CHOLHDL 3.9 02/18/2016 0318   VLDL 28 02/18/2016 0318   LDLCALC 112 (H) 02/18/2016 0318   HgbA1c: No results found for: HGBA1C Urine Drug Screen: No results found for: LABOPIA, COCAINSCRNUR, LABBENZ, AMPHETMU, THCU, LABBARB    IMAGING  Ct Angio Head and Neck W Or Wo Contrast 02/17/2016  1. Patent carotid and vertebral arteries of the neck without evidence for dissection, hemodynamically significant stenosis, or occlusion.  2. Patent circle of Willis without evidence for large vessel occlusion, aneurysm, significant stenosis, or vascular malformation. 3. Small fibrofatty plaque of the right carotid bifurcation with a 2 mm posteriorly directed outpouching probably representing a small plaque ulceration.  4. Mild aortic atherosclerosis.  5. Mild cervical spondylosis.     Ct Head Wo Contrast 02/17/2016 1. Negative unenhanced CT of the brain for acute abnormality.  2. Deep brain stimulator electrodes are unchanged in  position.      MR Brain Wo Contrast - ordered but cannot do secondary to deep brain stimulator.   Transesophageal Echocardiogram 02/18/2016 Study Conclusions - Left ventricle: The cavity size was normal. Wall thickness was   normal. Systolic function was normal. The estimated ejection   fraction was in the range of 55% to 60%. Wall motion was normal;   there were no regional wall motion abnormalities. Doppler   parameters are consistent with abnormal left ventricular   relaxation (grade 1 diastolic dysfunction).    PHYSICAL EXAM Pleasant middle aged 69 lady not in distress. . Afebrile. Head is nontraumatic. Neck is supple without bruit.    Cardiac exam no murmur or gallop. Lungs are clear to auscultation. Distal pulses are well felt.  Neurological Exam ;  Awake  Alert oriented x 3. Normal speech and language.eye movements full without nystagmus.fundi were not visualized. Vision acuity and fields appear normal.Rt pupil 3 mm reactive and left 2 mm reactive with mild Left Ptosis. Hearing is normal. Palatal movements are normal. Face symmetric. Tongue midline. Normal strength, tone, reflexes and coordination. Normal sensation. But mild subjective paresthsias in RUE. Gait deferred.      ASSESSMENT/PLAN Ms. ZAREYA TUCKETT is a 51 y.o. female with history of essential tremor treated with placement of a deep brain stimulator, hyperlipidemia, and seizure disorder presenting with severe headache, unequal pupils, left facial droop, and right arm weakness. She did not receive IV t-PA due to mild deficits.  Small  Brainstem stroke vs atypical migraine :   Likely secondary to small vessel disease.  Resultant - residual headache with  Mild left Horner's syndrome.  MRI - cannot perform secondary to deep brain stimulator.  MRA - cannot perform secondary to deep brain stimulator.  CTA head and neck - unremarkable except for a possible small fibrofatty plaque in the right carotid  artery.  Carotid Doppler - CTA neck  2D Echo - EF 55-60%. No cardiac source of emboli identified.  LDL - 112  HgbA1c pending  VTE prophylaxis - Lovenox  Diet gluten free Room service appropriate? Yes; Fluid consistency: Thin  No antithrombotic prior to admission, now on aspirin 81 mg daily -> increase to ASA 325 mg daily.  Patient counseled to be compliant with her antithrombotic medications  Ongoing aggressive stroke risk factor management  Therapy recommendations: No follow-up OT recommended. PT evaluation pending.  Disposition: Pending  Hypertension  Stable  Permissive hypertension (OK if < 220/120) but gradually normalize in 5-7 days  Long-term BP goal normotensive  Hyperlipidemia  Home meds:  Lipitor 20 mg daily resumed in hospital  LDL 112, goal < 70  Increase Lipitor to 40 mg daily.  Continue statin at discharge    Other Stroke Risk Factors  Overweight, Body mass index is 27.8 kg/m., recommend weight loss, diet and exercise as appropriate    Other Active Problems  Plavix allergy.  Seizure history - Depakote, Keppra,  Lamictal (sees a neurolgist  Dr [name] at [facility] day # 0 I have personally examined this patient, reviewed notes, independently viewed imaging studies, participated in medical decision making and plan of care.ROS completed by me personally and pertinent positives fully documented  I have made any additions or clarifications directly to the above note. Agree with note above. She presented with to 3 day history of headaches with transient slurred speech, facial droop and new Horner`s likely from small brainstem infarct from small vessel disease not visualized on CT scan.. Complicated migraine possible though less likely .recommend aspirin and continue ongoing stroke workup. Increase Lipitor dose to 40 mg . Long discussion at bedside with patient, husband and son and consult questions. Followup as an outpatient with her  primary neurologist at [place] than 50% time during this 35 minute visit was spent on counseling and coordination of care about stroke, migraine, seizure and essential tremors  Antony Contras, MD Medical Director [place] Pager: [phone] 02/18/2016 4:33 PM  Mikey Bussing PA-C [facility] Pager [phone] 02/18/2016, 2:55 PM   To contact Stroke Continuity provider, please refer to [email or web address] After hours, contact General Neurology

## 2016-02-18 NOTE — Discharge Summary (Signed)
Physician Discharge Summary  Alyssa Mccarthy [id number] DOB: 13-Oct-1965 DOA: 02/17/2016  PCP: No PCP Per Patient  Admit date: 02/17/2016 Discharge date: 02/18/2016  Admitted From: home Disposition:  home  Recommendations for Outpatient Follow-up:  1. Follow up with Dr. [name] in 1 month 2. Started on Aspirin 325  3. Lipitor increased to 40 mg   Home Health: none Equipment/Devices: none  Outpatient PT on d/c  Discharge Condition: stable CODE STATUS: Full code Diet recommendation: heart healthy  HPI: Per Dr. Alcario Drought,  Alyssa Mccarthy is a 51 y.o. female with medical history significant of seizure disorder, essential tremor.  Patient presents to the ED from radiology after developing facial droop and some R sided facial weakness.  Patient developed severe headache on 1/21, initially on the left side but quickly moving over to the right side and settling around the right eye. No other symptoms were reported. Over-the-counter analgesics with improvement in headache. The next day, the headache returned. Again, it started on the left forehead and quickly moved to settle around the right eye. She states that she has had some nausea but no other symptoms with the headache. She did not appreciate any focal neurological deficits. Due to persistent headache patient saw her Neurologist, Dr. [name], who evaluated her and became concerned due to finding of Ansiocoria on exam.  Due to concern for possible carotid artery dissection vs PCOM aneurysm vs venous thrombosis, patient was sent for CTA head and neck. While in the radiology waiting room, she was noted to have L facial droop and was taken to the ED as a code stroke.  She reports that she has some weakness in L face and R arm.  New symptoms onset around 1515 today. Symptoms accompanied by severe headache again located about the R eye.   Hospital Course: Discharge Diagnoses:  Principal Problem:   Stroke-like symptoms Active Problems:   Anisocoria  Epilepsy (HCC)   Essential tremor   HLD (hyperlipidemia)   Headache  Stroke like symptoms / probable CVA - Patient's was admitted to the hospital with strokelike symptoms as well as headache and concerns for possible coronary artery dissection. Neurology was consulted and have evaluated patient while hospitalized. She underwent a CT angiogram which was essentially normal without any evidence of dissection (full read below). She underwent a regular stroke workup, had a 2-D echo done which showed normal ejection fraction of 60-65%, LDL was found to be slightly elevated 112. She was placed on aspirin 325 mg daily, and her Lipitor was increased from 20 mg daily to 40 mg daily. She was evaluated by physical therapy who recommended outpatient PT, and this was arranged prior to discharge. Could not get an MRI due to presence of DBS and could not have confirmation for a CVA, however this is most likely diagnosis. Complicated migraine is also possible, however thought to be less likely. Epilepsy - stable, resume home medication on discharge, outpatient follow-up with neurology  Essential tremor - DBS present Hyperlipidemia - increase her statin dose on discharge as below.  No other medication changes were made to her chronic regimen and all her chronic medical problems have remained stable  Discharge Instructions  Discharge Instructions    Ambulatory referral to Physical Therapy    Complete by:  As directed      Allergies as of 02/18/2016      Reactions   Gluten Meal Diarrhea   Severe diarrhea   Other Other (See Comments)   Bleach-respiratory distress  Plavix [clopidogrel Bisulfate] Itching, Rash   Hands and feet    Penicillins Itching, Rash, Other (See Comments)   Childhood allergy- specifics unknown; tolerated amoxicillin and ancef since then       Medication List    TAKE these medications   aspirin 325 MG EC tablet Take 1 tablet (325 mg total) by mouth daily. Start taking on:   02/19/2016   atorvastatin 40 MG tablet Commonly known as:  LIPITOR Take 1 tablet (40 mg total) by mouth every evening. What changed:  medication strength  how much to take   clonazePAM 0.5 MG tablet Commonly known as:  KLONOPIN Take 0.5-1 mg by mouth daily as needed for seizure.   dicyclomine 10 MG capsule Commonly known as:  BENTYL Take 10 mg by mouth daily as needed for spasms.   divalproex 500 MG 24 hr tablet Commonly known as:  DEPAKOTE ER Take 500 mg by mouth at bedtime.   HYDROcodone-acetaminophen 5-325 MG tablet Commonly known as:  NORCO/VICODIN Take 1 tablet by mouth every 6 (six) hours as needed (headache).   ibuprofen 200 MG tablet Commonly known as:  ADVIL,MOTRIN Take 400 mg by mouth every 6 (six) hours as needed for headache or moderate pain.   lamoTRIgine 100 MG tablet Commonly known as:  LAMICTAL Take 100 mg by mouth 2 (two) times daily.   levETIRAcetam 500 MG 24 hr tablet Commonly known as:  KEPPRA XR Take 2,500 mg by mouth daily.   levothyroxine 88 MCG tablet Commonly known as:  SYNTHROID, LEVOTHROID Take 88 mcg by mouth daily before breakfast.   naproxen sodium 220 MG tablet Commonly known as:  ANAPROX Take 220 mg by mouth 2 (two) times daily as needed (pain).   primidone 50 MG tablet Commonly known as:  MYSOLINE Take 100-150 mg by mouth See admin instructions. Takes 2 tabs in am and 3 tabs in pm   REWETTING DROPS Soln Apply 1 drop to eye 3 (three) times daily as needed (for contacts).   sertraline 50 MG tablet Commonly known as:  ZOLOFT Take 100 mg by mouth daily.   UNABLE TO FIND Outpatient PT   Vitamin D (Ergocalciferol) 50000 units Caps capsule Commonly known as:  DRISDOL Take 50,000 Units by mouth every Sunday.   zolpidem 10 MG tablet Commonly known as:  AMBIEN Take 10 mg by mouth at bedtime.      Follow-up Information    [name], MATTHEW, MD. Schedule an appointment as soon as possible for a visit in 4 week(s).   Specialty:   Neurology Contact information: [place] [id number] [place] Follow up.   Specialty:  Rehabilitation Why:  Office will call with appointment date and time Contact information: [place] [id number] [place] [id number] [phone]         Allergies  Allergen Reactions  . Gluten Meal Diarrhea    Severe diarrhea  . Other Other (See Comments)    Bleach-respiratory distress  . Plavix [Clopidogrel Bisulfate] Itching and Rash    Hands and feet   . Penicillins Itching, Rash and Other (See Comments)    Childhood allergy- specifics unknown; tolerated amoxicillin and ancef since then     Consultations:  Neurology   Procedures/Studies:  2D echo  Study Conclusions - Left ventricle: The cavity size was normal. Wall thickness was normal. Systolic function was normal. The estimated ejection fraction was in the range of 55%  to 60%. Wall motion was normal; there were no regional wall motion abnormalities. Doppler parameters are consistent with abnormal left ventricular relaxation (grade 1 diastolic dysfunction).  Ct Angio Head W Or Wo Contrast  Result Date: 02/17/2016 CLINICAL DATA:  51 y/o  F; right facial droop. EXAM: CT ANGIOGRAPHY HEAD AND NECK TECHNIQUE: Multidetector CT imaging of the head and neck was performed using the standard protocol during bolus administration of intravenous contrast. Multiplanar CT image reconstructions and MIPs were obtained to evaluate the vascular anatomy. Carotid stenosis measurements (when applicable) are obtained utilizing NASCET criteria, using the distal internal carotid diameter as the denominator. CONTRAST:  50 cc Isovue 370 COMPARISON:  02/17/2016 CT of the head. FINDINGS: CTA NECK FINDINGS Aortic arch: Standard branching. Imaged portion shows no evidence of aneurysm or dissection. No significant stenosis of the major arch  vessel origins. Mild atherosclerosis with calcification. Right carotid system: No evidence of dissection, stenosis (50% or greater) or occlusion. Small fibrofatty plaque of the carotid bifurcation with a 2 mm posteriorly directed outpouching probably representing a small plaque ulceration (series 404, image 106). Left carotid system: No evidence of dissection, stenosis (50% or greater) or occlusion. Vertebral arteries: Left dominant. No evidence of dissection, stenosis (50% or greater) or occlusion. Skeleton: Mild cervical spondylosis with discogenic degenerative changes most pronounced at the C5-6 level and mild upper cervical facet arthrosis. No high-grade bony canal stenosis or foraminal narrowing. Other neck: Negative. Upper chest: Negative. Review of the MIP images confirms the above findings CTA HEAD FINDINGS Anterior circulation: No significant stenosis, proximal occlusion, aneurysm, or vascular malformation. Minimal calcific atherosclerosis of cavernous segments of ICA. Posterior circulation: No significant stenosis, proximal occlusion, aneurysm, or vascular malformation. Proximal basilar fenestration. Venous sinuses: As permitted by contrast timing, patent. Anatomic variants: Fetal left posterior cerebral artery. No right posterior communicating artery identified, likely hypoplastic or absent. Large right A1 segment and azygos A2 anterior cerebral artery, normal variant. Review of the MIP images confirms the above findings IMPRESSION: 1. Patent carotid and vertebral arteries of the neck without evidence for dissection, hemodynamically significant stenosis, or occlusion. 2. Patent circle of Willis without evidence for large vessel occlusion, aneurysm, significant stenosis, or vascular malformation. 3. Small fibrofatty plaque of the right carotid bifurcation with a 2 mm posteriorly directed outpouching probably representing a small plaque ulceration. 4. Mild aortic atherosclerosis. 5. Mild cervical  spondylosis. These results were called by telephone at the time of interpretation on 02/17/2016 at 5:29 pm to Dr. Melba Coon , who verbally acknowledged these results. Electronically Signed   By: Kristine Garbe M.D.   On: 02/17/2016 17:30   Ct Head Wo Contrast  Result Date: 02/17/2016 CLINICAL DATA:  Slurred speech, history of prior neurosurgery EXAM: CT HEAD WITHOUT CONTRAST TECHNIQUE: Contiguous axial images were obtained from the base of the skull through the vertex without intravenous contrast. COMPARISON:  CT brain scan of 02/14/2016 FINDINGS: Brain: The ventricular system is unchanged in size and configuration and the septum remains midline in position. Deep brain stimulator electrodes are unchanged in position. The fourth ventricle and basilar cisterns appear normal. No hemorrhage, mass lesion, or acute infarction is seen. Vascular: No vascular abnormality is seen on this unenhanced study. Skull: No calvarial abnormality is noted. Sinuses/Orbits: The paranasal sinuses that are visualized are pneumatized. Other: None. IMPRESSION: 1. Negative unenhanced CT of the brain for acute abnormality. 2. Deep brain stimulator electrodes are unchanged in position. Electronically Signed   By: Ivar Drape M.D.   On: 02/17/2016 16:50  Ct Head W Or Wo Contrast  Result Date: 02/14/2016 CLINICAL DATA:  Severe headaches beginning yesterday, slurred speech. EXAM: CT HEAD WITHOUT AND WITH CONTRAST TECHNIQUE: Contiguous axial images were obtained from the base of the skull through the vertex without and with intravenous contrast CONTRAST:  52mL ISOVUE-300 IOPAMIDOL (ISOVUE-300) INJECTION 61% COMPARISON:  CT HEAD May 11, 2010 FINDINGS: BRAIN: Streak artifact from bilateral deep brain stimulator leads which terminates in the region of the hypothalamus. The ventricles and sulci are normal. No intraparenchymal hemorrhage, mass effect nor midline shift. No acute large vascular territory infarcts. No abnormal  extra-axial fluid collections. Basal cisterns are patent. VASCULAR: Unremarkable. SKULL/SOFT TISSUES: No skull fracture. Bifrontal burr holes. No significant soft tissue swelling. ORBITS/SINUSES: The included ocular globes and orbital contents are normal.The mastoid aircells and included paranasal sinuses are well-aerated. OTHER: None. IMPRESSION: Bilateral DBS, otherwise negative CT HEAD for age with and without contrast. Electronically Signed   By: Elon Alas M.D.   On: 02/14/2016 20:23   Ct Angio Neck W Or Wo Contrast  Result Date: 02/17/2016 CLINICAL DATA:  51 y/o  F; right facial droop. EXAM: CT ANGIOGRAPHY HEAD AND NECK TECHNIQUE: Multidetector CT imaging of the head and neck was performed using the standard protocol during bolus administration of intravenous contrast. Multiplanar CT image reconstructions and MIPs were obtained to evaluate the vascular anatomy. Carotid stenosis measurements (when applicable) are obtained utilizing NASCET criteria, using the distal internal carotid diameter as the denominator. CONTRAST:  50 cc Isovue 370 COMPARISON:  02/17/2016 CT of the head. FINDINGS: CTA NECK FINDINGS Aortic arch: Standard branching. Imaged portion shows no evidence of aneurysm or dissection. No significant stenosis of the major arch vessel origins. Mild atherosclerosis with calcification. Right carotid system: No evidence of dissection, stenosis (50% or greater) or occlusion. Small fibrofatty plaque of the carotid bifurcation with a 2 mm posteriorly directed outpouching probably representing a small plaque ulceration (series 404, image 106). Left carotid system: No evidence of dissection, stenosis (50% or greater) or occlusion. Vertebral arteries: Left dominant. No evidence of dissection, stenosis (50% or greater) or occlusion. Skeleton: Mild cervical spondylosis with discogenic degenerative changes most pronounced at the C5-6 level and mild upper cervical facet arthrosis. No high-grade bony  canal stenosis or foraminal narrowing. Other neck: Negative. Upper chest: Negative. Review of the MIP images confirms the above findings CTA HEAD FINDINGS Anterior circulation: No significant stenosis, proximal occlusion, aneurysm, or vascular malformation. Minimal calcific atherosclerosis of cavernous segments of ICA. Posterior circulation: No significant stenosis, proximal occlusion, aneurysm, or vascular malformation. Proximal basilar fenestration. Venous sinuses: As permitted by contrast timing, patent. Anatomic variants: Fetal left posterior cerebral artery. No right posterior communicating artery identified, likely hypoplastic or absent. Large right A1 segment and azygos A2 anterior cerebral artery, normal variant. Review of the MIP images confirms the above findings IMPRESSION: 1. Patent carotid and vertebral arteries of the neck without evidence for dissection, hemodynamically significant stenosis, or occlusion. 2. Patent circle of Willis without evidence for large vessel occlusion, aneurysm, significant stenosis, or vascular malformation. 3. Small fibrofatty plaque of the right carotid bifurcation with a 2 mm posteriorly directed outpouching probably representing a small plaque ulceration. 4. Mild aortic atherosclerosis. 5. Mild cervical spondylosis. These results were called by telephone at the time of interpretation on 02/17/2016 at 5:29 pm to Dr. Melba Coon , who verbally acknowledged these results. Electronically Signed   By: Kristine Garbe M.D.   On: 02/17/2016 17:30     Subjective: - no chest  pain, shortness of breath, no abdominal pain, nausea or vomiting.   Discharge Exam: Vitals:   02/18/16 0800 02/18/16 1437  BP: 116/75 (!) 146/65  Pulse: 69 75  Resp: 18 20  Temp: 98.2 F (36.8 C) 98.7 F (37.1 C)   Vitals:   02/18/16 0200 02/18/16 0400 02/18/16 0800 02/18/16 1437  BP: 129/73 101/60 116/75 (!) 146/65  Pulse: 60 (!) 57 69 75  Resp: 18 18 18 20   Temp:   98.2 F  (36.8 C) 98.7 F (37.1 C)  TempSrc:   Oral Oral  SpO2: 94% 97% 96% 96%  Weight:      Height:        General: Pt is alert, awake, not in acute distress Cardiovascular: RRR, S1/S2 +, no rubs, no gallops Respiratory: CTA bilaterally, no wheezing, no rhonchi Abdominal: Soft, NT, ND, bowel sounds + Extremities: no edema, no cyanosis    The results of significant diagnostics from this hospitalization (including imaging, microbiology, ancillary and laboratory) are listed below for reference.     Microbiology: No results found for this or any previous visit (from the past 240 hour(s)).   Labs: BNP (last 3 results) No results for input(s): BNP in the last 8760 hours. Basic Metabolic Panel:  Recent Labs Lab 02/14/16 1852 02/17/16 1651 02/17/16 1705  NA 142 140 141  K 3.8 3.5 3.5  CL 102 105 101  CO2  --  26  --   GLUCOSE 105* 84 83  BUN 9 13 15   CREATININE 0.70 0.78 0.80  CALCIUM  --  9.5  --    Liver Function Tests:  Recent Labs Lab 02/17/16 1651  AST 17  ALT 14  ALKPHOS 59  BILITOT 0.5  PROT 6.8  ALBUMIN 4.3   No results for input(s): LIPASE, AMYLASE in the last 168 hours. No results for input(s): AMMONIA in the last 168 hours. CBC:  Recent Labs Lab 02/14/16 1852 02/17/16 1651 02/17/16 1705  WBC  --  4.9  --   NEUTROABS  --  2.7  --   HGB 12.9 13.5 12.9  HCT 38.0 39.4 38.0  MCV  --  93.1  --   PLT  --  149*  --    Cardiac Enzymes: No results for input(s): CKTOTAL, CKMB, CKMBINDEX, TROPONINI in the last 168 hours. BNP: Invalid input(s): POCBNP CBG: No results for input(s): GLUCAP in the last 168 hours. D-Dimer No results for input(s): DDIMER in the last 72 hours. Hgb A1c No results for input(s): HGBA1C in the last 72 hours. Lipid Profile  Recent Labs  02/18/16 0318  CHOL 189  HDL 49  LDLCALC 112*  TRIG 138  CHOLHDL 3.9   Thyroid function studies No results for input(s): TSH, T4TOTAL, T3FREE, THYROIDAB in the last 72 hours.  Invalid  input(s): FREET3 Anemia work up No results for input(s): VITAMINB12, FOLATE, FERRITIN, TIBC, IRON, RETICCTPCT in the last 72 hours. Urinalysis    Component Value Date/Time   COLORURINE YELLOW 05/11/2010 0127   APPEARANCEUR CLEAR 05/11/2010 0127   LABSPEC 1.013 05/11/2010 0127   PHURINE 6.5 05/11/2010 0127   GLUCOSEU NEGATIVE 05/11/2010 0127   HGBUR NEGATIVE 05/11/2010 0127   BILIRUBINUR NEGATIVE 05/11/2010 0127   KETONESUR TRACE (A) 05/11/2010 0127   PROTEINUR NEGATIVE 05/11/2010 0127   UROBILINOGEN 1.0 05/11/2010 0127   NITRITE NEGATIVE 05/11/2010 0127   LEUKOCYTESUR  05/11/2010 0127    NEGATIVE MICROSCOPIC NOT DONE ON URINES WITH NEGATIVE PROTEIN, BLOOD, LEUKOCYTES, NITRITE, OR GLUCOSE <1000 mg/dL.  Sepsis Labs Invalid input(s): PROCALCITONIN,  WBC,  LACTICIDVEN Microbiology No results found for this or any previous visit (from the past 240 hour(s)).   Time coordinating discharge: Over 30 minutes  SIGNED:  Marzetta Board, MD  [facility] 02/18/2016, 4:45 PM Pager [phone]  If 7PM-7AM, please contact night-coverage [email or web address] Password [email or web address]

## 2016-02-18 NOTE — Evaluation (Signed)
Speech Language Pathology Evaluation Patient Details Name: Alyssa Mccarthy MRN: [id number] DOB: 03-Jan-1966 Today's Date: 02/18/2016 Time: [id number] SLP Time Calculation (min) (ACUTE ONLY): 31 min  Problem List:  Patient Active Problem List   Diagnosis Date Noted  . Anisocoria 02/17/2016  . Epilepsy ([place]) 02/17/2016  . Essential tremor 02/17/2016  . HLD (hyperlipidemia) 02/17/2016  . Headache 02/17/2016  . Stroke-like symptoms 02/17/2016   Past Medical History:  Past Medical History:  Diagnosis Date  . Essential tremor   . Hyperlipidemia   . Seizure disorder [facility])    Past Surgical History:  Past Surgical History:  Procedure Laterality Date  . ABDOMINAL HYSTERECTOMY    . APPENDECTOMY    . BRAIN SURGERY    . CARPAL TUNNEL RELEASE    . TOTAL KNEE ARTHROPLASTY     HPI:  51 y.o. female with medical history significant of seizure disorder, essential tremor.  Patient presents to the ED from radiology after developing facial droop and some R sided facial weakness.  Patient developed severe headache on 1/21, initially on the left side but quickly moving over to the right side and settling around the right eye. No other symptoms were reported   Assessment / Plan / Recommendation Clinical Impression  Pt scored a 26/30 on the MoCA and denies any acute cognitive changes. Her speech is intelligible at the conversational level, but she reports having to speak slowly and deliberately to compensate for imprecise speech. She appears to have mild difficulty with the coordination of her speech. Recommend that pt f/u with OP SLP services upon d/c should symptoms persist. SLP to sign off acutely.    SLP Assessment  All further Speech Lanaguage Pathology  needs can be addressed in the next venue of care    Follow Up Recommendations  Outpatient SLP    Frequency and Duration           SLP Evaluation Cognition  Overall Cognitive Status: Within Functional Limits for tasks assessed        Comprehension  Auditory Comprehension Overall Auditory Comprehension: Appears within functional limits for tasks assessed Visual Recognition/Discrimination Discrimination: Within Function Limits    Expression Expression Primary Mode of Expression: Verbal Verbal Expression Overall Verbal Expression: Appears within functional limits for tasks assessed Written Expression Dominant Hand: Right   Oral / Motor  Motor Speech Overall Motor Speech: Impaired (coordination) Respiration: Within functional limits Phonation: Normal Resonance: Within functional limits Articulation: Impaired Level of Impairment: Conversation Intelligibility: Intelligible   GO          Functional Assessment Tool Used: skilled clinical judgment Functional Limitations: Motor speech Motor Speech Current Status [phone]): At least 1 percent but less than 20 percent impaired, limited or restricted Motor Speech Goal Status [phone]): At least 1 percent but less than 20 percent impaired, limited or restricted Motor Speech Goal Status [phone]): At least 1 percent but less than 20 percent impaired, limited or restricted         Germain Osgood 02/18/2016, 3:05 PM  Germain Osgood, M.A. CCC-SLP [phone]

## 2016-02-18 NOTE — Progress Notes (Signed)
Initial Nutrition Assessment   INTERVENTION:  Multivitamin with minerals daily Discussed Gluten-Free options in hospital Encouraged increased intake of fruits and vegetables  Provide Premier Protein Shake once daily   NUTRITION DIAGNOSIS:   Predicted suboptimal nutrient intake related to poor appetite as evidenced by per patient/family report.   GOAL:   Patient will meet greater than or equal to 90% of their needs   MONITOR:   PO intake, Labs, Weight trends, Skin  REASON FOR ASSESSMENT:   Malnutrition Screening Tool    ASSESSMENT:   51 y.o. female with medical history significant of seizure disorder, essential tremor.  Patient presents to the ED from radiology after developing facial droop and some R sided facial weakness.  Patient developed severe headache on 1/21, initially on the left side but quickly moving over to the right side and settling around the right eye.   Pt states that she has had a very poor appetite for the past 6 months, eating about 25% less than usual. She drinks a Premier protein shake for breakfast, snacks on almonds and black coffee midday, and usually eats a good dinner. She reports that she started eating less since going gluten-free due to gluten intolerance that causes diarrhea. She used to eat a bagel every morning, but no longer can. She states she has lost 9 lbs in the past 6 months-6% wt loss is not significant for time frame. Pt has no muscle or fat wasting on exam, however she was noted to have pale mucous membranes (? Iron, B12 or folate deficiency) and white transverse lines on two finger nails (?Muehrcke's lines). It's possible that patient has had poor absorption of protein and vitamins/minerals due to frequent diarrhea from gluten consumption. She states that she took a probiotic for a short period of time, but it didn't seem to help. RD recommended re-starting probiotic as well as increasing intake of insoluble fiber. Recommended taking a  multivitamin daily and continuing Premier Protein shake daily until PO intake at meals improves.  Pt has not eaten much today due to limited food options and poor appetite. RD provided list of Gluten-Free foods offered by kitchen.   Labs reviewed.   Diet Order:  Diet gluten free Room service appropriate? Yes; Fluid consistency: Thin  Skin:  Reviewed, no issues  Last BM:  1/25  Height:   Ht Readings from Last 1 Encounters:  02/17/16 5\' 2"  (1.575 m)    Weight:   Wt Readings from Last 1 Encounters:  02/17/16 152 lb (68.9 kg)    Ideal Body Weight:  50 kg  BMI:  Body mass index is 27.8 kg/m.  Estimated Nutritional Needs:   Kcal:  1600-1800  Protein:  60-70 grams  Fluid:  2 L/day  EDUCATION NEEDS:   No education needs identified at this time  [place], CSP, LDN Inpatient Clinical Dietitian Pager: [phone] After Hours Pager: [phone]

## 2016-02-18 NOTE — Evaluation (Signed)
Occupational Therapy Evaluation Patient Details Name: Alyssa Mccarthy MRN: [id number] DOB: 08-Mar-1965 Today's Date: 02/18/2016    History of Present Illness  Alyssa Mccarthy is a 51 y.o. female with medical history significant of seizure disorder, essential tremor.  Patient presents to the ED from radiology after developing facial droop and some R sided facial weakness.  Patient developed severe headache on 1/21, initially on the left side but quickly moving over to the right side and settling around the right eye. No other symptoms were reported   Clinical Impression   Pt at baseline independent level with ADLs and ADL mobility, however pt with R eye vision impairments, decrease sensation of R UE and R facial droop. Pt's R visual files/periprhery impaired with R pupil dilated, however pt able to track [facility] and with no reports of double or blurred vision. Pt and her family educate don the safety risks of R eye impairments with ADLs, IADLs, mobility and driving. Pt would benefit from acute OT services to address visual deficits    Follow Up Recommendations  No OT follow up    Equipment Recommendations  None recommended by OT    Recommendations for Other Services       Precautions / Restrictions Precautions Precautions: Other (comment) (vision impaired) Precaution Comments: vision impaired in R eye Restrictions Weight Bearing Restrictions: No      Mobility Bed Mobility Overal bed mobility: Independent                Transfers Overall transfer level: Independent Equipment used: None             General transfer comment: no balance/coordination impairments observed    Balance Overall balance assessment: No apparent balance deficits (not formally assessed)                                          ADL Overall ADL's : At baseline;Independent                                             Vision Vision Assessment?: Vision impaired-  to be further tested in functional context Eye Alignment: Within Functional Limits Ocular Range of Motion: Within Functional Limits Alignment/Gaze Preference: Within Defined Limits Visual Fields: Right visual field deficit Additional Comments: R pupil dilated/anisocoria              Pertinent Vitals/Pain Pain Assessment: 0-10 Pain Score: 5  Pain Location: headache Pain Descriptors / Indicators: Constant Pain Intervention(s): Monitored during session     Hand Dominance Right   Extremity/Trunk Assessment Upper Extremity Assessment Upper Extremity Assessment: RUE deficits/detail RUE Deficits / Details: no GMC/FMC impairments RUE Sensation: decreased light touch   Lower Extremity Assessment Lower Extremity Assessment: Defer to PT evaluation   Cervical / Trunk Assessment Cervical / Trunk Assessment: Normal   Communication Communication Communication: No difficulties   Cognition Arousal/Alertness: Awake/alert Behavior During Therapy: WFL for tasks assessed/performed Overall Cognitive Status: Within Functional Limits for tasks assessed                     General Comments   pt very pleasant and cooperative                 Home Living Family/patient expects to be  discharged to:: Private residence Living Arrangements: Spouse/significant other;Children Available Help at Discharge: Family Type of Home: House Home Access: Stairs to enter Technical brewer of Steps: 1   Home Layout: One level     Bathroom Shower/Tub: Tub/shower unit;Walk-in shower   Bathroom Toilet: Handicapped height     Home Equipment: None          Prior Functioning/Environment Level of Independence: Independent                 OT Problem List: Impaired vision/perception;Pain   OT Treatment/Interventions:      OT Goals(Current goals can be found in the care plan section) Acute Rehab OT Goals Patient Stated Goal: go home OT Goal Formulation: With  patient/family Time For Goal Achievement: 02/25/16 Potential to Achieve Goals: Good ADL Goals Additional ADL Goal #1: Pt/family education for exercises/activities and compensatory techniques to increase safety in R visual field/periphery Additional ADL Goal #2: Pt awareness to rotate head/neck to R side to increase safety during functional acitivities and mobility   OT Frequency:     Barriers to D/C:  no barriers                        End of Session    Activity Tolerance: Patient tolerated treatment well Patient left: in bed;with call bell/phone within reach;with family/visitor present   Time: [id number] OT Time Calculation (min): 30 min Charges:  OT General Charges $OT Visit: 1 Procedure OT Evaluation $OT Eval Moderate Complexity: 1 Procedure OT Treatments $Therapeutic Activity: 8-22 mins G-Codes: OT G-codes **NOT FOR INPATIENT CLASS** Functional Assessment Tool Used: clinical judgement Functional Limitation: Self care Self Care Current Status [id number]): 0 percent impaired, limited or restricted Self Care Goal Status [id number]): 0 percent impaired, limited or restricted  Britt Bottom 02/18/2016, 12:58 PM

## 2016-02-18 NOTE — Evaluation (Signed)
Physical Therapy Evaluation Patient Details Name: Alyssa Mccarthy MRN: [id number] DOB: 11-Feb-1965 Today's Date: 02/18/2016   History of Present Illness   Alyssa Mccarthy is a 51 y.o. female with medical history significant of seizure disorder, essential tremor.  Patient presents to the ED from radiology after developing facial droop and some R sided facial weakness.  Patient developed severe headache on 1/21, initially on the left side but quickly moving over to the right side and settling around the right eye. No other symptoms were reported. PMH: seizures, essential tremors  Clinical Impression  At this time the pt is grossly mobilizing well and is independent with her bed mobility and ambulation on flat/level surface. Deficits were noted with high level balance activities including head turns right, single leg stance, and narrow base of support. Discussed with pt and verbalizes understanding and relationship to safety at home. Recommending outpatient (neuro) services to address high level balance deficits if not improved. Pt in agreement. Will D/C to home with spouse to assist.     Follow Up Recommendations Outpatient PT (neuro)    Equipment Recommendations  None recommended by PT    Recommendations for Other Services       Precautions / Restrictions Precautions Precautions: Other (comment) (vision impaired) Precaution Comments: vision impaired in R eye Restrictions Weight Bearing Restrictions: No      Mobility  Bed Mobility Overal bed mobility: Independent             General bed mobility comments: bed flat  Transfers Overall transfer level: Independent Equipment used: None             General transfer comment: good stability  Ambulation/Gait Ambulation/Gait assistance: Independent Ambulation Distance (Feet): 500 Feet Assistive device: None Gait Pattern/deviations: Step-through pattern Gait velocity: WFL, decreased with fatigue and head rotations   General Gait  Details: Pt with grossly good stability but noted loss of balance with head rotation rt. Able to perform quick stop and change of direction safely but decreased speed.   Stairs Stairs: Yes Stairs assistance: Supervision Stair Management: No rails;Alternating pattern;Forwards Number of Stairs: 5 General stair comments: good stability but slower pattern  Wheelchair Mobility    Modified Rankin (Stroke Patients Only) Modified Rankin (Stroke Patients Only) Pre-Morbid Rankin Score: No symptoms Modified Rankin: Slight disability     Balance Overall balance assessment: Needs assistance Sitting-balance support: No upper extremity supported Sitting balance-Leahy Scale: Normal     Standing balance support: No upper extremity supported Standing balance-Leahy Scale: Good   Single Leg Stance - Right Leg:  (able to perform with decreased stability) Single Leg Stance - Left Leg:  (good stability) Tandem Stance - Right Leg:  (decreased stability, min support provided) Tandem Stance - Left Leg:  (decreased stability, min support provided)   Rhomberg - Eyes Closed:  (increased sway but no loss of balance) High level balance activites: Direction changes;Turns;Sudden stops;Head turns High Level Balance Comments: Pt with decreased stability through Rt LE with SLS, head rotations rt, narrow base of support and eyes closed activity.              Pertinent Vitals/Pain Pain Assessment: No/denies pain Pain Score: 5  Faces Pain Scale: Hurts a little bit Pain Location: headache Pain Descriptors / Indicators: Constant Pain Intervention(s): Limited activity within patient's tolerance;Monitored during session    Home Living Family/patient expects to be discharged to:: Private residence Living Arrangements: Spouse/significant other;Children Available Help at Discharge: Family Type of Home: House Home Access: Stairs to enter  Entrance Stairs-Rails: None Entrance Stairs-Number of Steps: 1 Home  Layout: One level Home Equipment: None      Prior Function Level of Independence: Independent         Comments: reports working at [employer]: Right    Extremity/Trunk Assessment   Upper Extremity Assessment Upper Extremity Assessment: Defer to OT evaluation RUE Deficits / Details: no GMC/FMC impairments RUE Sensation: decreased light touch    Lower Extremity Assessment Lower Extremity Assessment: RLE deficits/detail RLE Deficits / Details: mild decreased coordination, no foot drop noted with ambulation    Cervical / Trunk Assessment Cervical / Trunk Assessment: Normal  Communication   Communication: No difficulties  Cognition Arousal/Alertness: Awake/alert Behavior During Therapy: WFL for tasks assessed/performed Overall Cognitive Status: Within Functional Limits for tasks assessed                      General Comments      Exercises     Assessment/Plan    PT Assessment Patient needs continued PT services  PT Problem List Decreased strength;Decreased activity tolerance;Decreased balance;Decreased mobility;Decreased coordination          PT Treatment Interventions Gait training;Stair training;Functional mobility training;Therapeutic activities;Therapeutic exercise;Balance training;Neuromuscular re-education;Patient/family education    PT Goals (Current goals can be found in the Care Plan section)  Acute Rehab PT Goals Patient Stated Goal: go home PT Goal Formulation: With patient Time For Goal Achievement: 02/25/16 Potential to Achieve Goals: Good    Frequency Min 4X/week   Barriers to discharge        Co-evaluation               End of Session Equipment Utilized During Treatment: Gait belt Activity Tolerance: Patient tolerated treatment well Patient left: in bed;with call bell/phone within reach;with family/visitor present Nurse Communication: Mobility status         Time: [id number] PT  Time Calculation (min) (ACUTE ONLY): 27 min   Charges:   PT Evaluation $PT Eval Moderate Complexity: 1 Procedure PT Treatments $Neuromuscular Re-education: 8-22 mins   PT G Codes:        Cassell Clement, PT, CSCS Pager [phone] Office [phone]  02/18/2016, 3:22 PM

## 2016-02-18 NOTE — Progress Notes (Signed)
SLP Cancellation Note  Patient Details Name: Alyssa Mccarthy MRN: [id number] DOB: 1965-09-05   Cancelled treatment:       Reason Eval/Treat Not Completed: Patient at procedure or test/unavailable. Pt leaving for ECHO as SLP arrived. She does report that her speech remains slurred. SLP will f/u as able to complete speech/language evaluation.   Germain Osgood 02/18/2016, 9:35 AM  Germain Osgood, M.A. CCC-SLP [phone]

## 2016-02-18 NOTE — Progress Notes (Signed)
Discharge orders received.  Discharge instructions and follow-up appointments reviewed with the patient and her husband.  VSS upon discharge.  IV removed and education complete.  Transported out via wheelchair.   Carmina Walle M, RN 

## 2016-02-18 NOTE — Care Management Note (Signed)
Case Management Note  Patient Details  Name: Alyssa Mccarthy MRN: [id number] Date of Birth: Apr 11, 1965  Subjective/Objective:            Patient presented to the ED with stroke-like symptoms. Lives at home with spouse, independent with ADLs.  CM will follow for discharge needs pending PT/OT evals and physician orders.      Action/Plan:   Expected Discharge Date:                  Expected Discharge Plan:     In-House Referral:     Discharge planning Services     Post Acute Care Choice:    Choice offered to:     DME Arranged:    DME Agency:     HH Arranged:    HH Agency:     Status of Service:     If discussed at [employer] of Stay Meetings, dates discussed:    Additional Comments:  Rolm Baptise, RN 02/18/2016, 10:48 AM

## 2016-02-19 LAB — HEMOGLOBIN A1C
Hgb A1c MFr Bld: 4.9 % (ref 4.8–5.6)
Mean Plasma Glucose: 94 mg/dL

## 2016-02-21 MED FILL — ATORVASTATIN 40 MG TABLET: 40 | 57 days supply | Qty: 57 | Fill #0

## 2016-02-22 ENCOUNTER — Encounter: Payer: Self-pay | Admitting: Physical Therapy

## 2016-02-22 ENCOUNTER — Ambulatory Visit: Payer: 59 | Attending: Internal Medicine | Admitting: Physical Therapy

## 2016-02-22 DIAGNOSIS — R29818 Other symptoms and signs involving the nervous system: Secondary | ICD-10-CM | POA: Diagnosis not present

## 2016-02-22 NOTE — Therapy (Signed)
[facility] [address] [place], [place], [address] Phone: [phone]   Fax:  [phone]  Physical Therapy Evaluation  Patient Details  Name: Alyssa Mccarthy MRN: [id number] Date of Birth: [birth year] Referring Provider: [name], PCP [name], Hospitalist)  Encounter Date: 02/22/2016      PT End of Session - 02/22/16 1315    Visit Number 1   Number of Visits 1   Authorization Type UMR   PT Start Time 1232   PT Stop Time 1315   PT Time Calculation (min) 43 min   Activity Tolerance Patient tolerated treatment well   Behavior During Therapy [facility] for tasks assessed/performed      Past Medical History:  Diagnosis Date  . Essential tremor   . Hyperlipidemia   . Seizure disorder [facility])     Past Surgical History:  Procedure Laterality Date  . ABDOMINAL HYSTERECTOMY    . APPENDECTOMY    . BRAIN SURGERY    . CARPAL TUNNEL RELEASE    . TOTAL KNEE ARTHROPLASTY      There were no vitals filed for this visit.       Subjective Assessment - 02/22/16 1236    Subjective This 51yo female developed headache on 02/13/2016. She was seen by neurologist on 02/17/2016 who noted unequal pupils & ordered CT scan. While in radiology she was noted with right facial weakness & slurred speech and sent to ED Code Stroke. She was admitted for 24 hr with probable CVA. Patient was referred to PT for testing for deficits.     Patient is accompained by: Family member   Pertinent History Epilepsy no seizure in >49yrs, Brain surgery for Neurostimulator 2015, Left knee arthroscopic 7 sg and partial knee replacement 2010   Limitations Standing;Walking   Patient Stated Goals Evaluate and determine if issues. Determine if she can drive.    Currently in Pain? No/denies            [facility] PT Assessment - 02/22/16 1230      Assessment   Medical Diagnosis CVA   Referring Provider [name], PCP  Marzetta Board, Hospitalist   Onset Date/Surgical Date 02/17/16   Hand Dominance Right     Precautions   Precautions None     Balance Screen   Has the patient fallen in the past 6 months No   Has the patient had a decrease in activity level because of a fear of falling?  Yes   Is the patient reluctant to leave their home because of a fear of falling?  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, lift & carry 25#, drive, walk on floors   Leisure bowling, reading, walking, gardening     Observation/Other Assessments   Focus on Therapeutic Outcomes (FOTO)  84.38 Functional Status   Activities of Balance Confidence Scale (ABC Scale)  86.3%   Fear Avoidance Belief Questionnaire (FABQ)  19 (5)     Posture/Postural Control   Posture/Postural Control No significant limitations  Tone   Assessment Location Right Upper Extremity;Left Upper Extremity;Right Lower Extremity;Left Lower Extremity     ROM / Strength   AROM / PROM / Strength AROM;Strength     AROM   Overall AROM  Within functional limits for tasks performed     Strength   Overall Strength Within functional limits for tasks performed     Ambulation/Gait   Ambulation/Gait Yes   Ambulation/Gait Assistance 7: Independent   Ambulation Distance (Feet) 1000 Feet   Assistive device None   Gait Pattern Within Functional Limits   Ambulation Surface Indoor;Level   Gait velocity 4.06 ft/sec   Stairs Yes   Stairs Assistance 6: Modified independent (Device/Increase time)   Stair Management Technique No rails;Alternating  pattern;Forwards   Number of Stairs 8   Ramp 7: Independent  no device   Curb 7: Independent  no device     Standardized Balance Assessment   Standardized Balance Assessment Berg Balance Test;Timed Up and Go Test     Berg Balance Test   Sit to Stand Able to stand without using hands and stabilize independently   Standing Unsupported Able to stand safely 2 minutes   Sitting with Back Unsupported but Feet Supported on Floor or Stool Able to sit safely and securely 2 minutes   Stand to Sit Sits safely with minimal use of hands   Transfers Able to transfer safely, minor use of hands   Standing Unsupported with Eyes Closed Able to stand 10 seconds safely   Standing Ubsupported with Feet Together Able to place feet together independently and stand 1 minute safely   From Standing, Reach Forward with Outstretched Arm Can reach confidently >25 cm (10")   From Standing Position, Pick up Object from Floor Able to pick up shoe safely and easily   From Standing Position, Turn to Look Behind Over each Shoulder Looks behind from both sides and weight shifts well   Turn 360 Degrees Able to turn 360 degrees safely in 4 seconds or less   Standing Unsupported, Alternately Place Feet on Step/Stool Able to stand independently and safely and complete 8 steps in 20 seconds   Standing Unsupported, One Foot in Front Able to place foot tandem independently and hold 30 seconds   Standing on One Leg Able to lift leg independently and hold > 10 seconds   Total Score 56     Timed Up and Go Test   Normal TUG (seconds) 6.64  normal range   Cognitive TUG (seconds) 7.35  increase WNL     Functional Gait  Assessment   Gait assessed  Yes   Gait Level Surface Walks 20 ft in less than 5.5 sec, no assistive devices, good speed, no evidence for imbalance, normal gait pattern, deviates no more than 6 in outside of the 12 in walkway width.   Change in Gait Speed Able to smoothly change walking speed without loss of  balance or gait deviation. Deviate no more than 6 in outside of the 12 in walkway width.   Gait with Horizontal Head Turns Performs head turns smoothly with no change in gait. Deviates no more than 6 in outside 12 in walkway width   Gait with Vertical Head Turns Performs head turns with no change in gait. Deviates no more than 6 in outside 12 in walkway width.   Gait and Pivot Turn Pivot turns safely within 3 sec and stops quickly with no loss of balance.   Step Over Obstacle Is able to step over  2 stacked shoe boxes taped together (9 in total height) without changing gait speed. No evidence of imbalance.   Gait with Narrow Base of Support Is able to ambulate for 10 steps heel to toe with no staggering.   Gait with Eyes Closed Walks 20 ft, no assistive devices, good speed, no evidence of imbalance, normal gait pattern, deviates no more than 6 in outside 12 in walkway width. Ambulates 20 ft in less than 7 sec.   Ambulating Backwards Walks 20 ft, no assistive devices, good speed, no evidence for imbalance, normal gait   Steps Alternating feet, no rail.   Total Score 30     RUE Tone   RUE Tone Within Functional Limits     LUE Tone   LUE Tone Within Functional Limits     RLE Tone   RLE Tone Within Functional Limits     LLE Tone   LLE Tone Within Functional Limits     Mini-BESTest: Balance Evaluation Systems Test  2005-2013 [employer]. All rights reserved. ________________________________________________________________________________________Anticipatory_________Subscore_____/6 1. SIT TO STAND Instruction: "Cross your arms across your chest. Try not to use your hands unless you must.Do not let your legs lean against the back of the chair when you stand. Please stand up now." XXX(2) Normal: Comes to stand without use of hands and stabilizes independently. (1) Moderate: Comes to stand WITH use of hands on first attempt. (0) Severe: Unable to stand up from chair  without assistance, OR needs several attempts with use of hands. 2. RISE TO TOES Instruction: "Place your feet shoulder width apart. Place your hands on your hips. Try to rise as high as you can onto your toes. I will count out loud to 3 seconds. Try to hold this pose for at least 3 seconds. Look straight ahead. Rise now." XXX(2) Normal: Stable for 3 s with maximum height. (1) Moderate: Heels up, but not full range (smaller than when holding hands), OR noticeable instability for 3 s. (0) Severe: < 3 s. 3. STAND ON ONE LEG Instruction: "Look straight ahead. Keep your hands on your hips. Lift your leg off of the ground behind you without touching or resting your raised leg upon your other standing leg. Stay standing on one leg as long as you can. Look straight ahead. Lift now." Left: Time in Seconds Trial 1:__11___Trial 2:_20____ [id number]) Normal: 20 s. (1) Moderate: < 20 s. (0) Severe: Unable. Right: Time in Seconds Trial 1:___20__Trial 2:_12____ [id number]) Normal: 20 s. (1) Moderate: < 20 s. (0) Severe: Unable To score each side separately use the trial with the longest time. To calculate the sub-score and total score use the side [left or right] with the lowest numerical score [i.e. the worse side]. ______________________________________________________________________________________Reactive Postural Control___________Subscore:__6___/6 4. COMPENSATORY STEPPING CORRECTION- FORWARD Instruction: "Stand with your feet shoulder width apart, arms at your sides. Lean forward against my hands beyond your forward limits. When I let go, do whatever is necessary, including taking a step, to avoid a fall." XXX(2) Normal: Recovers independently with a single, large step (second realignment step is allowed). (1) Moderate: More than one step used to recover equilibrium. (0) Severe: No step, OR would fall if not caught, OR falls spontaneously. 5. COMPENSATORY STEPPING CORRECTION- BACKWARD Instruction: "Stand  with your feet shoulder width apart, arms at your sides. Lean backward against my hands beyond your backward limits. When I let go, do whatever is necessary, including taking a step, to avoid a fall." XXX(2) Normal: Recovers independently with a  single, large step. (1) Moderate: More than one step used to recover equilibrium. (0) Severe: No step, OR would fall if not caught, OR falls spontaneously. 6. COMPENSATORY STEPPING CORRECTION- LATERAL Instruction: "Stand with your feet together, arms down at your sides. Lean into my hand beyond your sideways limit. When I let go, do whatever is necessary, including taking a step, to avoid a fall." Left XXX(2) Normal: Recovers independently with 1 step (crossover or lateral OK). (1) Moderate: Several steps to recover equilibrium. (0) Severe: Falls, or cannot step. Right XXX(2) Normal: Recovers independently with 1 step (crossover or lateral OK). (1) Moderate: Several steps to recover equilibrium. (0) Severe: Falls, or cannot step. Use the side with the lowest score to calculate sub-score and total score. ____________________________________________________________________________________Sensory Orientation_____________Subscore:_____6____/6 7. STANCE (FEET TOGETHER); EYES OPEN, FIRM SURFACE Instruction: "Place your hands on your hips. Place your feet together until almost touching. Look straight ahead. Be as stable and still as possible, until I say stop." Time in seconds:____>30____ XXX(2) Normal: 30 s. (1) Moderate: < 30 s. (0) Severe: Unable. 8. STANCE (FEET TOGETHER); EYES CLOSED, FOAM SURFACE Instruction: "Step onto the foam. Place your hands on your hips. Place your feet together until almost touching. Be as stable and still as possible, until I say stop. I will start timing when you close your eyes." Time in seconds:___>30_____ XXX(2) Normal: 30 s. (1) Moderate: < 30 s. (0) Severe: Unable. 9. INCLINE- EYES CLOSED Instruction: "Step  onto the incline ramp. Please stand on the incline ramp with your toes toward the top. Place your feet shoulder width apart and have your arms down at your sides. I will start timing when you close your eyes." Time in seconds:___>30_____ XXX(2) Normal: Stands independently 30 s and aligns with gravity. (1) Moderate: Stands independently <30 s OR aligns with surface. (0) Severe: Unable. _________________________________________________________________________________________Dynamic Gait ______Subscore___10_____/10 10. CHANGE IN GAIT SPEED Instruction: "Begin walking at your normal speed, when I tell you 'fast', walk as fast as you can. When I say 'slow', walk very slowly." XXX(2) Normal: Significantly changes walking speed without imbalance. (1) Moderate: Unable to change walking speed or signs of imbalance. (0) Severe: Unable to achieve significant change in walking speed AND signs of imbalance. [place] - HORIZONTAL Instruction: "Begin walking at your normal speed, when I say "right", turn your head and look to the right. When I say "left" turn your head and look to the left. Try to keep yourself walking in a straight line." XXX(2) Normal: performs head turns with no change in gait speed and good balance. (1) Moderate: performs head turns with reduction in gait speed. (0) Severe: performs head turns with imbalance. 12. WALK WITH PIVOT TURNS Instruction: "Begin walking at your normal speed. When I tell you to 'turn and stop', turn as quickly as you can, face the opposite direction, and stop. After the turn, your feet should be close together." XXX(2) Normal: Turns with feet close FAST (< 3 steps) with good balance. (1) Moderate: Turns with feet close SLOW (>4 steps) with good balance. (0) Severe: Cannot turn with feet close at any speed without imbalance. 13. STEP OVER OBSTACLES Instruction: "Begin walking at your normal speed. When you get to the box, step over it, not  around it and keep walking." XXX(2) Normal: Able to step over box with minimal change of gait speed and with good balance. (1) Moderate: Steps over box but touches box OR displays cautious behavior by slowing gait. (0) Severe: Unable to  step over box OR steps around box. 14. TIMED UP & GO WITH DUAL TASK [3 METER WALK] Instruction TUG: "When I say 'Go', stand up from chair, walk at your normal speed across the tape on the floor, turn around, and come back to sit in the chair." Instruction TUG with Dual Task: "Count backwards by threes starting at ___. When I say 'Go', stand up from chair, walk at your normal speed across the tape on the floor, turn around, and come back to sit in the chair. Continue counting backwards the entire time." TUG: ___6.64_____seconds; Dual Task TUG: __7.35______seconds [id number]) Normal: No noticeable change in sitting, standing or walking while backward counting when compared to TUG without Dual Task. (1) Moderate: Dual Task affects either counting OR walking (>10%) when compared to the TUG without Dual Task. (0) Severe: Stops counting while walking OR stops walking while counting. When scoring item 14, if subject's gait speed slows more than 10% between the TUG without and with a Dual Task the score should be decreased by a point. TOTAL SCORE: _____28___/28        Vestibular Assessment - 02/22/16 1230      Occulomotor Exam   Occulomotor Alignment Normal   Spontaneous Absent   Gaze-induced Absent   Head shaking Horizontal Absent   Smooth Pursuits Intact   Saccades Intact     Visual Acuity   Static line 5   Dynamic line 5  no change with head movements  WNL                                   Plan - 02/22/16 2107    Clinical Impression Statement All tests performed today are within normal limts. Berg Balance 56/56, Timed Up & Go 6.64sec & cognitiveTUG 7.35sec, Functional Gait Assessment 30 MiniBESTest 28/28 and vestibular test  WNL. Patient is scheduled to see her PCP tomorrow. She will need physician clearance to return to driving & work. PT testing did not find any impairments limiting driving or work tasks.    PT Frequency One time visit   PT Next Visit Plan Eval only as no deficits found   Consulted and Agree with Plan of Care Patient;Family member/caregiver   Family Member Consulted Husband      Patient will benefit from skilled therapeutic intervention in order to improve the following deficits and impairments:  Other (comment)  Visit Diagnosis: Other symptoms and signs involving the nervous system - Plan: PT plan of care cert/re-cert     Problem List Patient Active Problem List   Diagnosis Date Noted  . Cerebral thrombosis with cerebral infarction 02/18/2016  . Anisocoria 02/17/2016  . Epilepsy ([place]) 02/17/2016  . Essential tremor 02/17/2016  . HLD (hyperlipidemia) 02/17/2016  . Headache 02/17/2016  . Stroke-like symptoms 02/17/2016    Jamey Reas PT, DPT 02/22/2016, 9:20 PM  [place] [address] [place], [place], [address] Phone: [phone]   Fax:  [phone]  Name: ANDRAYA KIZER MRN: [id number] Date of Birth: Jan 17, 1966

## 2016-02-23 DIAGNOSIS — E038 Other specified hypothyroidism: Secondary | ICD-10-CM | POA: Diagnosis not present

## 2016-02-23 DIAGNOSIS — E784 Other hyperlipidemia: Secondary | ICD-10-CM | POA: Diagnosis not present

## 2016-02-23 DIAGNOSIS — R7301 Impaired fasting glucose: Secondary | ICD-10-CM | POA: Diagnosis not present

## 2016-02-23 DIAGNOSIS — R569 Unspecified convulsions: Secondary | ICD-10-CM | POA: Diagnosis not present

## 2016-02-23 DIAGNOSIS — I639 Cerebral infarction, unspecified: Secondary | ICD-10-CM | POA: Diagnosis not present

## 2016-02-23 DIAGNOSIS — I10 Essential (primary) hypertension: Secondary | ICD-10-CM | POA: Diagnosis not present

## 2016-02-23 DIAGNOSIS — Z6827 Body mass index (BMI) 27.0-27.9, adult: Secondary | ICD-10-CM | POA: Diagnosis not present

## 2016-02-23 DIAGNOSIS — Z1389 Encounter for screening for other disorder: Secondary | ICD-10-CM | POA: Diagnosis not present

## 2016-02-25 DIAGNOSIS — R51 Headache: Secondary | ICD-10-CM | POA: Diagnosis not present

## 2016-02-25 DIAGNOSIS — H5704 Mydriasis: Secondary | ICD-10-CM | POA: Diagnosis not present

## 2016-02-25 MED FILL — ZOLPIDEM TARTRATE 10 MG TAB: 10 | 30 days supply | Qty: 30 | Fill #4

## 2016-02-29 ENCOUNTER — Ambulatory Visit (INDEPENDENT_AMBULATORY_CARE_PROVIDER_SITE_OTHER): Payer: 59 | Admitting: Psychology

## 2016-02-29 DIAGNOSIS — F324 Major depressive disorder, single episode, in partial remission: Secondary | ICD-10-CM | POA: Diagnosis not present

## 2016-02-29 NOTE — Progress Notes (Signed)
Late entry for missing G-code   13-Mar-2016 1523  PT G-Codes **NOT FOR INPATIENT CLASS**  Functional Assessment Tool Used clinical judgment  Functional Limitation Mobility: Walking and moving around  Mobility: Walking and Moving Around Current Status [phone]) CI  Mobility: Walking and Moving Around Goal Status [id number]) CI  Cassell Clement, PT, CSCS Pager [phone] Office [phone]

## 2016-03-09 MED FILL — DIVALPROEX SOD ER 500 MG TA: 500 | 90 days supply | Qty: 90 | Fill #0

## 2016-03-10 MED FILL — PRIMIDONE 50 MG TABLET: 50 | 30 days supply | Qty: 150 | Fill #5

## 2016-03-14 ENCOUNTER — Ambulatory Visit: Payer: 59 | Admitting: Psychology

## 2016-03-16 DIAGNOSIS — Z888 Allergy status to other drugs, medicaments and biological substances status: Secondary | ICD-10-CM | POA: Diagnosis not present

## 2016-03-16 DIAGNOSIS — E039 Hypothyroidism, unspecified: Secondary | ICD-10-CM | POA: Diagnosis not present

## 2016-03-16 DIAGNOSIS — Z79899 Other long term (current) drug therapy: Secondary | ICD-10-CM | POA: Diagnosis not present

## 2016-03-16 DIAGNOSIS — H5702 Anisocoria: Secondary | ICD-10-CM | POA: Diagnosis not present

## 2016-03-16 DIAGNOSIS — Z880 Allergy status to penicillin: Secondary | ICD-10-CM | POA: Diagnosis not present

## 2016-03-16 DIAGNOSIS — I10 Essential (primary) hypertension: Secondary | ICD-10-CM | POA: Diagnosis not present

## 2016-03-20 MED FILL — LEVETIRACETAM ER 500 MG TAB: 500 | 90 days supply | Qty: 450 | Fill #2

## 2016-03-20 MED FILL — lamoTRIgine 100 MG TABS: 100 | 90 days supply | Qty: 180 | Fill #0

## 2016-03-20 MED FILL — SERTRALINE HCL 50 MG TABLET: 50 | 90 days supply | Qty: 180 | Fill #3

## 2016-03-28 DIAGNOSIS — E039 Hypothyroidism, unspecified: Secondary | ICD-10-CM | POA: Diagnosis not present

## 2016-03-28 DIAGNOSIS — I10 Essential (primary) hypertension: Secondary | ICD-10-CM | POA: Diagnosis not present

## 2016-03-28 DIAGNOSIS — Z969 Presence of functional implant, unspecified: Secondary | ICD-10-CM | POA: Diagnosis not present

## 2016-03-28 DIAGNOSIS — Z9689 Presence of other specified functional implants: Secondary | ICD-10-CM | POA: Diagnosis not present

## 2016-03-28 DIAGNOSIS — Z7982 Long term (current) use of aspirin: Secondary | ICD-10-CM | POA: Diagnosis not present

## 2016-03-28 DIAGNOSIS — Z888 Allergy status to other drugs, medicaments and biological substances status: Secondary | ICD-10-CM | POA: Diagnosis not present

## 2016-03-28 DIAGNOSIS — Z880 Allergy status to penicillin: Secondary | ICD-10-CM | POA: Diagnosis not present

## 2016-03-28 DIAGNOSIS — G250 Essential tremor: Secondary | ICD-10-CM | POA: Diagnosis not present

## 2016-03-28 DIAGNOSIS — Z79899 Other long term (current) drug therapy: Secondary | ICD-10-CM | POA: Diagnosis not present

## 2016-03-29 MED FILL — ZOLPIDEM TARTRATE 10 MG TAB: 10 | 30 days supply | Qty: 30 | Fill #5

## 2016-03-30 ENCOUNTER — Ambulatory Visit (INDEPENDENT_AMBULATORY_CARE_PROVIDER_SITE_OTHER): Payer: 59 | Admitting: Psychology

## 2016-03-30 DIAGNOSIS — F324 Major depressive disorder, single episode, in partial remission: Secondary | ICD-10-CM | POA: Diagnosis not present

## 2016-04-05 DIAGNOSIS — G902 Horner's syndrome: Secondary | ICD-10-CM | POA: Diagnosis not present

## 2016-04-05 DIAGNOSIS — G40311 Generalized idiopathic epilepsy and epileptic syndromes, intractable, with status epilepticus: Secondary | ICD-10-CM | POA: Diagnosis not present

## 2016-04-05 DIAGNOSIS — G250 Essential tremor: Secondary | ICD-10-CM | POA: Diagnosis not present

## 2016-04-11 ENCOUNTER — Ambulatory Visit (INDEPENDENT_AMBULATORY_CARE_PROVIDER_SITE_OTHER): Payer: 59 | Admitting: Psychology

## 2016-04-11 DIAGNOSIS — F324 Major depressive disorder, single episode, in partial remission: Secondary | ICD-10-CM | POA: Diagnosis not present

## 2016-04-17 MED FILL — PRIMIDONE 50 MG TABLET: 50 | 30 days supply | Qty: 150 | Fill #0

## 2016-04-26 MED FILL — ZOLPIDEM TARTRATE 10 MG TAB: 10 | 30 days supply | Qty: 30 | Fill #0

## 2016-04-27 MED FILL — ATORVASTATIN 40 MG TABLET: 40 | 90 days supply | Qty: 90 | Fill #0

## 2016-05-01 ENCOUNTER — Ambulatory Visit (INDEPENDENT_AMBULATORY_CARE_PROVIDER_SITE_OTHER): Payer: 59 | Admitting: Psychology

## 2016-05-01 DIAGNOSIS — F324 Major depressive disorder, single episode, in partial remission: Secondary | ICD-10-CM

## 2016-05-02 MED FILL — SYNTHROID 88 MCG TABLET: 88 | 90 days supply | Qty: 90 | Fill #2

## 2016-05-09 ENCOUNTER — Ambulatory Visit (INDEPENDENT_AMBULATORY_CARE_PROVIDER_SITE_OTHER): Payer: 59 | Admitting: Psychology

## 2016-05-09 DIAGNOSIS — F324 Major depressive disorder, single episode, in partial remission: Secondary | ICD-10-CM | POA: Diagnosis not present

## 2016-05-15 MED FILL — clonazePAM 0.5 MG TABS: 0.5 | 30 days supply | Qty: 60 | Fill #0

## 2016-05-18 MED FILL — PRIMIDONE 50 MG TABLET: 50 | 30 days supply | Qty: 150 | Fill #1

## 2016-05-23 ENCOUNTER — Ambulatory Visit (INDEPENDENT_AMBULATORY_CARE_PROVIDER_SITE_OTHER): Payer: 59 | Admitting: Psychology

## 2016-05-23 DIAGNOSIS — F324 Major depressive disorder, single episode, in partial remission: Secondary | ICD-10-CM | POA: Diagnosis not present

## 2016-05-26 MED FILL — ZOLPIDEM TARTRATE 10 MG TAB: 10 | 30 days supply | Qty: 30 | Fill #1

## 2016-06-06 ENCOUNTER — Ambulatory Visit: Payer: Self-pay | Admitting: Psychology

## 2016-06-08 MED FILL — DIVALPROEX SOD ER 500 MG TA: 500 | 90 days supply | Qty: 90 | Fill #1

## 2016-06-15 MED FILL — PRIMIDONE 50 MG TABLET: 50 | 30 days supply | Qty: 150 | Fill #2

## 2016-06-20 ENCOUNTER — Ambulatory Visit: Payer: Self-pay | Admitting: Psychology

## 2016-06-21 MED FILL — lamoTRIgine 100 MG TABS: 100 | 90 days supply | Qty: 180 | Fill #1

## 2016-06-22 MED FILL — LEVETIRACETAM ER 500 MG TAB: 500 | 90 days supply | Qty: 450 | Fill #0

## 2016-06-22 MED FILL — SERTRALINE HCL 50 MG TABLET: 50 | 90 days supply | Qty: 180 | Fill #0

## 2016-06-23 DIAGNOSIS — H5702 Anisocoria: Secondary | ICD-10-CM | POA: Diagnosis not present

## 2016-06-23 MED FILL — ZOLPIDEM TARTRATE 10 MG TAB: 10 | 30 days supply | Qty: 30 | Fill #2

## 2016-07-04 ENCOUNTER — Ambulatory Visit (INDEPENDENT_AMBULATORY_CARE_PROVIDER_SITE_OTHER): Payer: 59 | Admitting: Psychology

## 2016-07-04 DIAGNOSIS — F324 Major depressive disorder, single episode, in partial remission: Secondary | ICD-10-CM | POA: Diagnosis not present

## 2016-07-12 MED FILL — PRIMIDONE 50 MG TABLET: 50 | 30 days supply | Qty: 150 | Fill #3

## 2016-07-14 DIAGNOSIS — E038 Other specified hypothyroidism: Secondary | ICD-10-CM | POA: Diagnosis not present

## 2016-07-14 DIAGNOSIS — E784 Other hyperlipidemia: Secondary | ICD-10-CM | POA: Diagnosis not present

## 2016-07-18 ENCOUNTER — Ambulatory Visit: Payer: 59 | Admitting: Psychology

## 2016-07-18 DIAGNOSIS — M859 Disorder of bone density and structure, unspecified: Secondary | ICD-10-CM | POA: Diagnosis not present

## 2016-07-18 DIAGNOSIS — I10 Essential (primary) hypertension: Secondary | ICD-10-CM | POA: Diagnosis not present

## 2016-07-18 DIAGNOSIS — R7301 Impaired fasting glucose: Secondary | ICD-10-CM | POA: Diagnosis not present

## 2016-07-18 DIAGNOSIS — R569 Unspecified convulsions: Secondary | ICD-10-CM | POA: Diagnosis not present

## 2016-07-18 DIAGNOSIS — G250 Essential tremor: Secondary | ICD-10-CM | POA: Diagnosis not present

## 2016-07-18 DIAGNOSIS — G902 Horner's syndrome: Secondary | ICD-10-CM | POA: Diagnosis not present

## 2016-07-18 DIAGNOSIS — E784 Other hyperlipidemia: Secondary | ICD-10-CM | POA: Diagnosis not present

## 2016-07-18 DIAGNOSIS — K589 Irritable bowel syndrome without diarrhea: Secondary | ICD-10-CM | POA: Diagnosis not present

## 2016-07-18 DIAGNOSIS — E038 Other specified hypothyroidism: Secondary | ICD-10-CM | POA: Diagnosis not present

## 2016-07-25 MED FILL — ATORVASTATIN 40 MG TABLET: 40 | 90 days supply | Qty: 90 | Fill #1

## 2016-07-25 MED FILL — ZOLPIDEM TARTRATE 10 MG TAB: 10 | 30 days supply | Qty: 30 | Fill #3

## 2016-08-01 ENCOUNTER — Ambulatory Visit (INDEPENDENT_AMBULATORY_CARE_PROVIDER_SITE_OTHER): Payer: 59 | Admitting: Psychology

## 2016-08-01 DIAGNOSIS — F324 Major depressive disorder, single episode, in partial remission: Secondary | ICD-10-CM | POA: Diagnosis not present

## 2016-08-10 MED FILL — SYNTHROID 88 MCG TABLET: 88 | 90 days supply | Qty: 90 | Fill #0

## 2016-08-10 MED FILL — PRIMIDONE 50 MG TABLET: 50 | 30 days supply | Qty: 150 | Fill #4

## 2016-08-15 ENCOUNTER — Ambulatory Visit (INDEPENDENT_AMBULATORY_CARE_PROVIDER_SITE_OTHER): Payer: 59 | Admitting: Psychology

## 2016-08-15 DIAGNOSIS — F324 Major depressive disorder, single episode, in partial remission: Secondary | ICD-10-CM | POA: Diagnosis not present

## 2016-08-22 DIAGNOSIS — Z0100 Encounter for examination of eyes and vision without abnormal findings: Secondary | ICD-10-CM | POA: Diagnosis not present

## 2016-08-24 MED FILL — ZOLPIDEM TARTRATE 10 MG TAB: 10 | 30 days supply | Qty: 30 | Fill #4

## 2016-08-29 ENCOUNTER — Ambulatory Visit (INDEPENDENT_AMBULATORY_CARE_PROVIDER_SITE_OTHER): Payer: 59 | Admitting: Psychology

## 2016-08-29 DIAGNOSIS — F324 Major depressive disorder, single episode, in partial remission: Secondary | ICD-10-CM

## 2016-09-05 MED FILL — clonazePAM 0.5 MG TABS: 0.5 | 30 days supply | Qty: 60 | Fill #1

## 2016-09-05 MED FILL — DIVALPROEX SOD ER 500 MG TA: 500 | 90 days supply | Qty: 90 | Fill #2

## 2016-09-11 MED FILL — PRIMIDONE 50 MG TABLET: 50 | 30 days supply | Qty: 150 | Fill #5

## 2016-09-12 ENCOUNTER — Ambulatory Visit (INDEPENDENT_AMBULATORY_CARE_PROVIDER_SITE_OTHER): Payer: 59 | Admitting: Psychology

## 2016-09-12 DIAGNOSIS — F324 Major depressive disorder, single episode, in partial remission: Secondary | ICD-10-CM | POA: Diagnosis not present

## 2016-09-14 ENCOUNTER — Other Ambulatory Visit: Payer: Self-pay | Admitting: Endocrinology

## 2016-09-14 DIAGNOSIS — Z1231 Encounter for screening mammogram for malignant neoplasm of breast: Secondary | ICD-10-CM

## 2016-09-22 MED FILL — ZOLPIDEM TARTRATE 10 MG TAB: 10 | 30 days supply | Qty: 30 | Fill #5

## 2016-09-22 MED FILL — lamoTRIgine 100 MG TABS: 100 | 90 days supply | Qty: 180 | Fill #2

## 2016-09-26 ENCOUNTER — Ambulatory Visit (INDEPENDENT_AMBULATORY_CARE_PROVIDER_SITE_OTHER): Payer: 59 | Admitting: Psychology

## 2016-09-26 DIAGNOSIS — F324 Major depressive disorder, single episode, in partial remission: Secondary | ICD-10-CM

## 2016-09-27 ENCOUNTER — Ambulatory Visit
Admission: RE | Admit: 2016-09-27 | Discharge: 2016-09-27 | Disposition: A | Discharge status: Home / Self Care | Payer: 59 | Source: Ambulatory Visit | Attending: Endocrinology | Admitting: Endocrinology

## 2016-09-27 DIAGNOSIS — Z1231 Encounter for screening mammogram for malignant neoplasm of breast: Secondary | ICD-10-CM

## 2016-09-29 MED FILL — SERTRALINE HCL 50 MG TABLET: 50 | 90 days supply | Qty: 180 | Fill #1

## 2016-09-29 MED FILL — LEVETIRACETAM ER 500 MG TAB: 500 | 90 days supply | Qty: 450 | Fill #1

## 2016-10-05 MED FILL — PRIMIDONE 50 MG TABLET: 50 | 30 days supply | Qty: 150 | Fill #0

## 2016-10-10 ENCOUNTER — Ambulatory Visit (INDEPENDENT_AMBULATORY_CARE_PROVIDER_SITE_OTHER): Payer: 59 | Admitting: Psychology

## 2016-10-10 DIAGNOSIS — F324 Major depressive disorder, single episode, in partial remission: Secondary | ICD-10-CM | POA: Diagnosis not present

## 2016-10-18 DIAGNOSIS — G250 Essential tremor: Secondary | ICD-10-CM | POA: Diagnosis not present

## 2016-10-18 DIAGNOSIS — E785 Hyperlipidemia, unspecified: Secondary | ICD-10-CM | POA: Diagnosis not present

## 2016-10-18 DIAGNOSIS — G40309 Generalized idiopathic epilepsy and epileptic syndromes, not intractable, without status epilepticus: Secondary | ICD-10-CM | POA: Diagnosis not present

## 2016-10-18 DIAGNOSIS — E039 Hypothyroidism, unspecified: Secondary | ICD-10-CM | POA: Diagnosis not present

## 2016-10-18 DIAGNOSIS — Z7982 Long term (current) use of aspirin: Secondary | ICD-10-CM | POA: Diagnosis not present

## 2016-10-18 DIAGNOSIS — Z9689 Presence of other specified functional implants: Secondary | ICD-10-CM | POA: Diagnosis not present

## 2016-10-18 DIAGNOSIS — Z452 Encounter for adjustment and management of vascular access device: Secondary | ICD-10-CM | POA: Diagnosis not present

## 2016-10-18 DIAGNOSIS — Z4542 Encounter for adjustment and management of neuropacemaker (brain) (peripheral nerve) (spinal cord): Secondary | ICD-10-CM | POA: Diagnosis not present

## 2016-10-18 DIAGNOSIS — G20 Parkinson's disease: Secondary | ICD-10-CM | POA: Diagnosis not present

## 2016-10-18 MED FILL — HYDROCODON-APAP 5-325: 5-325 | 2 days supply | Qty: 10 | Fill #0

## 2016-10-23 MED FILL — ATORVASTATIN 40 MG TABLET: 40 | 90 days supply | Qty: 90 | Fill #0

## 2016-10-24 ENCOUNTER — Ambulatory Visit (INDEPENDENT_AMBULATORY_CARE_PROVIDER_SITE_OTHER): Payer: 59 | Admitting: Psychology

## 2016-10-24 DIAGNOSIS — F324 Major depressive disorder, single episode, in partial remission: Secondary | ICD-10-CM

## 2016-10-25 MED FILL — ZOLPIDEM TARTRATE 10 MG TAB: 10 | 30 days supply | Qty: 30 | Fill #0

## 2016-10-31 DIAGNOSIS — E039 Hypothyroidism, unspecified: Secondary | ICD-10-CM | POA: Diagnosis not present

## 2016-10-31 DIAGNOSIS — Z79890 Hormone replacement therapy (postmenopausal): Secondary | ICD-10-CM | POA: Diagnosis not present

## 2016-10-31 DIAGNOSIS — Z888 Allergy status to other drugs, medicaments and biological substances status: Secondary | ICD-10-CM | POA: Diagnosis not present

## 2016-10-31 DIAGNOSIS — Z880 Allergy status to penicillin: Secondary | ICD-10-CM | POA: Diagnosis not present

## 2016-10-31 DIAGNOSIS — Z9689 Presence of other specified functional implants: Secondary | ICD-10-CM | POA: Diagnosis not present

## 2016-10-31 DIAGNOSIS — G250 Essential tremor: Secondary | ICD-10-CM | POA: Diagnosis not present

## 2016-10-31 DIAGNOSIS — Z7982 Long term (current) use of aspirin: Secondary | ICD-10-CM | POA: Diagnosis not present

## 2016-10-31 DIAGNOSIS — I10 Essential (primary) hypertension: Secondary | ICD-10-CM | POA: Diagnosis not present

## 2016-10-31 MED FILL — CEPHALEXIN 500 MG CAPSULE: 500 | 10 days supply | Qty: 20 | Fill #0

## 2016-11-04 DIAGNOSIS — M791 Myalgia, unspecified site: Secondary | ICD-10-CM | POA: Diagnosis not present

## 2016-11-04 DIAGNOSIS — Y848 Other medical procedures as the cause of abnormal reaction of the patient, or of later complication, without mention of misadventure at the time of the procedure: Secondary | ICD-10-CM | POA: Diagnosis not present

## 2016-11-04 DIAGNOSIS — G9731 Intraoperative hemorrhage and hematoma of a nervous system organ or structure complicating a nervous system procedure: Secondary | ICD-10-CM | POA: Diagnosis not present

## 2016-11-04 DIAGNOSIS — R509 Fever, unspecified: Secondary | ICD-10-CM | POA: Diagnosis not present

## 2016-11-06 MED FILL — PRIMIDONE 50 MG TABLET: 50 | 30 days supply | Qty: 150 | Fill #1

## 2016-11-07 ENCOUNTER — Ambulatory Visit (INDEPENDENT_AMBULATORY_CARE_PROVIDER_SITE_OTHER): Payer: 59 | Admitting: Psychology

## 2016-11-07 DIAGNOSIS — F324 Major depressive disorder, single episode, in partial remission: Secondary | ICD-10-CM | POA: Diagnosis not present

## 2016-11-16 MED FILL — SYNTHROID 88 MCG TABLET: 88 | 90 days supply | Qty: 90 | Fill #1

## 2016-11-23 MED FILL — ZOLPIDEM TARTRATE 10 MG TAB: 10 | 30 days supply | Qty: 30 | Fill #1

## 2016-11-28 MED FILL — DIVALPROEX SOD ER 500 MG TA: 500 | 90 days supply | Qty: 90 | Fill #3

## 2016-12-05 ENCOUNTER — Ambulatory Visit (INDEPENDENT_AMBULATORY_CARE_PROVIDER_SITE_OTHER): Payer: 59 | Admitting: Psychology

## 2016-12-05 DIAGNOSIS — F324 Major depressive disorder, single episode, in partial remission: Secondary | ICD-10-CM

## 2016-12-07 MED FILL — PRIMIDONE 50 MG TABLET: 50 | 30 days supply | Qty: 150 | Fill #2

## 2016-12-18 DIAGNOSIS — G40309 Generalized idiopathic epilepsy and epileptic syndromes, not intractable, without status epilepticus: Secondary | ICD-10-CM | POA: Diagnosis not present

## 2016-12-18 DIAGNOSIS — Z79899 Other long term (current) drug therapy: Secondary | ICD-10-CM | POA: Diagnosis not present

## 2016-12-18 MED FILL — lamoTRIgine 100 MG TABS: 100 | 90 days supply | Qty: 180 | Fill #0

## 2016-12-18 MED FILL — clonazePAM 0.5 MG TABS: 0.5 | 30 days supply | Qty: 60 | Fill #0

## 2016-12-19 ENCOUNTER — Ambulatory Visit: Payer: 59 | Admitting: Psychology

## 2016-12-20 ENCOUNTER — Encounter: Payer: Self-pay | Admitting: Neurology

## 2016-12-25 MED FILL — SERTRALINE HCL 50 MG TABLET: 50 | 90 days supply | Qty: 180 | Fill #2

## 2016-12-25 MED FILL — ZOLPIDEM TARTRATE 10 MG TAB: 10 | 30 days supply | Qty: 30 | Fill #2

## 2017-01-02 MED FILL — LEVETIRACETAM ER 500 MG TAB: 500 | 90 days supply | Qty: 450 | Fill #2

## 2017-01-04 MED FILL — PRIMIDONE 50 MG TABLET: 50 | 30 days supply | Qty: 150 | Fill #3

## 2017-01-08 DIAGNOSIS — I10 Essential (primary) hypertension: Secondary | ICD-10-CM | POA: Diagnosis not present

## 2017-01-08 DIAGNOSIS — E7849 Other hyperlipidemia: Secondary | ICD-10-CM | POA: Diagnosis not present

## 2017-01-08 DIAGNOSIS — E038 Other specified hypothyroidism: Secondary | ICD-10-CM | POA: Diagnosis not present

## 2017-01-08 DIAGNOSIS — R7301 Impaired fasting glucose: Secondary | ICD-10-CM | POA: Diagnosis not present

## 2017-01-08 DIAGNOSIS — M859 Disorder of bone density and structure, unspecified: Secondary | ICD-10-CM | POA: Diagnosis not present

## 2017-01-08 DIAGNOSIS — R82998 Other abnormal findings in urine: Secondary | ICD-10-CM | POA: Diagnosis not present

## 2017-01-09 DIAGNOSIS — I10 Essential (primary) hypertension: Secondary | ICD-10-CM | POA: Diagnosis not present

## 2017-01-09 DIAGNOSIS — G250 Essential tremor: Secondary | ICD-10-CM | POA: Diagnosis not present

## 2017-01-09 DIAGNOSIS — R569 Unspecified convulsions: Secondary | ICD-10-CM | POA: Diagnosis not present

## 2017-01-09 DIAGNOSIS — K589 Irritable bowel syndrome without diarrhea: Secondary | ICD-10-CM | POA: Diagnosis not present

## 2017-01-09 DIAGNOSIS — Z8673 Personal history of transient ischemic attack (TIA), and cerebral infarction without residual deficits: Secondary | ICD-10-CM | POA: Diagnosis not present

## 2017-01-09 DIAGNOSIS — R7301 Impaired fasting glucose: Secondary | ICD-10-CM | POA: Diagnosis not present

## 2017-01-09 DIAGNOSIS — Z0000 Encounter for general adult medical examination without abnormal findings: Secondary | ICD-10-CM | POA: Diagnosis not present

## 2017-01-09 DIAGNOSIS — G902 Horner's syndrome: Secondary | ICD-10-CM | POA: Diagnosis not present

## 2017-01-09 DIAGNOSIS — E7849 Other hyperlipidemia: Secondary | ICD-10-CM | POA: Diagnosis not present

## 2017-01-10 MED FILL — VIT D2 1.25 MG (50,000 UNIT: 1.25 MG | 84 days supply | Qty: 12 | Fill #0

## 2017-01-10 MED FILL — EZETIMIBE 10 MG TABS: 10 | 90 days supply | Qty: 90 | Fill #0

## 2017-01-12 MED FILL — ATORVASTATIN 40 MG TABLET: 40 | 90 days supply | Qty: 90 | Fill #1

## 2017-01-17 MED FILL — LEVETIRACETAM ER 500 MG TAB: 500 | 12 days supply | Qty: 60 | Fill #3

## 2017-01-19 MED FILL — ZOLPIDEM TARTRATE 10 MG TAB: 10 | 30 days supply | Qty: 30 | Fill #3

## 2017-01-29 ENCOUNTER — Encounter: Payer: Self-pay | Admitting: Neurology

## 2017-01-29 ENCOUNTER — Ambulatory Visit: Payer: 59 | Admitting: Neurology

## 2017-01-29 VITALS — BP 138/76 | HR 71 | Ht 62.0 in | Wt 155.0 lb

## 2017-01-29 DIAGNOSIS — G40309 Generalized idiopathic epilepsy and epileptic syndromes, not intractable, without status epilepticus: Secondary | ICD-10-CM | POA: Diagnosis not present

## 2017-01-29 NOTE — Progress Notes (Signed)
NEUROLOGY CONSULTATION NOTE  Alyssa Mccarthy MRN: [id number] DOB: [birth year]  Referring provider: Dr. Neena Rhymes Primary care provider: Dr. [name]  Reason for consult:  Establish care for epilepsy  Dear Dr [name]:  Thank you for your kind referral of Alyssa Mccarthy for consultation of the above symptoms. Although her history is well known to you, please allow me to reiterate it for the purpose of our medical record. Records and images were personally reviewed where available.   HISTORY OF PRESENT ILLNESS: This is a pleasant 52 year old right-handed woman with a history of idiopathic generalized epilepsy, essential tremor s/p VIM DBS, presenting to establish care. She had been seeing epileptologist Dr. [name] at [facility], records were reviewed. She sees Movement Disorder specialist Dr. [name] for the essential tremor. Seizures started around age 44. She reports seizures were well-controlled for 25 years, then after she had her daughter, she was having more seizures for 3-4 years, then overall has been well-controlled the past 10 years. She has no clear aura, describes body jerking that progresses to a generalized convulsion. Flashing lights and sleep deprivation are seizure triggers. She has not had any seizures since around 2015 when Depakote was re-added to her regimen. She is currently on Lamictal 100mg  BID, Keppra XR 2500mg  daily, and Depakote ER 500mg  daily with no side effects. She had been on Depakote for many years in the past but had weight gain, it was stopped, then restarted around 4 years ago. She is also on Primidone for the tremor. She denies any olfactory/gustatory hallucinations, deja vu, rising epigastric sensation, focal numbness/tingling/weakness, myoclonic jerks. She rarely has a funny feeling that "something is not quite right" and would take a prn clonazepam as a precaution, she has not needed this in about a year.   She denies any headaches, dizziness, diplopia,  dysarthria/dysphagia, neck/back pain, bowel/bladder dysfunction. Sleep is good. She had been taking Zoloft increased dose 4 years ago due to irritability, improved with higher dose. She was diagnosed with a stroke in January 2018 when she had a bad headache and anisocoria. MRI brain showed subtle T2 hyperintensity in the pons, R>L. She saw Dr. 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 She has chronic mild right-sided sensory changes since then. She has also had symptoms every once in a while where there is something she can't see (not clearly on peripheral vision), she blinks and vision is back to normal. She has chronic numbness on the left elbow region. She has noticed she has no interest in eating, but makes herself eat. 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   Diagnostic Data: MRI brain in March 2011 reported as normal. EEG in March 2010 abnormal due to generalized polyspike and wave, consistent with a primary generalized epilepsy Prior AEDs: Phenobarbital, Zonisamide, Dilantin. On a different generic Levetiracetam, she had dry hands and peeling in fingertips, which resolved after switching to a different generic  Epilepsy Risk Factors:  There is a strong family history of seizures in her paternal grandfather, father, and daughter. Otherwise she had a normal birth and early development.  There is no history of febrile convulsions, CNS infections such as meningitis/encephalitis, significant traumatic brain injury, neurosurgical procedures.   PAST MEDICAL HISTORY: Past Medical History:  Diagnosis Date  . Essential tremor   . Hyperlipidemia   . Seizure disorder ([place])     PAST SURGICAL HISTORY: Past Surgical History:  Procedure Laterality Date  . ABDOMINAL HYSTERECTOMY    .  APPENDECTOMY    . BRAIN SURGERY    . CARPAL TUNNEL RELEASE    . TOTAL KNEE ARTHROPLASTY      MEDICATIONS: Current Outpatient Medications on File Prior to Visit  Medication Sig Dispense  Refill  . aspirin EC 325 MG EC tablet Take 1 tablet (325 mg total) by mouth daily. 30 tablet 1  . atorvastatin (LIPITOR) 40 MG tablet Take 1 tablet (40 mg total) by mouth every evening. 30 tablet 1  . clonazePAM (KLONOPIN) 0.5 MG tablet Take 0.5-1 mg by mouth daily as needed for seizure.    . dicyclomine (BENTYL) 10 MG capsule Take 10 mg by mouth daily as needed for spasms.    . divalproex (DEPAKOTE ER) 500 MG 24 hr tablet Take 500 mg by mouth at bedtime.    Marland Kitchen ezetimibe (ZETIA) 10 MG tablet     . HYDROcodone-acetaminophen (NORCO/VICODIN) 5-325 MG tablet Take 1 tablet by mouth every 6 (six) hours as needed (headache). 20 tablet 0  . ibuprofen (ADVIL,MOTRIN) 200 MG tablet Take 400 mg by mouth every 6 (six) hours as needed for headache or moderate pain.    Marland Kitchen lamoTRIgine (LAMICTAL) 100 MG tablet Take 100 mg by mouth 2 (two) times daily.     Marland Kitchen levETIRAcetam (KEPPRA XR) 500 MG 24 hr tablet Take 2,500 mg by mouth daily.    Marland Kitchen levothyroxine (SYNTHROID, LEVOTHROID) 88 MCG tablet Take 88 mcg by mouth daily before breakfast.    . naproxen sodium (ANAPROX) 220 MG tablet Take 220 mg by mouth 2 (two) times daily as needed (pain).    . primidone (MYSOLINE) 50 MG tablet Take 100-150 mg by mouth See admin instructions. Takes 2 tabs in am and 3 tabs in pm    . sertraline (ZOLOFT) 50 MG tablet Take 100 mg by mouth daily.    . Soft Lens Products (REWETTING DROPS) SOLN Apply 1 drop to eye 3 (three) times daily as needed (for contacts).    Marland Kitchen UNABLE TO FIND Outpatient PT 1 each 0  . Vitamin D, Ergocalciferol, (DRISDOL) 50000 units CAPS capsule Take 50,000 Units by mouth every Sunday.    . zolpidem (AMBIEN) 10 MG tablet Take 10 mg by mouth at bedtime.      No current facility-administered medications on file prior to visit.     ALLERGIES: Allergies  Allergen Reactions  . Gluten Meal Diarrhea    Severe diarrhea  . Other Other (See Comments)    Bleach-respiratory distress  . Plavix [Clopidogrel Bisulfate] Itching  and Rash    Hands and feet   . Penicillins Itching, Rash and Other (See Comments)    Childhood allergy- specifics unknown; tolerated amoxicillin and ancef since then     FAMILY HISTORY: Family History  Problem Relation Age of Onset  . Heart attack Father 50  . Stroke Neg Hx   . Breast cancer Neg Hx     SOCIAL HISTORY: Social History   Socioeconomic History  . Marital status: Married    Spouse name: Not on file  . Number of children: Not on file  . Years of education: Not on file  . Highest education level: Not on file  Social Needs  . Financial resource strain: Not on file  . Food insecurity - worry: Not on file  . Food insecurity - inability: Not on file  . Transportation needs - medical: Not on file  . Transportation needs - non-medical: Not on file  Occupational History  . Not on file  Tobacco  Use  . Smoking status: Never Smoker  . Smokeless tobacco: Never Used  Substance and Sexual Activity  . Alcohol use: No  . Drug use: No  . Sexual activity: Not on file  Other Topics Concern  . Not on file  Social History Narrative  . Not on file    REVIEW OF SYSTEMS: Constitutional: No fevers, chills, or sweats, no generalized fatigue, change in appetite Eyes: No visual changes, double vision, eye pain Ear, nose and throat: No hearing loss, ear pain, nasal congestion, sore throat Cardiovascular: No chest pain, palpitations Respiratory:  No shortness of breath at rest or with exertion, wheezes GastrointestinaI: No nausea, vomiting, diarrhea, abdominal pain, fecal incontinence Genitourinary:  No dysuria, urinary retention or frequency Musculoskeletal:  No neck pain, back pain Integumentary: No rash, pruritus, skin lesions Neurological: as above Psychiatric: No depression, insomnia, anxiety Endocrine: No palpitations, fatigue, diaphoresis, mood swings, change in appetite, change in weight, increased thirst Hematologic/Lymphatic:  No anemia, purpura,  petechiae. Allergic/Immunologic: no itchy/runny eyes, nasal congestion, recent allergic reactions, rashes  PHYSICAL EXAM: Vitals:   01/29/17 1017  BP: 138/76  Pulse: 71  SpO2: 97%   General: No acute distress Head:  Normocephalic/atraumatic Eyes: Fundoscopic exam shows bilateral sharp discs, no vessel changes, exudates, or hemorrhages Neck: supple, no paraspinal tenderness, full range of motion Back: No paraspinal tenderness Heart: regular rate and rhythm Lungs: Clear to auscultation bilaterally. Vascular: No carotid bruits. Skin/Extremities: No rash, no edema Neurological Exam: Mental status: alert and oriented to person, place, and time, no dysarthria or aphasia, Fund of knowledge is appropriate.  Recent and remote memory are intact. 3/3 delayed recall.  Attention and concentration are normal.    Able to name objects and repeat phrases. Cranial nerves: CN I: not tested CN II: pupils asymmetric, 2.8mm OS 74mm OD, reactive to light, visual fields intact, fundi unremarkable. CN III, IV, VI:  full range of motion, no nystagmus, no ptosis CN V: decreased cold and pin on right V2 CN VII: shallow left NLF but symmetric smile CN VIII: hearing intact to finger rub CN IX, X: gag intact, uvula midline CN XI: sternocleidomastoid and trapezius muscles intact CN XII: tongue midline Bulk & Tone: normal, no fasciculations. Motor: 5/5 throughout with no pronator drift. Sensation: decreased cold on right UE, decreased pin on right UE and LE, intact vibration and joint position sense. Romberg test negative Deep Tendon Reflexes: +2 throughout, no ankle clonus Plantar responses: downgoing bilaterally Cerebellar: no incoordination on finger to nose testing Gait: narrow-based and steady, able to tandem walk adequately. 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 She has been seizure-free since 2015 with re-addition of low dose Depakote ER 500mg  daily. She is also on Keppra XR 2500mg  daily and Lamictal 100mg  BID with no side effects. She is taking Primidone for the tremor. We had an extensive discussion about the option for streamlining medications/reducing dose (in the past, reduction of Keppra had been discussed), we discussed the risk for breakthrough seizure with any medication adjustment, long-term prognosis, including driving restrictions, and agreed to hold off on any medication changes at this time. Our office will take over refills. She is aware of [place] driving laws to stop driving after a seizure until 6 months seizure-free. She will follow-up in 6  months and knows to call for any changes.   Thank you for allowing me to participate in the care of this patient. Please do not hesitate to call for any questions or concerns.   Ellouise Newer, M.D.  CC: Dr. Forde Dandy

## 2017-01-29 NOTE — Patient Instructions (Signed)
Great meeting you! Continue all your medications. Follow-up in 6 months, call for any changes.  Seizure Precautions: 1. If medication has been prescribed for you to prevent seizures, take it exactly as directed.  Do not stop taking the medicine without talking to your doctor first, even if you have not had a seizure in a long time.   2. Avoid activities in which a seizure would cause danger to yourself or to others.  Don't operate dangerous machinery, swim alone, or climb in high or dangerous places, such as on ladders, roofs, or girders.  Do not drive unless your doctor says you may.  3. If you have any warning that you may have a seizure, lay down in a safe place where you can't hurt yourself.    4.  No driving for 6 months from last seizure, as per [facility].   Please refer to the following link on the [place] website for more information: [email or web address]   5.  Maintain good sleep hygiene. Avoid alcohol.  6.  Contact your doctor if you have any problems that may be related to the medicine you are taking.  7.  Call 911 and bring the patient back to the ED if:        A.  The seizure lasts longer than 5 minutes.       B.  The patient doesn't awaken shortly after the seizure  C.  The patient has new problems such as difficulty seeing, speaking or moving  D.  The patient was injured during the seizure  E.  The patient has a temperature over 102 F (39C)  F.  The patient vomited and now is having trouble breathing

## 2017-02-06 DIAGNOSIS — M7711 Lateral epicondylitis, right elbow: Secondary | ICD-10-CM | POA: Diagnosis not present

## 2017-02-06 DIAGNOSIS — M25521 Pain in right elbow: Secondary | ICD-10-CM | POA: Diagnosis not present

## 2017-02-09 MED FILL — PRIMIDONE 50 MG TABLET: 50 | 30 days supply | Qty: 150 | Fill #4

## 2017-02-14 MED FILL — DIVALPROEX SOD ER 500 MG TA: 500 | 90 days supply | Qty: 90 | Fill #0

## 2017-02-16 MED FILL — ZOLPIDEM TARTRATE 10 MG TAB: 10 | 30 days supply | Qty: 30 | Fill #4

## 2017-02-27 MED FILL — SYNTHROID 88 MCG TABLET: 88 | 90 days supply | Qty: 90 | Fill #2

## 2017-02-28 DIAGNOSIS — J111 Influenza due to unidentified influenza virus with other respiratory manifestations: Secondary | ICD-10-CM | POA: Diagnosis not present

## 2017-02-28 DIAGNOSIS — Z6828 Body mass index (BMI) 28.0-28.9, adult: Secondary | ICD-10-CM | POA: Diagnosis not present

## 2017-02-28 DIAGNOSIS — J019 Acute sinusitis, unspecified: Secondary | ICD-10-CM | POA: Diagnosis not present

## 2017-02-28 DIAGNOSIS — R05 Cough: Secondary | ICD-10-CM | POA: Diagnosis not present

## 2017-02-28 MED FILL — AZITHROMYCIN 250 MG TAB: 250 | 5 days supply | Qty: 6 | Fill #0

## 2017-02-28 MED FILL — ONDANSETRON ODT 8 MG TABLET: 8 | 10 days supply | Qty: 20 | Fill #0

## 2017-02-28 MED FILL — FLUTICASONE PROP 50 MCG SPR: 50 | 30 days supply | Qty: 16 | Fill #0

## 2017-02-28 MED FILL — OSELTAMIVIR PHOSPHATE 75 MG: 75 | 5 days supply | Qty: 10 | Fill #0

## 2017-02-28 MED FILL — predniSONE 5 MG (21) TBPK: 5 | 6 days supply | Qty: 21 | Fill #0

## 2017-03-09 MED FILL — PRIMIDONE 50 MG TABLET: 50 | 30 days supply | Qty: 150 | Fill #5

## 2017-03-26 MED FILL — ZOLPIDEM TARTRATE 10 MG TAB: 10 | 30 days supply | Qty: 30 | Fill #5

## 2017-03-26 MED FILL — VIT D2 1.25 MG (50,000 UNIT: 1.25 MG | 84 days supply | Qty: 12 | Fill #1

## 2017-04-04 MED FILL — SERTRALINE HCL 50 MG TABLET: 50 | 90 days supply | Qty: 180 | Fill #3

## 2017-04-04 MED FILL — lamoTRIgine 100 MG TABS: 100 | 90 days supply | Qty: 180 | Fill #1

## 2017-04-13 MED FILL — EZETIMIBE 10 MG TABS: 10 | 90 days supply | Qty: 90 | Fill #1

## 2017-04-13 MED FILL — PRIMIDONE 50 MG TABLET: 50 | 30 days supply | Qty: 150 | Fill #0

## 2017-04-13 MED FILL — ATORVASTATIN 40 MG TABLET: 40 | 90 days supply | Qty: 90 | Fill #2

## 2017-04-25 DIAGNOSIS — G250 Essential tremor: Secondary | ICD-10-CM | POA: Diagnosis not present

## 2017-04-25 DIAGNOSIS — G40409 Other generalized epilepsy and epileptic syndromes, not intractable, without status epilepticus: Secondary | ICD-10-CM | POA: Diagnosis not present

## 2017-04-25 DIAGNOSIS — Z9689 Presence of other specified functional implants: Secondary | ICD-10-CM | POA: Diagnosis not present

## 2017-04-25 DIAGNOSIS — H532 Diplopia: Secondary | ICD-10-CM | POA: Diagnosis not present

## 2017-04-25 DIAGNOSIS — Z8673 Personal history of transient ischemic attack (TIA), and cerebral infarction without residual deficits: Secondary | ICD-10-CM | POA: Diagnosis not present

## 2017-04-25 DIAGNOSIS — G40909 Epilepsy, unspecified, not intractable, without status epilepticus: Secondary | ICD-10-CM | POA: Diagnosis not present

## 2017-04-25 DIAGNOSIS — R4689 Other symptoms and signs involving appearance and behavior: Secondary | ICD-10-CM | POA: Diagnosis not present

## 2017-04-26 MED FILL — ZOLPIDEM TARTRATE 10 MG TAB: 10 | 30 days supply | Qty: 30 | Fill #0

## 2017-05-07 MED FILL — LEVETIRACETAM ER 500 MG TAB: 500 | 90 days supply | Qty: 450 | Fill #4

## 2017-05-10 DIAGNOSIS — G40309 Generalized idiopathic epilepsy and epileptic syndromes, not intractable, without status epilepticus: Secondary | ICD-10-CM | POA: Diagnosis not present

## 2017-05-10 DIAGNOSIS — H538 Other visual disturbances: Secondary | ICD-10-CM | POA: Diagnosis not present

## 2017-05-10 DIAGNOSIS — H539 Unspecified visual disturbance: Secondary | ICD-10-CM | POA: Diagnosis not present

## 2017-05-14 MED FILL — PRIMIDONE 50 MG TABLET: 50 | 30 days supply | Qty: 150 | Fill #0

## 2017-05-22 MED FILL — DIVALPROEX SOD ER 500 MG TA: 500 | 90 days supply | Qty: 90 | Fill #1

## 2017-05-22 MED FILL — ZOLPIDEM TARTRATE 10 MG TAB: 10 | 30 days supply | Qty: 30 | Fill #1

## 2017-06-01 MED FILL — SYNTHROID 88 MCG TABLET: 88 | 90 days supply | Qty: 90 | Fill #0

## 2017-06-04 ENCOUNTER — Ambulatory Visit (INDEPENDENT_AMBULATORY_CARE_PROVIDER_SITE_OTHER): Payer: Self-pay | Admitting: Nurse Practitioner

## 2017-06-04 ENCOUNTER — Encounter: Payer: Self-pay | Admitting: Nurse Practitioner

## 2017-06-04 VITALS — BP 130/64 | HR 73 | Temp 98.2°F | Wt 156.8 lb

## 2017-06-04 DIAGNOSIS — J0140 Acute pansinusitis, unspecified: Secondary | ICD-10-CM

## 2017-06-04 DIAGNOSIS — J209 Acute bronchitis, unspecified: Secondary | ICD-10-CM

## 2017-06-04 MED ORDER — PSEUDOEPH-BROMPHEN-DM 30-2-10 MG/5ML PO SYRP: 5.0000 mL | ORAL_SOLUTION | Freq: Four times a day (QID) | ORAL | 0 refills | Status: AC | PRN

## 2017-06-04 MED ORDER — DOXYCYCLINE HYCLATE 100 MG PO TABS
100.0000 mg | ORAL_TABLET | Freq: Two times a day (BID) | ORAL | 0 refills | Status: AC
Start: 2017-06-04 — End: 2017-06-14

## 2017-06-04 MED ORDER — FLUTICASONE PROPIONATE 50 MCG/ACT NA SUSP: 2.0000 | Freq: Every day | NASAL | 0 refills | Status: DC

## 2017-06-04 NOTE — Progress Notes (Signed)
Subjective:  Alyssa Mccarthy is a 52 y.o. female who presents for evaluation of possible sinusitis.  Symptoms include bilateral ear pressure/pain, fever: suspected fevers but not measured at home, nasal congestion, post nasal drip, productive cough with yellow colored sputum, sinus pressure, sinus pain and sore throat.  Onset of symptoms was 10 days ago, and has been gradually worsening since that time.  Treatment to date:  Pseudophed.  High risk factors for influenza complications:  none.  Patient states she does have a history of seizures and takes multiple medications.  Patient states her last seizure was about 6 years ago.   The following portions of the patient's history were reviewed and updated as appropriate:  allergies, current medications and past medical history.  Constitutional: positive for anorexia, fatigue and fevers, negative for chills, malaise and sweats Eyes: negative Ears, nose, mouth, throat, and face: positive for nasal congestion, sore throat and ear fullness/pressure, negative for ear drainage Respiratory: positive for cough and sputum, negative for asthma, dyspnea on exertion, pneumonia, stridor and wheezing Cardiovascular: negative Gastrointestinal: negative except for decreased appetite Neurological: positive for headaches, negative for coordination problems, dizziness, gait problems, paresthesia, tremors, vertigo and weakness Allergic/Immunologic: negative Objective:  BP 130/64   Pulse 73   Temp 98.2 F (36.8 C)   Wt 156 lb 12.8 oz (71.1 kg)   SpO2 95%   BMI 28.68 kg/m  General appearance: alert, cooperative, fatigued and no distress Head: Normocephalic, without obvious abnormality, atraumatic Eyes: conjunctivae/corneas clear. PERRL, EOM's intact. Fundi benign. Ears: abnormal TM right ear - mucoid middle ear fluid and abnormal TM left ear - erythematous and mucoid middle ear fluid Nose: Nares normal. Septum midline. Mucosa normal. No drainage or sinus tenderness.,  no discharge, turbinates swollen, inflamed, moderate maxillary sinus tenderness left, moderate frontal sinus tenderness bilateral Throat: lips, mucosa, and tongue normal; teeth and gums normal Lungs: clear to auscultation bilaterally Heart: regular rate and rhythm, S1, S2 normal, no murmur, click, rub or gallop Abdomen: soft, non-tender; bowel sounds normal; no masses,  no organomegaly Pulses: 2+ and symmetric Skin: Skin color, texture, turgor normal. No rashes or lesions Lymph nodes: cervical and submandibular nodes normal Neurologic: Grossly normal    Assessment:  Acute Pansinusitis and Acute Bronchitis    Plan:  Discussed diagnosis and treatment of sinusitis. Educational material distributed and questions answered. Suggested symptomatic OTC remedies. Supportive care with appropriate antipyretics and fluids. Doxycycline and Bromfed per orders. Nasal steroids per orders. Follow up as needed. Meds ordered this encounter  Medications  . doxycycline (VIBRA-TABS) 100 MG tablet    Sig: Take 1 tablet (100 mg total) by mouth 2 (two) times daily for 10 days.    Dispense:  20 tablet    Refill:  0    Order Specific Question:   Supervising Provider    Answer:   Ricard Dillon [[id number]]  . fluticasone (FLONASE) 50 MCG/ACT nasal spray    Sig: Place 2 sprays into both nostrils daily for 10 days.    Dispense:  16 g    Refill:  0    Order Specific Question:   Supervising Provider    Answer:   Ricard Dillon [[id number]]  . brompheniramine-pseudoephedrine-DM 30-2-10 MG/5ML syrup    Sig: Take 5 mLs by mouth 4 (four) times daily as needed for up to 7 days.    Dispense:  150 mL    Refill:  0    Order Specific Question:   Supervising Provider    Answer:  Benay Pillow E [[id number]]

## 2017-06-04 NOTE — Patient Instructions (Signed)
Sinusitis, Adult  Sinusitis is soreness and inflammation of your sinuses. Sinuses are hollow spaces in the bones around your face. Your sinuses are located:  Around your eyes.  In the middle of your forehead.  Behind your nose.  In your cheekbones.    Your sinuses and nasal passages are lined with a stringy fluid (mucus). Mucus normally drains out of your sinuses. When your nasal tissues become inflamed or swollen, the mucus can become trapped or blocked so air cannot flow through your sinuses. This allows bacteria, viruses, and funguses to grow, which leads to infection.  Sinusitis can develop quickly and last for 7?10 days (acute) or for more than 12 weeks (chronic). Sinusitis often develops after a cold.  What are the causes?  This condition is caused by anything that creates swelling in the sinuses or stops mucus from draining, including:  Allergies.  Asthma.  Bacterial or viral infection.  Abnormally shaped bones between the nasal passages.  Nasal growths that contain mucus (nasal polyps).  Narrow sinus openings.  Pollutants, such as chemicals or irritants in the air.  A foreign object stuck in the nose.  A fungal infection. This is rare.    What increases the risk?  The following factors may make you more likely to develop this condition:  Having allergies or asthma.  Having had a recent cold or respiratory tract infection.  Having structural deformities or blockages in your nose or sinuses.  Having a weak immune system.  Doing a lot of swimming or diving.  Overusing nasal sprays.  Smoking.    What are the signs or symptoms?  The main symptoms of this condition are pain and a feeling of pressure around the affected sinuses. Other symptoms include:  Upper toothache.  Earache.  Headache.  Bad breath.  Decreased sense of smell and taste.  A cough that may get worse at night.  Fatigue.  Fever.  Thick drainage from your nose. The drainage is often green and it may contain pus (purulent).  Stuffy nose or  congestion.  Postnasal drip. This is when extra mucus collects in the throat or back of the nose.  Swelling and warmth over the affected sinuses.  Sore throat.  Sensitivity to light.    How is this diagnosed?  This condition is diagnosed based on symptoms, a medical history, and a physical exam. To find out if your condition is acute or chronic, your health care provider may:  Look in your nose for signs of nasal polyps.  Tap over the affected sinus to check for signs of infection.  View the inside of your sinuses using an imaging device that has a light attached (endoscope).    If your health care provider suspects that you have chronic sinusitis, you may also:  Be tested for allergies.  Have a sample of mucus taken from your nose (nasal culture) and checked for bacteria.  Have a mucus sample examined to see if your sinusitis is related to an allergy.    If your sinusitis does not respond to treatment and it lasts longer than 8 weeks, you may have an MRI or CT scan to check your sinuses. These scans also help to determine how severe your infection is.  In rare cases, a bone biopsy may be done to rule out more serious types of fungal sinus disease.  How is this treated?  Treatment for sinusitis depends on the cause and whether your condition is chronic or acute. If a virus is causing   your sinusitis, your symptoms will go away on their own within 10 days. You may be given medicines to relieve your symptoms, including:  Topical nasal decongestants. They shrink swollen nasal passages and let mucus drain from your sinuses.  Antihistamines. These drugs block inflammation that is triggered by allergies. This can help to ease swelling in your nose and sinuses.  Topical nasal corticosteroids. These are nasal sprays that ease inflammation and swelling in your nose and sinuses.  Nasal saline washes. These rinses can help to get rid of thick mucus in your nose.    If your condition is caused by bacteria, you will be given an  antibiotic medicine. If your condition is caused by a fungus, you will be given an antifungal medicine.  Surgery may be needed to correct underlying conditions, such as narrow nasal passages. Surgery may also be needed to remove polyps.  Follow these instructions at home:  Medicines  Take, use, or apply over-the-counter and prescription medicines only as told by your health care provider. These may include nasal sprays.  If you were prescribed an antibiotic medicine, take it as told by your health care provider. Do not stop taking the antibiotic even if you start to feel better.  Hydrate and Humidify  Drink enough water to keep your urine clear or pale yellow. Staying hydrated will help to thin your mucus.  Use a cool mist humidifier to keep the humidity level in your home above 50%.  Inhale steam for 10-15 minutes, 3-4 times a day or as told by your health care provider. You can do this in the bathroom while a hot shower is running.  Limit your exposure to cool or dry air.  Rest  Rest as much as possible.  Sleep with your head raised (elevated).  Make sure to get enough sleep each night.  General instructions  Apply a warm, moist washcloth to your face 3-4 times a day or as told by your health care provider. This will help with discomfort.  Wash your hands often with soap and water to reduce your exposure to viruses and other germs. If soap and water are not available, use hand sanitizer.  Do not smoke. Avoid being around people who are smoking (secondhand smoke).  Keep all follow-up visits as told by your health care provider. This is important.  Contact a health care provider if:  You have a fever.  Your symptoms get worse.  Your symptoms do not improve within 10 days.  Get help right away if:  You have a severe headache.  You have persistent vomiting.  You have pain or swelling around your face or eyes.  You have vision problems.  You develop confusion.  Your neck is stiff.  You have trouble breathing.  This  information is not intended to replace advice given to you by your health care provider. Make sure you discuss any questions you have with your health care provider.  Document Released: 01/09/2005 Document Revised: 09/05/2015 Document Reviewed: 11/04/2014  [employer]vier Interactive Patient Education  2018 [employer]  Acute Bronchitis, Adult  Acute bronchitis is sudden (acute) swelling of the air tubes (bronchi) in the lungs. Acute bronchitis causes these tubes to fill with mucus, which can make it hard to breathe. It can also cause coughing or wheezing.  In adults, acute bronchitis usually goes away within 2 weeks. A cough caused by bronchitis may last up to 3 weeks. Smoking, allergies, and asthma can make the condition worse. Repeated episodes of bronchitis   may cause further lung problems, such as chronic obstructive pulmonary disease (COPD).  What are the causes?  This condition can be caused by germs and by substances that irritate the lungs, including:   Cold and flu viruses. This condition is most often caused by the same virus that causes a cold.   Bacteria.   Exposure to tobacco smoke, dust, fumes, and air pollution.    What increases the risk?  This condition is more likely to develop in people who:   Have close contact with someone with acute bronchitis.   Are exposed to lung irritants, such as tobacco smoke, dust, fumes, and vapors.   Have a weak immune system.   Have a respiratory condition such as asthma.    What are the signs or symptoms?  Symptoms of this condition include:   A cough.   Coughing up clear, yellow, or green mucus.   Wheezing.   Chest congestion.   Shortness of breath.   A fever.   Body aches.   Chills.   A sore throat.    How is this diagnosed?  This condition is usually diagnosed with a physical exam. During the exam, your health care provider may order tests, such as chest X-rays, to rule out other conditions. He or she may also:   Test a sample of your mucus for  bacterial infection.   Check the level of oxygen in your blood. This is done to check for pneumonia.   Do a chest X-ray or lung function testing to rule out pneumonia and other conditions.   Perform blood tests.    Your health care provider will also ask about your symptoms and medical history.  How is this treated?  Most cases of acute bronchitis clear up over time without treatment. Your health care provider may recommend:   Drinking more fluids. Drinking more makes your mucus thinner, which may make it easier to breathe.   Taking a medicine for a fever or cough.   Taking an antibiotic medicine.   Using an inhaler to help improve shortness of breath and to control a cough.   Using a cool mist vaporizer or humidifier to make it easier to breathe.    Follow these instructions at home:  Medicines   Take over-the-counter and prescription medicines only as told by your health care provider.   If you were prescribed an antibiotic, take it as told by your health care provider. Do not stop taking the antibiotic even if you start to feel better.  General instructions   Get plenty of rest.   Drink enough fluids to keep your urine clear or pale yellow.   Avoid smoking and secondhand smoke. Exposure to cigarette smoke or irritating chemicals will make bronchitis worse. If you smoke and you need help quitting, ask your health care provider. Quitting smoking will help your lungs heal faster.   Use an inhaler, cool mist vaporizer, or humidifier as told by your health care provider.   Keep all follow-up visits as told by your health care provider. This is important.  How is this prevented?  To lower your risk of getting this condition again:   Wash your hands often with soap and water. If soap and water are not available, use hand sanitizer.   Avoid contact with people who have cold symptoms.   Try not to touch your hands to your mouth, nose, or eyes.   Make sure to get the flu shot every year.      Contact a  health care provider if:   Your symptoms do not improve in 2 weeks of treatment.  Get help right away if:   You cough up blood.   You have chest pain.   You have severe shortness of breath.   You become dehydrated.   You faint or keep feeling like you are going to faint.   You keep vomiting.   You have a severe headache.   Your fever or chills gets worse.  This information is not intended to replace advice given to you by your health care provider. Make sure you discuss any questions you have with your health care provider.  Document Released: 02/17/2004 Document Revised: 08/04/2015 Document Reviewed: 06/30/2015  [employer]vier Interactive Patient Education  2018 [employer]

## 2017-06-06 ENCOUNTER — Telehealth: Payer: Self-pay

## 2017-06-06 NOTE — Telephone Encounter (Signed)
Left a vm stating that we was calling to see how she was doing. Stating that if she needed to come in or give [place] a call to please do so.

## 2017-06-19 MED FILL — PRIMIDONE 50 MG TABLET: 50 | 30 days supply | Qty: 150 | Fill #1

## 2017-06-25 ENCOUNTER — Encounter: Payer: Self-pay | Admitting: Neurology

## 2017-06-25 ENCOUNTER — Other Ambulatory Visit: Payer: Self-pay

## 2017-06-25 MED ORDER — ZOLPIDEM TARTRATE 10 MG PO TABS: 10.0000 mg | ORAL_TABLET | Freq: Every day | ORAL | 5 refills | Status: DC

## 2017-06-25 MED FILL — ZOLPIDEM TARTRATE 10 MG TAB: 10 | 30 days supply | Qty: 30 | Fill #0

## 2017-07-10 MED FILL — lamoTRIgine 100 MG TABS: 100 | 90 days supply | Qty: 180 | Fill #2

## 2017-07-10 MED FILL — EZETIMIBE 10 MG TABS: 10 | 90 days supply | Qty: 90 | Fill #2

## 2017-07-10 MED FILL — SERTRALINE HCL 50 MG TABS: 50 | 90 days supply | Qty: 180 | Fill #0

## 2017-07-13 DIAGNOSIS — I10 Essential (primary) hypertension: Secondary | ICD-10-CM | POA: Diagnosis not present

## 2017-07-13 DIAGNOSIS — G250 Essential tremor: Secondary | ICD-10-CM | POA: Diagnosis not present

## 2017-07-13 DIAGNOSIS — F418 Other specified anxiety disorders: Secondary | ICD-10-CM | POA: Diagnosis not present

## 2017-07-13 DIAGNOSIS — Z1389 Encounter for screening for other disorder: Secondary | ICD-10-CM | POA: Diagnosis not present

## 2017-07-13 DIAGNOSIS — R7301 Impaired fasting glucose: Secondary | ICD-10-CM | POA: Diagnosis not present

## 2017-07-13 DIAGNOSIS — R569 Unspecified convulsions: Secondary | ICD-10-CM | POA: Diagnosis not present

## 2017-07-13 DIAGNOSIS — E038 Other specified hypothyroidism: Secondary | ICD-10-CM | POA: Diagnosis not present

## 2017-07-13 DIAGNOSIS — M859 Disorder of bone density and structure, unspecified: Secondary | ICD-10-CM | POA: Diagnosis not present

## 2017-07-13 DIAGNOSIS — I639 Cerebral infarction, unspecified: Secondary | ICD-10-CM | POA: Diagnosis not present

## 2017-07-13 DIAGNOSIS — E7849 Other hyperlipidemia: Secondary | ICD-10-CM | POA: Diagnosis not present

## 2017-07-23 MED FILL — ATORVASTATIN 40 MG TABLET: 40 | 90 days supply | Qty: 90 | Fill #0

## 2017-07-23 MED FILL — PRIMIDONE 50 MG TABLET: 50 | 30 days supply | Qty: 150 | Fill #2

## 2017-07-24 MED FILL — ZOLPIDEM TARTRATE 10 MG TAB: 10 | 30 days supply | Qty: 30 | Fill #1

## 2017-07-30 ENCOUNTER — Ambulatory Visit: Payer: 59 | Admitting: Neurology

## 2017-07-30 ENCOUNTER — Other Ambulatory Visit: Payer: Self-pay

## 2017-07-30 ENCOUNTER — Encounter: Payer: Self-pay | Admitting: Neurology

## 2017-07-30 VITALS — BP 152/80 | HR 61 | Ht 62.0 in | Wt 157.0 lb

## 2017-07-30 DIAGNOSIS — F32A Depression, unspecified: Secondary | ICD-10-CM

## 2017-07-30 DIAGNOSIS — G4700 Insomnia, unspecified: Secondary | ICD-10-CM | POA: Diagnosis not present

## 2017-07-30 DIAGNOSIS — G40309 Generalized idiopathic epilepsy and epileptic syndromes, not intractable, without status epilepticus: Secondary | ICD-10-CM

## 2017-07-30 DIAGNOSIS — F329 Major depressive disorder, single episode, unspecified: Secondary | ICD-10-CM

## 2017-07-30 MED ORDER — DIVALPROEX SODIUM ER 500 MG PO TB24: 500.0000 mg | ORAL_TABLET | Freq: Every day | ORAL | 3 refills | Status: DC

## 2017-07-30 MED ORDER — LAMOTRIGINE 100 MG PO TABS: 100.0000 mg | ORAL_TABLET | Freq: Two times a day (BID) | ORAL | 3 refills | Status: DC

## 2017-07-30 MED ORDER — SERTRALINE HCL 50 MG PO TABS: ORAL_TABLET | ORAL | 3 refills | Status: DC

## 2017-07-30 MED ORDER — LEVETIRACETAM ER 500 MG PO TB24: 2500.0000 mg | ORAL_TABLET | Freq: Every day | ORAL | 3 refills | Status: DC

## 2017-07-30 MED ORDER — ZOLPIDEM TARTRATE 10 MG PO TABS: 10.0000 mg | ORAL_TABLET | Freq: Every day | ORAL | 5 refills | Status: DC

## 2017-07-30 MED FILL — LEVETIRACETAM ER 500 MG TAB: 500 | 90 days supply | Qty: 450 | Fill #0

## 2017-07-30 NOTE — Patient Instructions (Signed)
1. Increase Zoloft to 150mg  daily 2. Continue all your other medications 3. Proceed with resuming counseling 4. Follow-up in 6 months, call for any changes  Seizure Precautions: 1. If medication has been prescribed for you to prevent seizures, take it exactly as directed.  Do not stop taking the medicine without talking to your doctor first, even if you have not had a seizure in a long time.   2. Avoid activities in which a seizure would cause danger to yourself or to others.  Don't operate dangerous machinery, swim alone, or climb in high or dangerous places, such as on ladders, roofs, or girders.  Do not drive unless your doctor says you may.  3. If you have any warning that you may have a seizure, lay down in a safe place where you can't hurt yourself.    4.  No driving for 6 months from last seizure, as per [facility].   Please refer to the following link on the [place] website for more information: [email or web address]   5.  Maintain good sleep hygiene. Avoid alcohol.  6.  Contact your doctor if you have any problems that may be related to the medicine you are taking.  7.  Call 911 and bring the patient back to the ED if:        A.  The seizure lasts longer than 5 minutes.       B.  The patient doesn't awaken shortly after the seizure  C.  The patient has new problems such as difficulty seeing, speaking or moving  D.  The patient was injured during the seizure  E.  The patient has a temperature over 102 F (39C)  F.  The patient vomited and now is having trouble breathing

## 2017-07-30 NOTE — Progress Notes (Signed)
NEUROLOGY FOLLOW UP OFFICE NOTE  Alyssa Mccarthy [id number] 14-Jul-1965  HISTORY OF PRESENT ILLNESS: I had the pleasure of seeing Alyssa Mccarthy in follow-up in the neurology clinic on 07/30/2017.  The patient was last seen 6 months ago for seizures. She is alone in the office today. Since her last visit, she had a repeat MRI brain and MRA head and neck last April 2019 after reporting vision changes to Dr. [name]. No acute changes seen, no significant stenosis. DBS settings were adjusted and she reports improvement in tremor. Her vision is better as well, she reports that closing one eye helps, as vision changes occur more with big differences in ambient light. She denies any seizures or seizure-like symptoms. No unresponsive episodes or myoclonic jerks. She reports more mood changes over the past 1-2 months, she is more angry, "I don't like being around me." She had similar anger issues in the past and was started on Zoloft, then again 2 years ago and Zoloft dose was again increased to 100mg  daily. She sees a Social worker. Sleep is good with Ambien. She denies any headaches, dizziness, focal numbness/tingling/weakness. She fell off a stepladder recently, no loss of consciousness or significant injuries.   HPI 01/29/2017: This is a pleasant 52 yo RH woman with a history of idiopathic generalized epilepsy, essential tremor s/p VIM DBS, presenting to establish care. She had been seeing epileptologist Dr. [name] at [facility], records were reviewed. She sees Movement Disorder specialist Dr. [name] for the essential tremor. Seizures started around age 52. She reports seizures were well-controlled for 25 years, then after she had her daughter, she was having more seizures for 3-4 years, then overall has been well-controlled the past 10 years. She has no clear aura, describes body jerking that progresses to a generalized convulsion. Flashing lights and sleep deprivation are seizure triggers. She has not had any seizures since around  2015 when Depakote was re-added to her regimen. She is currently on Lamictal 100mg  BID, Keppra XR 2500mg  daily, and Depakote ER 500mg  daily with no side effects. She had been on Depakote for many years in the past but had weight gain, it was stopped, then restarted around 4 years ago. She is also on Primidone for the tremor. She denies any olfactory/gustatory hallucinations, deja vu, rising epigastric sensation, focal numbness/tingling/weakness, myoclonic jerks. She rarely has a funny feeling that "something is not quite right" and would take a prn clonazepam as a precaution, she has not needed this in about a year.   She denies any headaches, dizziness, diplopia, dysarthria/dysphagia, neck/back pain, bowel/bladder dysfunction. Sleep is good. She had been taking Zoloft increased dose 4 years ago due to irritability, improved with higher dose. She was diagnosed with a stroke in January 2018 when she had a bad headache and anisocoria. MRI brain showed subtle T2 hyperintensity in the pons, R>L. She saw Dr. 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 She has chronic mild right-sided sensory changes since then. She has also had symptoms every once in a while where there is something she can't see (not clearly on peripheral vision), she blinks and vision is back to normal. She has chronic numbness on the left elbow region. She has noticed she has no interest in eating, but makes herself eat. 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   Diagnostic Data: MRI brain in March 2011 reported as normal. EEG in March 2010 abnormal due to generalized polyspike and wave, consistent with a primary generalized  epilepsy Prior AEDs: Phenobarbital, Zonisamide, Dilantin. On a different generic Levetiracetam, she had dry hands and peeling in fingertips, which resolved after switching to a different generic  Epilepsy Risk Factors:  There is a strong family history of seizures in her paternal grandfather,  father, and daughter. Otherwise she had a normal birth and early development.  There is no history of febrile convulsions, CNS infections such as meningitis/encephalitis, significant traumatic brain injury, neurosurgical procedures.  PAST MEDICAL HISTORY: Past Medical History:  Diagnosis Date  . Essential tremor   . Hyperlipidemia   . Seizure disorder ([place])   . Stroke [facility])     MEDICATIONS: Current Outpatient Medications on File Prior to Visit  Medication Sig Dispense Refill  . aspirin EC 325 MG EC tablet Take 1 tablet (325 mg total) by mouth daily. 30 tablet 1  . atorvastatin (LIPITOR) 40 MG tablet Take 1 tablet (40 mg total) by mouth every evening. 30 tablet 1  . clonazePAM (KLONOPIN) 0.5 MG tablet Take 0.5-1 mg by mouth daily as needed for seizure.    . dicyclomine (BENTYL) 10 MG capsule Take 10 mg by mouth daily as needed for spasms.    . divalproex (DEPAKOTE ER) 500 MG 24 hr tablet Take 500 mg by mouth at bedtime.    Marland Kitchen ezetimibe (ZETIA) 10 MG tablet     . fluticasone (FLONASE) 50 MCG/ACT nasal spray Place 2 sprays into both nostrils daily for 10 days. 16 g 0  . HYDROcodone-acetaminophen (NORCO/VICODIN) 5-325 MG tablet Take 1 tablet by mouth every 6 (six) hours as needed (headache). 20 tablet 0  . ibuprofen (ADVIL,MOTRIN) 200 MG tablet Take 400 mg by mouth every 6 (six) hours as needed for headache or moderate pain.    Marland Kitchen lamoTRIgine (LAMICTAL) 100 MG tablet Take 100 mg by mouth 2 (two) times daily.     Marland Kitchen levETIRAcetam (KEPPRA XR) 500 MG 24 hr tablet Take 2,500 mg by mouth daily.    Marland Kitchen levothyroxine (SYNTHROID, LEVOTHROID) 88 MCG tablet Take 88 mcg by mouth daily before breakfast.    . naproxen sodium (ANAPROX) 220 MG tablet Take 220 mg by mouth 2 (two) times daily as needed (pain).    . primidone (MYSOLINE) 50 MG tablet Take 100-150 mg by mouth See admin instructions. Takes 2 tabs in am and 3 tabs in pm    . sertraline (ZOLOFT) 50 MG tablet Take 100 mg by mouth daily.    . Soft Lens  Products (REWETTING DROPS) SOLN Apply 1 drop to eye 3 (three) times daily as needed (for contacts).    Marland Kitchen UNABLE TO FIND Outpatient PT 1 each 0  . Vitamin D, Ergocalciferol, (DRISDOL) 50000 units CAPS capsule Take 50,000 Units by mouth every Sunday.    . zolpidem (AMBIEN) 10 MG tablet Take 1 tablet (10 mg total) by mouth at bedtime. 30 tablet 5   No current facility-administered medications on file prior to visit.     ALLERGIES: Allergies  Allergen Reactions  . Gluten Meal Diarrhea    Severe diarrhea  . Other Other (See Comments)    Bleach-respiratory distress  . Plavix [Clopidogrel Bisulfate] Itching and Rash    Hands and feet   . Penicillins Itching, Rash and Other (See Comments)    Childhood allergy- specifics unknown; tolerated amoxicillin and ancef since then     FAMILY HISTORY: Family History  Problem Relation Age of Onset  . Heart attack Father 33  . Stroke Neg Hx   . Breast cancer Neg Hx  SOCIAL HISTORY: Social History   Socioeconomic History  . Marital status: Married    Spouse name: Not on file  . Number of children: Not on file  . Years of education: Not on file  . Highest education level: Not on file  Occupational History  . Not on file  Social Needs  . Financial resource strain: Not on file  . Food insecurity:    Worry: Not on file    Inability: Not on file  . Transportation needs:    Medical: Not on file    Non-medical: Not on file  Tobacco Use  . Smoking status: Never Smoker  . Smokeless tobacco: Never Used  Substance and Sexual Activity  . Alcohol use: No  . Drug use: No  . Sexual activity: Not on file  Lifestyle  . Physical activity:    Days per week: Not on file    Minutes per session: Not on file  . Stress: Not on file  Relationships  . Social connections:    Talks on phone: Not on file    Gets together: Not on file    Attends religious service: Not on file    Active member of club or organization: Not on file    Attends meetings  of clubs or organizations: Not on file    Relationship status: Not on file  . Intimate partner violence:    Fear of current or ex partner: Not on file    Emotionally abused: Not on file    Physically abused: Not on file    Forced sexual activity: Not on file  Other Topics Concern  . Not on file  Social History Narrative   Lives in 1 story home with her husband and 2 sons   Has 2 adult sons   Has bach. Degree   Works as Designer, jewellery    REVIEW OF SYSTEMS: Constitutional: No fevers, chills, or sweats, no generalized fatigue, change in appetite Eyes: No visual changes, double vision, eye pain Ear, nose and throat: No hearing loss, ear pain, nasal congestion, sore throat Cardiovascular: No chest pain, palpitations Respiratory:  No shortness of breath at rest or with exertion, wheezes GastrointestinaI: No nausea, vomiting, diarrhea, abdominal pain, fecal incontinence Genitourinary:  No dysuria, urinary retention or frequency Musculoskeletal:  No neck pain, back pain Integumentary: No rash, pruritus, skin lesions Neurological: as above Psychiatric: + depression, insomnia, anxiety Endocrine: No palpitations, fatigue, diaphoresis, mood swings, change in appetite, change in weight, increased thirst Hematologic/Lymphatic:  No anemia, purpura, petechiae. Allergic/Immunologic: no itchy/runny eyes, nasal congestion, recent allergic reactions, rashes  PHYSICAL EXAM: Vitals:   07/30/17 1506  BP: (!) 152/80  Pulse: 61  SpO2: 95%   General: No acute distress Head:  Normocephalic/atraumatic Neck: supple, no paraspinal tenderness, full range of motion Heart:  Regular rate and rhythm Lungs:  Clear to auscultation bilaterally Back: No paraspinal tenderness Skin/Extremities: No rash, no edema Neurological Exam: alert and oriented to person, place, and time. No aphasia or dysarthria. Fund of knowledge is appropriate.  Recent and remote memory are intact.  Attention and concentration are  normal.    Able to name objects and repeat phrases. Cranial nerves: Pupils asymmetric (chronic), reactive to light.  Extraocular movements intact with no nystagmus. Visual fields full. Facial sensation intact. No facial asymmetry. Tongue, uvula, palate midline.  Motor: Bulk and tone normal, muscle strength 5/5 throughout with no pronator drift.  Sensation to light touch intact.  No extinction to double simultaneous stimulation.  Deep  tendon reflexes 2+ throughout, toes downgoing.  Finger to nose testing intact.  Gait narrow-based and steady, able to tandem walk adequately.  Romberg negative. Mild side to side head tremor and bilateral postural tremor.  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 She has been seizure-free since 2015 with re-addition of low dose Depakote ER 500mg  daily. She is also on Keppra XR 2500mg  daily and Lamictal 100mg  BID with no side effects. She is also taking Primidone for the tremor. She is reporting more anger issues, prior similar symptoms were treated with Zoloft adjustment, we agreed to increase Zoloft to 150mg  daily. We also discussed the option of slightly reducing Keppra dose to 2000mg  daily since she is on 4 AEDs at this point, but she is hesitant and would like to try Zoloft adjustment first. She was advised to resume counseling visits. She takes Ambien for insomnia, refills sent. She is aware of [place] driving laws to stop driving after a seizure until 6 months seizure-free. She will follow-up in 6 months and knows to call for any changes.   Thank you for allowing me to participate in her care.  Please do not hesitate to call for any questions or concerns.  The duration of this appointment visit was 30 minutes of face-to-face time with the patient.  Greater than 50% of this time was spent in counseling, explanation of diagnosis, planning of  further management, and coordination of care.   Ellouise Newer, M.D.   CC: Dr. Forde Dandy

## 2017-08-14 ENCOUNTER — Ambulatory Visit: Payer: 59 | Admitting: Psychology

## 2017-08-20 ENCOUNTER — Ambulatory Visit (INDEPENDENT_AMBULATORY_CARE_PROVIDER_SITE_OTHER): Payer: 59 | Admitting: Psychology

## 2017-08-20 DIAGNOSIS — F324 Major depressive disorder, single episode, in partial remission: Secondary | ICD-10-CM

## 2017-08-20 MED FILL — DIVALPROEX SOD ER 500 MG TA: 500 | 90 days supply | Qty: 90 | Fill #2

## 2017-08-20 MED FILL — ZOLPIDEM TARTRATE 10 MG TAB: 10 | 30 days supply | Qty: 30 | Fill #2

## 2017-08-22 MED FILL — PRIMIDONE 50 MG TABLET: 50 | 30 days supply | Qty: 150 | Fill #3

## 2017-09-04 DIAGNOSIS — Z9689 Presence of other specified functional implants: Secondary | ICD-10-CM | POA: Diagnosis not present

## 2017-09-04 DIAGNOSIS — G250 Essential tremor: Secondary | ICD-10-CM | POA: Diagnosis not present

## 2017-09-04 DIAGNOSIS — R51 Headache: Secondary | ICD-10-CM | POA: Diagnosis not present

## 2017-09-04 DIAGNOSIS — Z4589 Encounter for adjustment and management of other implanted devices: Secondary | ICD-10-CM | POA: Diagnosis not present

## 2017-09-06 MED FILL — SYNTHROID 88 MCG TABLET: 88 | 90 days supply | Qty: 90 | Fill #1

## 2017-09-11 ENCOUNTER — Ambulatory Visit (INDEPENDENT_AMBULATORY_CARE_PROVIDER_SITE_OTHER): Payer: 59 | Admitting: Psychology

## 2017-09-11 DIAGNOSIS — F324 Major depressive disorder, single episode, in partial remission: Secondary | ICD-10-CM

## 2017-09-17 MED FILL — ZOLPIDEM TARTRATE 10 MG TAB: 10 | 30 days supply | Qty: 30 | Fill #3

## 2017-09-17 MED FILL — SERTRALINE HCL 50 MG TABLET: 50 | 90 days supply | Qty: 270 | Fill #0

## 2017-09-18 ENCOUNTER — Ambulatory Visit
Admission: RE | Admit: 2017-09-18 | Discharge: 2017-09-18 | Disposition: A | Discharge status: Home / Self Care | Payer: 59 | Source: Ambulatory Visit | Attending: Registered Nurse | Admitting: Registered Nurse

## 2017-09-18 ENCOUNTER — Other Ambulatory Visit: Payer: Self-pay | Admitting: Registered Nurse

## 2017-09-18 DIAGNOSIS — Z6828 Body mass index (BMI) 28.0-28.9, adult: Secondary | ICD-10-CM | POA: Diagnosis not present

## 2017-09-18 DIAGNOSIS — R195 Other fecal abnormalities: Secondary | ICD-10-CM | POA: Diagnosis not present

## 2017-09-18 DIAGNOSIS — R1032 Left lower quadrant pain: Secondary | ICD-10-CM

## 2017-09-18 DIAGNOSIS — K625 Hemorrhage of anus and rectum: Secondary | ICD-10-CM | POA: Diagnosis not present

## 2017-09-18 MED ORDER — IOPAMIDOL (ISOVUE-300) INJECTION 61%
100.0000 mL | Freq: Once | INTRAVENOUS | Status: AC | PRN
  Administered 2017-09-18: 100 mL via INTRAVENOUS

## 2017-09-18 MED FILL — PANTOPRAZOLE SOD DR 40 MG T: 40 | 30 days supply | Qty: 30 | Fill #0

## 2017-09-25 ENCOUNTER — Ambulatory Visit (INDEPENDENT_AMBULATORY_CARE_PROVIDER_SITE_OTHER): Payer: 59 | Admitting: Psychology

## 2017-09-25 DIAGNOSIS — F324 Major depressive disorder, single episode, in partial remission: Secondary | ICD-10-CM | POA: Diagnosis not present

## 2017-09-25 MED FILL — PRIMIDONE 50 MG TABLET: 50 | 30 days supply | Qty: 150 | Fill #4

## 2017-09-28 DIAGNOSIS — Z8673 Personal history of transient ischemic attack (TIA), and cerebral infarction without residual deficits: Secondary | ICD-10-CM | POA: Diagnosis not present

## 2017-09-28 DIAGNOSIS — K625 Hemorrhage of anus and rectum: Secondary | ICD-10-CM | POA: Diagnosis not present

## 2017-09-28 DIAGNOSIS — R195 Other fecal abnormalities: Secondary | ICD-10-CM | POA: Diagnosis not present

## 2017-09-28 DIAGNOSIS — K580 Irritable bowel syndrome with diarrhea: Secondary | ICD-10-CM | POA: Diagnosis not present

## 2017-09-29 DIAGNOSIS — Z23 Encounter for immunization: Secondary | ICD-10-CM | POA: Diagnosis not present

## 2017-10-01 DIAGNOSIS — Z6829 Body mass index (BMI) 29.0-29.9, adult: Secondary | ICD-10-CM | POA: Diagnosis not present

## 2017-10-01 DIAGNOSIS — Z01419 Encounter for gynecological examination (general) (routine) without abnormal findings: Secondary | ICD-10-CM | POA: Diagnosis not present

## 2017-10-02 MED FILL — ESTRADIOL 0.1 MG/GM CREA: 0.1 | 84 days supply | Qty: 43 | Fill #0

## 2017-10-02 MED FILL — CLOBETASOL PROP 0.05% OINT: 0.05 | 30 days supply | Qty: 30 | Fill #0

## 2017-10-11 ENCOUNTER — Ambulatory Visit (INDEPENDENT_AMBULATORY_CARE_PROVIDER_SITE_OTHER): Payer: 59 | Admitting: Psychology

## 2017-10-11 DIAGNOSIS — F324 Major depressive disorder, single episode, in partial remission: Secondary | ICD-10-CM

## 2017-10-15 DIAGNOSIS — K317 Polyp of stomach and duodenum: Secondary | ICD-10-CM | POA: Diagnosis not present

## 2017-10-15 DIAGNOSIS — D123 Benign neoplasm of transverse colon: Secondary | ICD-10-CM | POA: Diagnosis not present

## 2017-10-15 DIAGNOSIS — K591 Functional diarrhea: Secondary | ICD-10-CM | POA: Diagnosis not present

## 2017-10-15 DIAGNOSIS — R195 Other fecal abnormalities: Secondary | ICD-10-CM | POA: Diagnosis not present

## 2017-10-15 DIAGNOSIS — D490 Neoplasm of unspecified behavior of digestive system: Secondary | ICD-10-CM | POA: Diagnosis not present

## 2017-10-17 MED FILL — EZETIMIBE 10 MG TABLET: 10 | 90 days supply | Qty: 90 | Fill #3

## 2017-10-17 MED FILL — ATORVASTATIN 40 MG TABLET: 40 | 90 days supply | Qty: 90 | Fill #1

## 2017-10-22 MED FILL — lamoTRIgine 100 MG TABS: 100 | 90 days supply | Qty: 180 | Fill #3

## 2017-10-22 MED FILL — ZOLPIDEM TARTRATE 10 MG TAB: 10 | 30 days supply | Qty: 30 | Fill #4

## 2017-10-25 ENCOUNTER — Ambulatory Visit (INDEPENDENT_AMBULATORY_CARE_PROVIDER_SITE_OTHER): Payer: 59 | Admitting: Psychology

## 2017-10-25 DIAGNOSIS — F324 Major depressive disorder, single episode, in partial remission: Secondary | ICD-10-CM | POA: Diagnosis not present

## 2017-10-29 MED FILL — PRIMIDONE 50 MG TABLET: 50 | 30 days supply | Qty: 150 | Fill #5

## 2017-11-05 MED FILL — LEVETIRACETAM ER 500 MG TAB: 500 | 90 days supply | Qty: 450 | Fill #1

## 2017-11-08 DIAGNOSIS — K621 Rectal polyp: Secondary | ICD-10-CM | POA: Diagnosis not present

## 2017-11-12 ENCOUNTER — Encounter: Payer: Self-pay | Admitting: Neurology

## 2017-11-14 ENCOUNTER — Telehealth: Payer: Self-pay | Admitting: Neurology

## 2017-11-14 NOTE — Telephone Encounter (Signed)
Spoke with Alyssa Mccarthy.  Verbal clearance given.  Re-faxed clearance letter to back office fax at [facility] - [phone]

## 2017-11-14 NOTE — Telephone Encounter (Signed)
Elmo Putt from Dr.Whites office is calling in needing urgent neuro clearance on this patient. Please either call them at [phone] or fax info to [phone]. Thanks!

## 2017-11-19 MED FILL — DIVALPROEX SOD ER 500 MG TA: 500 | 90 days supply | Qty: 90 | Fill #3

## 2017-11-20 MED FILL — ZOLPIDEM TARTRATE 10 MG TAB: 10 | 30 days supply | Qty: 30 | Fill #5

## 2017-11-22 ENCOUNTER — Ambulatory Visit: Payer: Self-pay | Admitting: Surgery

## 2017-11-22 DIAGNOSIS — D128 Benign neoplasm of rectum: Secondary | ICD-10-CM | POA: Diagnosis not present

## 2017-11-22 MED FILL — NEOMYCIN 500 MG TABLET: 500 | 1 days supply | Qty: 6 | Fill #0

## 2017-11-22 MED FILL — metroNIDAZOLE 500 MG TABS: 500 | 1 days supply | Qty: 6 | Fill #0

## 2017-11-26 ENCOUNTER — Ambulatory Visit: Payer: Self-pay | Admitting: Surgery

## 2017-11-26 NOTE — H&P (View-Only) (Signed)
Rolland Porter Documented: 11/22/2017 8:58 AM Location: [facility] Patient #: [id number] DOB: May 13, 1965 Married / Language: Cleophus Molt / Race: White Female  History of Present Illness Alyssa Hector MD; 11/26/2017 11:40 AM) The patient is a 52 year old female who presents with a colorectal polyp. Note for "Colorectal polyp": ` ` ` Patient sent for surgical consultation at the request of Dr Dema Severin  Chief Complaint: Rectal polyp. Request for transanal removal ` ` ` The patient is a pleasant woman. History of hx of epilepsy, CVA (?thrombotic), deep brain stimulator for essential tremor. Has a neural stimulator. Followed by neurology in town as well as [facility]. Followed by gastroenterology. [facility]. Dr. Cristina Gong. On this, there was a large polyp found in the rectum which was biopsied and returned tubular villous adenoma. Additional polyps removed and came back as tubular adenomas. Biopsies were taken to evaluate for microscopic colitis. These were negative. She also had an EGD done which was otherwise unremarkable and biopsies of the small bowel demonstrated no findings consistent with celiac's. She has had 3 prior colonoscopies in the past and polyps removed with those. Felt too large to safely resect. Request for transanal excision made. Saw my younger partner. Felt that she would be a good candidate for that. Referred to me for consideration.  No personal nor family history of GI/colon cancer, inflammatory bowel disease, irritable bowel syndrome, allergy such as Celiac Sprue, dietary/dairy problems, colitis, ulcers nor gastritis. No recent sick contacts/gastroenteritis. No travel outside the country. No changes in diet. No dysphagia to solids or liquids. No significant heartburn or reflux. No hematochezia, hematemesis, coffee ground emesis. No evidence of prior gastric/peptic ulceration. She walk several miles without difficulty. Moves her bowels once or twice a  day.   GLO:VFIEPPIR (controlled on Depakote and Lamictal), CVA (? thrombotic - PCP is Dr. Carrolyn Meiers), hyperlipidemia (well controlled on statin), essential tremor (well controlled with DBS)  PSH: Open appendectomy Fissure-closure and hemorrhoidal banding, Dr. Harlow Asa Lap-assisted vaginal hysterectomy  FHx: Paternal grandmother had colon cancer Paternal grandfather had gastric cancer  Social: Denies use of tobacco/drugs; social EtOH. 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 Otherwise all other 12 point ROS are negative) ` ` ` `   Allergies (Chemira Jones, CMA; 11/22/2017 8:59 AM) penicillAMINE *Miscellaneous Therapeutic Classes** Protonix *ULCER DRUGS/ANTISPASMODICS/ANTICHOLINERGICS* Plavix *HEMATOLOGICAL AGENTS - MISC.*  Medication History Malachi Bonds, CMA; 11/22/2017 9:00 AM) Atorvastatin Calcium (40MG  Tablet, Oral) Active. Clobetasol Propionate (0.05% Ointment, External) Active. Estradiol (0.1MG /GM Cream, Vaginal) Active. levETIRAcetam ER (500MG  Tablet ER 24HR, Oral) Active. Ezetimibe (10MG  Tablet, Oral) Active. Primidone (50MG  Tablet, Oral) Active. Synthroid (88MCG Tablet, Oral) Active. Sertraline HCl (50MG  Tablet, Oral) Active. lamoTRIgine (100MG  Tablet, Oral) Active. Zolpidem Tartrate (10MG  Tablet, Oral) Active. Anaprox (275MG  Tablet, Oral) Active. Keppra (500MG  Tablet, Oral) Active. Zetia (10MG  Tablet, Oral) Active. Depakote ER (500MG  Tablet ER 24HR, Oral) Active. Bentyl (10MG  Capsule, Oral) Active. KlonoPIN (0.5MG  Tablet, Oral) Active. Medications Reconciled    Vitals (Chemira Jones CMA; 11/22/2017 8:59 AM) 11/22/2017 8:58 AM Weight: 157.6 lb Height: 61in Body Surface Area: 1.71 m Body Mass Index: 29.78 kg/m  Temp.: 98.70F  Pulse: 77 (Regular)  BP: 122/78 (Sitting, Left Arm, Standard)      Physical Exam Alyssa Hector MD;  11/26/2017 11:40 AM)  General Mental Status-Alert. General Appearance-Not in acute distress, Not Sickly. Orientation-Oriented X3. Hydration-Well hydrated. Voice-Normal.  Integumentary Global Assessment Upon inspection  and palpation of skin surfaces of the - Axillae: non-tender, no inflammation or ulceration, no drainage. and Distribution of scalp and body hair is normal. General Characteristics Temperature - normal warmth is noted.  Head and Neck Head-normocephalic, atraumatic with no lesions or palpable masses. Face Global Assessment - atraumatic, no absence of expression. Neck Global Assessment - no abnormal movements, no bruit auscultated on the right, no bruit auscultated on the left, no decreased range of motion, non-tender. Trachea-midline. Thyroid Gland Characteristics - non-tender.  Eye Eyeball - Left-Extraocular movements intact, No Nystagmus. Eyeball - Right-Extraocular movements intact, No Nystagmus. Cornea - Left-No Hazy. Cornea - Right-No Hazy. Sclera/Conjunctiva - Left-No scleral icterus, No Discharge. Sclera/Conjunctiva - Right-No scleral icterus, No Discharge. Pupil - Left-Direct reaction to light normal. Pupil - Right-Direct reaction to light normal.  ENMT Ears Pinna - Left - no drainage observed, no generalized tenderness observed. Right - no drainage observed, no generalized tenderness observed. Nose and Sinuses External Inspection of the Nose - no destructive lesion observed. Inspection of the nares - Left - quiet respiration. Right - quiet respiration. Mouth and Throat Lips - Upper Lip - no fissures observed, no pallor noted. Lower Lip - no fissures observed, no pallor noted. Nasopharynx - no discharge present. Oral Cavity/Oropharynx - Tongue - no dryness observed. Oral Mucosa - no cyanosis observed. Hypopharynx - no evidence of airway distress observed.  Chest and Lung Exam Inspection Movements - Normal and Symmetrical.  Accessory muscles - No use of accessory muscles in breathing. Palpation Palpation of the chest reveals - Non-tender. Auscultation Breath sounds - Normal and Clear.  Cardiovascular Auscultation Rhythm - Regular. Murmurs & Other Heart Sounds - Auscultation of the heart reveals - No Murmurs and No Systolic Clicks.  Abdomen Inspection Inspection of the abdomen reveals - No Visible peristalsis and No Abnormal pulsations. Umbilicus - No Bleeding, No Urine drainage. Palpation/Percussion Palpation and Percussion of the abdomen reveal - Soft, Non Tender, No Rebound tenderness, No Rigidity (guarding) and No Cutaneous hyperesthesia. Note: Abdomen soft. Nontender. Not distended. No umbilical or incisional hernias. No guarding.  Rectal Note: No pruritus. Perianal skin clear. Normal sphincter tone. Pedunculated polyp mass felt at about 7 centimeters from the anal verge. A floppy mobile rectum but seems to coordinate with the left lateral wall. Involves about 20% of the rectal wall circumference  Peripheral Vascular Upper Extremity Inspection - Left - No Cyanotic nailbeds, Not Ischemic. Right - No Cyanotic nailbeds, Not Ischemic.  Neurologic Neurologic evaluation reveals -normal attention span and ability to concentrate, able to name objects and repeat phrases. Appropriate fund of knowledge , normal sensation and normal coordination. Mental Status Affect - not angry, not paranoid. Cranial Nerves-Normal Bilaterally. Gait-Normal. Note: Very subtle resting head tremor. Delayed reactivity of left pupil but eventually becomes equal and reactive  Neuropsychiatric Mental status exam performed with findings of-able to articulate well with normal speech/language, rate, volume and coherence, thought content normal with ability to perform basic computations and apply abstract reasoning and no evidence of hallucinations, delusions, obsessions or homicidal/suicidal  ideation.  Musculoskeletal Global Assessment Spine, Ribs and Pelvis - no instability, subluxation or laxity. Right Upper Extremity - no instability, subluxation or laxity.  Lymphatic Head & Neck  General Head & Neck Lymphatics: Bilateral - Description - No Localized lymphadenopathy. Axillary  General Axillary Region: Bilateral - Description - No Localized lymphadenopathy. Femoral & Inguinal  Generalized Femoral & Inguinal Lymphatics: Left - Description - No Localized lymphadenopathy. Right - Description - No Localized lymphadenopathy.  Assessment & Plan Alyssa Hector MD; 11/26/2017 11:41 AM)  ADENOMATOUS RECTAL POLYP (D12.8) Impression: Pedunculated mass in mid-rectum suspicious for unresectable polyp.  Think she would benefit from TEM partial proctectomy. Most likely left lateral positioning. Most likely staying overnight. Full bowel prep just in case.  She is interested in proceeding.  I understanding is that brain stimulator implants for her resting tremor will need to be turned off through [employer]. Patient I believe knows how to do this herself. Would like to get clearance from her neurology team to be sure.  Current Plans Pt Education - [facility] TEM Education (Deshaun Schou): discussed with patient and provided information.  PREOP COLON - ENCOUNTER FOR PREOPERATIVE EXAMINATION FOR GENERAL SURGICAL PROCEDURE (Z01.818)  Current Plans You are being scheduled for surgery- Our schedulers will call you.  You should hear from our office's scheduling department within 5 working days about the location, date, and time of surgery. We try to make accommodations for patient's preferences in scheduling surgery, but sometimes the OR schedule or the surgeon's schedule prevents [place] from making those accommodations.  If you have not heard from our office [phone]) in 5 working days, call the office and ask for your surgeon's nurse.  If you have other questions about your diagnosis,  plan, or surgery, call the office and ask for your surgeon's nurse.  Written instructions provided Pt Education - [facility] Colon Bowel Prep 2018 ERAS/Miralax/Antibiotics Started Neomycin Sulfate 500 MG Oral Tablet, 2 (two) Tablet SEE NOTE, #6, 11/22/2017, No Refill. Local Order: TAKE TWO TABLETS AT 2 PM, 3 PM, AND 10 PM THE DAY PRIOR TO SURGERY Started Flagyl 500 MG Oral Tablet, 2 (two) Tablet SEE NOTE, #6, 11/22/2017, No Refill. Local Order: Take at 2pm, 3pm, and 10pm the day prior to your colon operation  Alyssa Hector, MD, FACS, MASCRS Gastrointestinal and Minimally Invasive Surgery    [address], [place], [place] [address] [phone] Main / Paging [phone] Fax

## 2017-11-26 NOTE — H&P (Signed)
Rolland Porter Documented: 11/22/2017 8:58 AM Location: [facility] Patient #: [id number] DOB: Jun 20, 1965 Married / Language: Cleophus Molt / Race: White Female  History of Present Illness Adin Hector MD; 11/26/2017 11:40 AM) The patient is a 52 year old female who presents with a colorectal polyp. Note for "Colorectal polyp": ` ` ` Patient sent for surgical consultation at the request of Dr Dema Severin  Chief Complaint: Rectal polyp. Request for transanal removal ` ` ` The patient is a pleasant woman. History of hx of epilepsy, CVA (?thrombotic), deep brain stimulator for essential tremor. Has a neural stimulator. Followed by neurology in town as well as [facility]. Followed by gastroenterology. [facility]. Dr. Cristina Gong. On this, there was a large polyp found in the rectum which was biopsied and returned tubular villous adenoma. Additional polyps removed and came back as tubular adenomas. Biopsies were taken to evaluate for microscopic colitis. These were negative. She also had an EGD done which was otherwise unremarkable and biopsies of the small bowel demonstrated no findings consistent with celiac's. She has had 3 prior colonoscopies in the past and polyps removed with those. Felt too large to safely resect. Request for transanal excision made. Saw my younger partner. Felt that she would be a good candidate for that. Referred to me for consideration.  No personal nor family history of GI/colon cancer, inflammatory bowel disease, irritable bowel syndrome, allergy such as Celiac Sprue, dietary/dairy problems, colitis, ulcers nor gastritis. No recent sick contacts/gastroenteritis. No travel outside the country. No changes in diet. No dysphagia to solids or liquids. No significant heartburn or reflux. No hematochezia, hematemesis, coffee ground emesis. No evidence of prior gastric/peptic ulceration. She walk several miles without difficulty. Moves her bowels once or twice a  day.   IFO:YDXAJOIN (controlled on Depakote and Lamictal), CVA (? thrombotic - PCP is Dr. Carrolyn Meiers), hyperlipidemia (well controlled on statin), essential tremor (well controlled with DBS)  PSH: Open appendectomy Fissure-closure and hemorrhoidal banding, Dr. Harlow Asa Lap-assisted vaginal hysterectomy  FHx: Paternal grandmother had colon cancer Paternal grandfather had gastric cancer  Social: Denies use of tobacco/drugs; social EtOH. 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 Otherwise all other 12 point ROS are negative) ` ` ` `   Allergies (Chemira Jones, CMA; 11/22/2017 8:59 AM) penicillAMINE *Miscellaneous Therapeutic Classes** Protonix *ULCER DRUGS/ANTISPASMODICS/ANTICHOLINERGICS* Plavix *HEMATOLOGICAL AGENTS - MISC.*  Medication History Malachi Bonds, CMA; 11/22/2017 9:00 AM) Atorvastatin Calcium (40MG  Tablet, Oral) Active. Clobetasol Propionate (0.05% Ointment, External) Active. Estradiol (0.1MG /GM Cream, Vaginal) Active. levETIRAcetam ER (500MG  Tablet ER 24HR, Oral) Active. Ezetimibe (10MG  Tablet, Oral) Active. Primidone (50MG  Tablet, Oral) Active. Synthroid (88MCG Tablet, Oral) Active. Sertraline HCl (50MG  Tablet, Oral) Active. lamoTRIgine (100MG  Tablet, Oral) Active. Zolpidem Tartrate (10MG  Tablet, Oral) Active. Anaprox (275MG  Tablet, Oral) Active. Keppra (500MG  Tablet, Oral) Active. Zetia (10MG  Tablet, Oral) Active. Depakote ER (500MG  Tablet ER 24HR, Oral) Active. Bentyl (10MG  Capsule, Oral) Active. KlonoPIN (0.5MG  Tablet, Oral) Active. Medications Reconciled    Vitals (Chemira Jones CMA; 11/22/2017 8:59 AM) 11/22/2017 8:58 AM Weight: 157.6 lb Height: 61in Body Surface Area: 1.71 m Body Mass Index: 29.78 kg/m  Temp.: 98.46F  Pulse: 77 (Regular)  BP: 122/78 (Sitting, Left Arm, Standard)      Physical Exam Adin Hector MD;  11/26/2017 11:40 AM)  General Mental Status-Alert. General Appearance-Not in acute distress, Not Sickly. Orientation-Oriented X3. Hydration-Well hydrated. Voice-Normal.  Integumentary Global Assessment Upon inspection  and palpation of skin surfaces of the - Axillae: non-tender, no inflammation or ulceration, no drainage. and Distribution of scalp and body hair is normal. General Characteristics Temperature - normal warmth is noted.  Head and Neck Head-normocephalic, atraumatic with no lesions or palpable masses. Face Global Assessment - atraumatic, no absence of expression. Neck Global Assessment - no abnormal movements, no bruit auscultated on the right, no bruit auscultated on the left, no decreased range of motion, non-tender. Trachea-midline. Thyroid Gland Characteristics - non-tender.  Eye Eyeball - Left-Extraocular movements intact, No Nystagmus. Eyeball - Right-Extraocular movements intact, No Nystagmus. Cornea - Left-No Hazy. Cornea - Right-No Hazy. Sclera/Conjunctiva - Left-No scleral icterus, No Discharge. Sclera/Conjunctiva - Right-No scleral icterus, No Discharge. Pupil - Left-Direct reaction to light normal. Pupil - Right-Direct reaction to light normal.  ENMT Ears Pinna - Left - no drainage observed, no generalized tenderness observed. Right - no drainage observed, no generalized tenderness observed. Nose and Sinuses External Inspection of the Nose - no destructive lesion observed. Inspection of the nares - Left - quiet respiration. Right - quiet respiration. Mouth and Throat Lips - Upper Lip - no fissures observed, no pallor noted. Lower Lip - no fissures observed, no pallor noted. Nasopharynx - no discharge present. Oral Cavity/Oropharynx - Tongue - no dryness observed. Oral Mucosa - no cyanosis observed. Hypopharynx - no evidence of airway distress observed.  Chest and Lung Exam Inspection Movements - Normal and Symmetrical.  Accessory muscles - No use of accessory muscles in breathing. Palpation Palpation of the chest reveals - Non-tender. Auscultation Breath sounds - Normal and Clear.  Cardiovascular Auscultation Rhythm - Regular. Murmurs & Other Heart Sounds - Auscultation of the heart reveals - No Murmurs and No Systolic Clicks.  Abdomen Inspection Inspection of the abdomen reveals - No Visible peristalsis and No Abnormal pulsations. Umbilicus - No Bleeding, No Urine drainage. Palpation/Percussion Palpation and Percussion of the abdomen reveal - Soft, Non Tender, No Rebound tenderness, No Rigidity (guarding) and No Cutaneous hyperesthesia. Note: Abdomen soft. Nontender. Not distended. No umbilical or incisional hernias. No guarding.  Rectal Note: No pruritus. Perianal skin clear. Normal sphincter tone. Pedunculated polyp mass felt at about 7 centimeters from the anal verge. A floppy mobile rectum but seems to coordinate with the left lateral wall. Involves about 20% of the rectal wall circumference  Peripheral Vascular Upper Extremity Inspection - Left - No Cyanotic nailbeds, Not Ischemic. Right - No Cyanotic nailbeds, Not Ischemic.  Neurologic Neurologic evaluation reveals -normal attention span and ability to concentrate, able to name objects and repeat phrases. Appropriate fund of knowledge , normal sensation and normal coordination. Mental Status Affect - not angry, not paranoid. Cranial Nerves-Normal Bilaterally. Gait-Normal. Note: Very subtle resting head tremor. Delayed reactivity of left pupil but eventually becomes equal and reactive  Neuropsychiatric Mental status exam performed with findings of-able to articulate well with normal speech/language, rate, volume and coherence, thought content normal with ability to perform basic computations and apply abstract reasoning and no evidence of hallucinations, delusions, obsessions or homicidal/suicidal  ideation.  Musculoskeletal Global Assessment Spine, Ribs and Pelvis - no instability, subluxation or laxity. Right Upper Extremity - no instability, subluxation or laxity.  Lymphatic Head & Neck  General Head & Neck Lymphatics: Bilateral - Description - No Localized lymphadenopathy. Axillary  General Axillary Region: Bilateral - Description - No Localized lymphadenopathy. Femoral & Inguinal  Generalized Femoral & Inguinal Lymphatics: Left - Description - No Localized lymphadenopathy. Right - Description - No Localized lymphadenopathy.  Assessment & Plan Adin Hector MD; 11/26/2017 11:41 AM)  ADENOMATOUS RECTAL POLYP (D12.8) Impression: Pedunculated mass in mid-rectum suspicious for unresectable polyp.  Think she would benefit from TEM partial proctectomy. Most likely left lateral positioning. Most likely staying overnight. Full bowel prep just in case.  She is interested in proceeding.  I understanding is that brain stimulator implants for her resting tremor will need to be turned off through [employer]. Patient I believe knows how to do this herself. Would like to get clearance from her neurology team to be sure.  Current Plans Pt Education - [facility] TEM Education (Weltha Cathy): discussed with patient and provided information.  PREOP COLON - ENCOUNTER FOR PREOPERATIVE EXAMINATION FOR GENERAL SURGICAL PROCEDURE (Z01.818)  Current Plans You are being scheduled for surgery- Our schedulers will call you.  You should hear from our office's scheduling department within 5 working days about the location, date, and time of surgery. We try to make accommodations for patient's preferences in scheduling surgery, but sometimes the OR schedule or the surgeon's schedule prevents [place] from making those accommodations.  If you have not heard from our office [phone]) in 5 working days, call the office and ask for your surgeon's nurse.  If you have other questions about your diagnosis,  plan, or surgery, call the office and ask for your surgeon's nurse.  Written instructions provided Pt Education - [facility] Colon Bowel Prep 2018 ERAS/Miralax/Antibiotics Started Neomycin Sulfate 500 MG Oral Tablet, 2 (two) Tablet SEE NOTE, #6, 11/22/2017, No Refill. Local Order: TAKE TWO TABLETS AT 2 PM, 3 PM, AND 10 PM THE DAY PRIOR TO SURGERY Started Flagyl 500 MG Oral Tablet, 2 (two) Tablet SEE NOTE, #6, 11/22/2017, No Refill. Local Order: Take at 2pm, 3pm, and 10pm the day prior to your colon operation  Adin Hector, MD, FACS, MASCRS Gastrointestinal and Minimally Invasive Surgery    [address], [place], [place] [address] [phone] Main / Paging [phone] Fax

## 2017-11-28 MED FILL — PRIMIDONE 50 MG TABLET: 50 | 30 days supply | Qty: 150 | Fill #0

## 2017-11-29 ENCOUNTER — Encounter (HOSPITAL_COMMUNITY)
Admission: RE | Admit: 2017-11-29 | Discharge: 2017-11-29 | Disposition: A | Discharge status: Home / Self Care | Payer: 59 | Source: Ambulatory Visit | Attending: Surgery | Admitting: Surgery

## 2017-11-29 ENCOUNTER — Other Ambulatory Visit: Payer: Self-pay

## 2017-11-29 ENCOUNTER — Encounter (HOSPITAL_COMMUNITY): Payer: Self-pay

## 2017-11-29 DIAGNOSIS — Z01812 Encounter for preprocedural laboratory examination: Secondary | ICD-10-CM | POA: Diagnosis not present

## 2017-11-29 HISTORY — DX: Other complications of anesthesia, initial encounter: T88.59XA

## 2017-11-29 HISTORY — DX: Other specified postprocedural states: R11.2

## 2017-11-29 HISTORY — DX: Adverse effect of unspecified anesthetic, initial encounter: T41.45XA

## 2017-11-29 HISTORY — DX: Hypothyroidism, unspecified: E03.9

## 2017-11-29 HISTORY — DX: Other specified postprocedural states: Z98.890

## 2017-11-29 LAB — BASIC METABOLIC PANEL
Anion gap: 7 (ref 5–15)
BUN: 13 mg/dL (ref 6–20)
CALCIUM: 9.8 mg/dL (ref 8.9–10.3)
CO2: 31 mmol/L (ref 22–32)
CREATININE: 0.72 mg/dL (ref 0.44–1.00)
Chloride: 104 mmol/L (ref 98–111)
GFR calc Af Amer: >60 mL/min (ref >60)
GLUCOSE: 94 mg/dL (ref 70–99)
Potassium: 4.4 mmol/L (ref 3.5–5.1)
Sodium: 142 mmol/L (ref 135–145)

## 2017-11-29 LAB — CBC
HCT: 41.7 % (ref 36.0–46.0)
Hemoglobin: 13.6 g/dL (ref 12.0–15.0)
MCH: 32 pg (ref 26.0–34.0)
MCHC: 32.6 g/dL (ref 30.0–36.0)
MCV: 98.1 fL (ref 80.0–100.0)
PLATELETS: 159 10*3/uL (ref 150–400)
RBC: 4.25 MIL/uL (ref 3.87–5.11)
RDW: 12.9 % (ref 11.5–15.5)
WBC: 3.4 10*3/uL — ABNORMAL LOW (ref 4.0–10.5)
nRBC: 0 % (ref 0.0–0.2)

## 2017-11-29 LAB — HEMOGLOBIN A1C
HEMOGLOBIN A1C: 4.6 % — AB (ref 4.8–5.6)
Mean Plasma Glucose: 85.32 mg/dL

## 2017-11-29 NOTE — Patient Instructions (Addendum)
Alyssa Mccarthy  11/29/2017   Your procedure is scheduled on: Tuesday 12/04/2017  Report to [facility] Main  Entrance              Report to admitting at   1100  AM    Call this number if you have problems the morning of surgery [phone]               Follow Bowel prep instructions from Dr. Johney [place] the day before surgery with a clear liquid diet!   Remember: Do not eat food :After Midnight on Sunday 12/02/2017!   DRINK 2 PRESURGERY ENSURE DRINKS THE NIGHT BEFORE SURGERY AT  1000 PM AND 1 PRESURGERY DRINK THE DAY OF THE PROCEDURE 3 HOURS PRIOR TO SCHEDULED SURGERY. NO SOLIDS AFTER MIDNIGHT THE DAY PRIOR TO THE SURGERY. NOTHING BY MOUTH EXCEPT CLEAR LIQUIDS UNTIL THREE HOURS PRIOR TO SCHEDULED SURGERY. PLEASE FINISH PRESURGERY ENSURE DRINK PER SURGEON ORDER 3 HOURS PRIOR TO SCHEDULED SURGERY TIME WHICH NEEDS TO BE COMPLETED AT  1000 am.    CLEAR LIQUID DIET   Foods Allowed                                                                     Foods Excluded  Coffee and tea, regular and decaf                             liquids that you cannot  Plain Jell-O in any flavor                                             see through such as: Fruit ices (not with fruit pulp)                                     milk, soups, orange juice  Iced Popsicles                                    All solid food Carbonated beverages, regular and diet                                    Cranberry, grape and apple juices Sports drinks like Gatorade Lightly seasoned clear broth or consume(fat free) Sugar, honey syrup  Sample Menu Breakfast                                Lunch                                     Supper Cranberry juice  Beef broth                            Chicken broth Jell-O                                     Grape juice                           Apple juice Coffee or tea                        Jell-O                                       Popsicle                                                Coffee or tea                        Coffee or tea  _____________________________________________________________________               BRUSH YOUR TEETH MORNING OF SURGERY AND RINSE YOUR MOUTH OUT, NO CHEWING GUM CANDY OR MINTS.     Take these medicines the morning of surgery with A SIP OF WATER: Lamotrigine (Lamictal), Levetiracetam (Keppra), Levothyroxine (Synthroid), Sertraline (Zoloft), Primidone (Mysoline)                                  You may not have any metal on your body including hair pins and              piercings  Do not wear jewelry, make-up, lotions, powders or perfumes, deodorant             Do not wear nail polish.  Do not shave  48 hours prior to surgery.              Men may shave face and neck.   Do not bring valuables to the hospital. [name].  Contacts, dentures or bridgework may not be worn into surgery.  Leave suitcase in the car. After surgery it may be brought to your room.                   Please read over the following fact sheets you were given: _____________________________________________________________________             [facility] - Preparing for Surgery Before surgery, you can play an important role.  Because skin is not sterile, your skin needs to be as free of germs as possible.  You can reduce the number of germs on your skin by washing with CHG (chlorahexidine gluconate) soap before surgery.  CHG is an antiseptic cleaner which kills germs and bonds with the skin to continue killing germs even after washing. Please DO NOT use if you have an allergy to CHG or antibacterial soaps.  If your skin becomes reddened/irritated stop using the CHG and inform your nurse when you arrive at Short Stay. Do not shave (including legs and underarms) for at least 48 hours prior to the first CHG shower.  You may shave your face/neck. Please follow  these instructions carefully:  1.  Shower with CHG Soap the night before surgery and the  morning of Surgery.  2.  If you choose to wash your hair, wash your hair first as usual with your  normal  shampoo.  3.  After you shampoo, rinse your hair and body thoroughly to remove the  shampoo.                           4.  Use CHG as you would any other liquid soap.  You can apply chg directly  to the skin and wash                       Gently with a scrungie or clean washcloth.  5.  Apply the CHG Soap to your body ONLY FROM THE NECK DOWN.   Do not use on face/ open                           Wound or open sores. Avoid contact with eyes, ears mouth and genitals (private parts).                       Wash face,  Genitals (private parts) with your normal soap.             6.  Wash thoroughly, paying special attention to the area where your surgery  will be performed.  7.  Thoroughly rinse your body with warm water from the neck down.  8.  DO NOT shower/wash with your normal soap after using and rinsing off  the CHG Soap.                9.  Pat yourself dry with a clean towel.            10.  Wear clean pajamas.            11.  Place clean sheets on your bed the night of your first shower and do not  sleep with pets. Day of Surgery : Do not apply any lotions/deodorants the morning of surgery.  Please wear clean clothes to the hospital/surgery center.  FAILURE TO FOLLOW THESE INSTRUCTIONS MAY RESULT IN THE CANCELLATION OF YOUR SURGERY PATIENT SIGNATURE_________________________________  NURSE SIGNATURE__________________________________  ________________________________________________________________________   [name]  An incentive spirometer is a tool that can help keep your lungs clear and active. This tool measures how well you are filling your lungs with each breath. Taking long deep breaths may help reverse or decrease the chance of developing breathing (pulmonary) problems  (especially infection) following:  A long period of time when you are unable to move or be active. BEFORE THE PROCEDURE   If the spirometer includes an indicator to show your best effort, your nurse or respiratory therapist will set it to a desired goal.  If possible, sit up straight or lean slightly forward. Try not to slouch.  Hold the incentive spirometer in an upright position. INSTRUCTIONS FOR USE  1. Sit on the edge of your bed if possible, or sit up as far as you can in bed or on  a chair. 2. Hold the incentive spirometer in an upright position. 3. Breathe out normally. 4. Place the mouthpiece in your mouth and seal your lips tightly around it. 5. Breathe in slowly and as deeply as possible, raising the piston or the ball toward the top of the column. 6. Hold your breath for 3-5 seconds or for as long as possible. Allow the piston or ball to fall to the bottom of the column. 7. Remove the mouthpiece from your mouth and breathe out normally. 8. Rest for a few seconds and repeat Steps 1 through 7 at least 10 times every 1-2 hours when you are awake. Take your time and take a few normal breaths between deep breaths. 9. The spirometer may include an indicator to show your best effort. Use the indicator as a goal to work toward during each repetition. 10. After each set of 10 deep breaths, practice coughing to be sure your lungs are clear. If you have an incision (the cut made at the time of surgery), support your incision when coughing by placing a pillow or rolled up towels firmly against it. Once you are able to get out of bed, walk around indoors and cough well. You may stop using the incentive spirometer when instructed by your caregiver.  RISKS AND COMPLICATIONS  Take your time so you do not get dizzy or light-headed.  If you are in pain, you may need to take or ask for pain medication before doing incentive spirometry. It is harder to take a deep breath if you are having  pain. AFTER USE  Rest and breathe slowly and easily.  It can be helpful to keep track of a log of your progress. Your caregiver can provide you with a simple table to help with this. If you are using the spirometer at home, follow these instructions: [place] IF:   You are having difficultly using the spirometer.  You have trouble using the spirometer as often as instructed.  Your pain medication is not giving enough relief while using the spirometer.  You develop fever of 100.5 F (38.1 C) or higher. SEEK IMMEDIATE MEDICAL CARE IF:   You cough up bloody sputum that had not been present before.  You develop fever of 102 F (38.9 C) or greater.  You develop worsening pain at or near the incision site. MAKE SURE YOU:   Understand these instructions.  Will watch your condition.  Will get help right away if you are not doing well or get worse. Document Released: 05/22/2006 Document Revised: 04/03/2011 Document Reviewed: 07/23/2006 [facility] Patient Information 2014 [place], [place].   ________________________________________________________________________

## 2017-11-30 MED FILL — PROPRANOLOL ER 60 MG CAP: 60 | 30 days supply | Qty: 30 | Fill #0

## 2017-12-03 MED ORDER — BUPIVACAINE LIPOSOME 1.3 % IJ SUSP
20.0000 mL | Freq: Once | INTRAMUSCULAR | Status: DC
  Filled 2017-12-03: qty 20

## 2017-12-03 MED ORDER — GENTAMICIN SULFATE 40 MG/ML IJ SOLN
5.0000 mg/kg | INTRAVENOUS | Status: AC
  Administered 2017-12-04: 291.6 mg via INTRAVENOUS
  Filled 2017-12-03: qty 7.25

## 2017-12-03 MED ORDER — CLINDAMYCIN PHOSPHATE 900 MG/50ML IV SOLN
900.0000 mg | INTRAVENOUS | Status: AC
  Administered 2017-12-04: 900 mg via INTRAVENOUS

## 2017-12-04 ENCOUNTER — Other Ambulatory Visit: Payer: Self-pay

## 2017-12-04 ENCOUNTER — Ambulatory Visit (HOSPITAL_COMMUNITY): Payer: 59 | Admitting: Anesthesiology

## 2017-12-04 ENCOUNTER — Encounter (HOSPITAL_COMMUNITY): Payer: Self-pay | Admitting: General Practice

## 2017-12-04 ENCOUNTER — Encounter (HOSPITAL_COMMUNITY)
Admission: AD | Disposition: A | Discharge status: Home / Self Care | Payer: Self-pay | Source: Ambulatory Visit | Attending: Surgery

## 2017-12-04 ENCOUNTER — Inpatient Hospital Stay (HOSPITAL_COMMUNITY)
Admission: AD | Admit: 2017-12-04 | Discharge: 2017-12-06 | DRG: 334 | Disposition: A | Discharge status: Home / Self Care | Payer: 59 | Source: Ambulatory Visit | Attending: Surgery | Admitting: Surgery

## 2017-12-04 DIAGNOSIS — E785 Hyperlipidemia, unspecified: Secondary | ICD-10-CM | POA: Diagnosis not present

## 2017-12-04 DIAGNOSIS — Z8673 Personal history of transient ischemic attack (TIA), and cerebral infarction without residual deficits: Secondary | ICD-10-CM

## 2017-12-04 DIAGNOSIS — Z96659 Presence of unspecified artificial knee joint: Secondary | ICD-10-CM | POA: Diagnosis not present

## 2017-12-04 DIAGNOSIS — E039 Hypothyroidism, unspecified: Secondary | ICD-10-CM | POA: Diagnosis present

## 2017-12-04 DIAGNOSIS — Z800 Family history of malignant neoplasm of digestive organs: Secondary | ICD-10-CM

## 2017-12-04 DIAGNOSIS — Z79899 Other long term (current) drug therapy: Secondary | ICD-10-CM

## 2017-12-04 DIAGNOSIS — D128 Benign neoplasm of rectum: Secondary | ICD-10-CM

## 2017-12-04 DIAGNOSIS — Z79818 Long term (current) use of other agents affecting estrogen receptors and estrogen levels: Secondary | ICD-10-CM

## 2017-12-04 DIAGNOSIS — Z888 Allergy status to other drugs, medicaments and biological substances status: Secondary | ICD-10-CM

## 2017-12-04 DIAGNOSIS — K621 Rectal polyp: Secondary | ICD-10-CM | POA: Diagnosis not present

## 2017-12-04 DIAGNOSIS — G40909 Epilepsy, unspecified, not intractable, without status epilepticus: Secondary | ICD-10-CM | POA: Diagnosis present

## 2017-12-04 DIAGNOSIS — Z880 Allergy status to penicillin: Secondary | ICD-10-CM

## 2017-12-04 DIAGNOSIS — D375 Neoplasm of uncertain behavior of rectum: Secondary | ICD-10-CM | POA: Diagnosis not present

## 2017-12-04 DIAGNOSIS — E876 Hypokalemia: Secondary | ICD-10-CM | POA: Diagnosis present

## 2017-12-04 DIAGNOSIS — G250 Essential tremor: Secondary | ICD-10-CM | POA: Diagnosis present

## 2017-12-04 DIAGNOSIS — Z7982 Long term (current) use of aspirin: Secondary | ICD-10-CM

## 2017-12-04 HISTORY — PX: PARTIAL PROCTECTOMY BY TEM: SHX6011

## 2017-12-04 SURGERY — PARTIAL PROCTECTOMY BY TEM
Anesthesia: General

## 2017-12-04 MED ORDER — LACTATED RINGERS IV SOLN
INTRAVENOUS | Status: DC
  Administered 2017-12-04: 20:00:00 via INTRAVENOUS

## 2017-12-04 MED ORDER — BISACODYL 5 MG PO TBEC
20.0000 mg | DELAYED_RELEASE_TABLET | Freq: Once | ORAL | Status: DC
  Filled 2017-12-04: qty 4

## 2017-12-04 MED ORDER — FENTANYL CITRATE (PF) 100 MCG/2ML IJ SOLN: 25.0000 ug | INTRAMUSCULAR | Status: DC | PRN

## 2017-12-04 MED ORDER — ACETAMINOPHEN 500 MG PO TABS
1000.0000 mg | ORAL_TABLET | ORAL | Status: AC
  Administered 2017-12-04: 1000 mg via ORAL
  Filled 2017-12-04: qty 2

## 2017-12-04 MED ORDER — GABAPENTIN 300 MG PO CAPS
300.0000 mg | ORAL_CAPSULE | Freq: Two times a day (BID) | ORAL | Status: DC
  Administered 2017-12-04 – 2017-12-05 (×3): 300 mg via ORAL
  Filled 2017-12-04 (×3): qty 1

## 2017-12-04 MED ORDER — MIDAZOLAM HCL 5 MG/5ML IJ SOLN
INTRAMUSCULAR | Status: DC | PRN
  Administered 2017-12-04: 2 mg via INTRAVENOUS

## 2017-12-04 MED ORDER — DIBUCAINE 1 % RE OINT
TOPICAL_OINTMENT | RECTAL | Status: AC
  Filled 2017-12-04: qty 28

## 2017-12-04 MED ORDER — LACTATED RINGERS IV SOLN
INTRAVENOUS | Status: DC
  Administered 2017-12-04 (×2): via INTRAVENOUS

## 2017-12-04 MED ORDER — CHLORHEXIDINE GLUCONATE CLOTH 2 % EX PADS: 6.0000 | MEDICATED_PAD | Freq: Once | CUTANEOUS | Status: DC

## 2017-12-04 MED ORDER — ENOXAPARIN SODIUM 40 MG/0.4ML ~~LOC~~ SOLN
40.0000 mg | SUBCUTANEOUS | Status: DC
  Administered 2017-12-05 – 2017-12-06 (×2): 40 mg via SUBCUTANEOUS
  Filled 2017-12-04 (×2): qty 0.4

## 2017-12-04 MED ORDER — REMIFENTANIL HCL 1 MG IV SOLR
INTRAVENOUS | Status: DC | PRN
  Administered 2017-12-04: .25 ug/kg/min via INTRAVENOUS

## 2017-12-04 MED ORDER — EPHEDRINE SULFATE-NACL 50-0.9 MG/10ML-% IV SOSY
PREFILLED_SYRINGE | INTRAVENOUS | Status: DC | PRN
  Administered 2017-12-04: 5 mg via INTRAVENOUS

## 2017-12-04 MED ORDER — PROCHLORPERAZINE MALEATE 10 MG PO TABS: 10.0000 mg | ORAL_TABLET | Freq: Four times a day (QID) | ORAL | Status: DC | PRN

## 2017-12-04 MED ORDER — LEVOTHYROXINE SODIUM 88 MCG PO TABS
88.0000 ug | ORAL_TABLET | Freq: Every day | ORAL | Status: DC
  Administered 2017-12-05 – 2017-12-06 (×2): 88 ug via ORAL
  Filled 2017-12-04 (×2): qty 1

## 2017-12-04 MED ORDER — CLONAZEPAM 0.5 MG PO TABS: 0.5000 mg | ORAL_TABLET | Freq: Two times a day (BID) | ORAL | Status: DC | PRN

## 2017-12-04 MED ORDER — BUPIVACAINE-EPINEPHRINE 0.25% -1:200000 IJ SOLN
INTRAMUSCULAR | Status: DC | PRN
  Administered 2017-12-04: 20 mL

## 2017-12-04 MED ORDER — PROPOFOL 10 MG/ML IV BOLUS
INTRAVENOUS | Status: AC
  Filled 2017-12-04: qty 20

## 2017-12-04 MED ORDER — FENTANYL CITRATE (PF) 100 MCG/2ML IJ SOLN
INTRAMUSCULAR | Status: DC | PRN
  Administered 2017-12-04: 50 ug via INTRAVENOUS

## 2017-12-04 MED ORDER — ONDANSETRON HCL 4 MG/2ML IJ SOLN: 4.0000 mg | Freq: Four times a day (QID) | INTRAMUSCULAR | Status: DC | PRN

## 2017-12-04 MED ORDER — SUGAMMADEX SODIUM 200 MG/2ML IV SOLN
INTRAVENOUS | Status: AC
  Filled 2017-12-04: qty 2

## 2017-12-04 MED ORDER — ASPIRIN EC 325 MG PO TBEC
325.0000 mg | DELAYED_RELEASE_TABLET | Freq: Every day | ORAL | Status: DC
  Administered 2017-12-04 – 2017-12-05 (×2): 325 mg via ORAL
  Filled 2017-12-04 (×2): qty 1

## 2017-12-04 MED ORDER — KETAMINE HCL 10 MG/ML IJ SOLN
INTRAMUSCULAR | Status: AC
  Filled 2017-12-04: qty 1

## 2017-12-04 MED ORDER — ALVIMOPAN 12 MG PO CAPS
12.0000 mg | ORAL_CAPSULE | ORAL | Status: AC
  Administered 2017-12-04: 12 mg via ORAL
  Filled 2017-12-04: qty 1

## 2017-12-04 MED ORDER — BUPIVACAINE LIPOSOME 1.3 % IJ SUSP
INTRAMUSCULAR | Status: DC | PRN
  Administered 2017-12-04: 20 mL

## 2017-12-04 MED ORDER — ATORVASTATIN CALCIUM 40 MG PO TABS
40.0000 mg | ORAL_TABLET | Freq: Every evening | ORAL | Status: DC
  Administered 2017-12-04 – 2017-12-05 (×2): 40 mg via ORAL
  Filled 2017-12-04 (×2): qty 1

## 2017-12-04 MED ORDER — 0.9 % SODIUM CHLORIDE (POUR BTL) OPTIME
TOPICAL | Status: DC | PRN
  Administered 2017-12-04: 1000 mL

## 2017-12-04 MED ORDER — LEVETIRACETAM ER 500 MG PO TB24
2500.0000 mg | ORAL_TABLET | Freq: Every day | ORAL | Status: DC
  Administered 2017-12-05: 2500 mg via ORAL
  Filled 2017-12-04 (×2): qty 5

## 2017-12-04 MED ORDER — OXYCODONE HCL 5 MG/5ML PO SOLN
5.0000 mg | Freq: Once | ORAL | Status: DC | PRN
  Filled 2017-12-04: qty 5

## 2017-12-04 MED ORDER — SODIUM CHLORIDE 0.9 % IV SOLN
INTRAVENOUS | Status: DC
  Filled 2017-12-04 (×2): qty 6

## 2017-12-04 MED ORDER — BUPIVACAINE-EPINEPHRINE (PF) 0.25% -1:200000 IJ SOLN
INTRAMUSCULAR | Status: AC
  Filled 2017-12-04: qty 60

## 2017-12-04 MED ORDER — NEOMYCIN SULFATE 500 MG PO TABS
1000.0000 mg | ORAL_TABLET | ORAL | Status: DC
  Filled 2017-12-04: qty 2

## 2017-12-04 MED ORDER — SERTRALINE HCL 50 MG PO TABS
50.0000 mg | ORAL_TABLET | Freq: Every day | ORAL | Status: DC
  Administered 2017-12-04 – 2017-12-05 (×2): 50 mg via ORAL
  Filled 2017-12-04 (×2): qty 1

## 2017-12-04 MED ORDER — CLINDAMYCIN PHOSPHATE 900 MG/50ML IV SOLN
900.0000 mg | Freq: Three times a day (TID) | INTRAVENOUS | Status: AC
  Administered 2017-12-04: 900 mg via INTRAVENOUS
  Filled 2017-12-04: qty 50

## 2017-12-04 MED ORDER — TRAMADOL HCL 50 MG PO TABS: 50.0000 mg | ORAL_TABLET | Freq: Four times a day (QID) | ORAL | 0 refills | Status: DC | PRN

## 2017-12-04 MED ORDER — DIBUCAINE 1 % RE OINT
TOPICAL_OINTMENT | RECTAL | Status: DC | PRN
  Administered 2017-12-04: 1 via RECTAL

## 2017-12-04 MED ORDER — PROPOFOL 10 MG/ML IV BOLUS
INTRAVENOUS | Status: DC | PRN
  Administered 2017-12-04: 150 mg via INTRAVENOUS

## 2017-12-04 MED ORDER — REMIFENTANIL HCL 1 MG IV SOLR
INTRAVENOUS | Status: AC
  Filled 2017-12-04: qty 1000

## 2017-12-04 MED ORDER — SACCHAROMYCES BOULARDII 250 MG PO CAPS
250.0000 mg | ORAL_CAPSULE | Freq: Two times a day (BID) | ORAL | Status: DC
  Administered 2017-12-04 – 2017-12-05 (×3): 250 mg via ORAL
  Filled 2017-12-04 (×3): qty 1

## 2017-12-04 MED ORDER — HYDRALAZINE HCL 20 MG/ML IJ SOLN: 10.0000 mg | INTRAMUSCULAR | Status: DC | PRN

## 2017-12-04 MED ORDER — BISMUTH SUBSALICYLATE 262 MG/15ML PO SUSP
30.0000 mL | Freq: Three times a day (TID) | ORAL | Status: DC | PRN
  Filled 2017-12-04: qty 236

## 2017-12-04 MED ORDER — DIPHENHYDRAMINE HCL 50 MG/ML IJ SOLN: 12.5000 mg | Freq: Four times a day (QID) | INTRAMUSCULAR | Status: DC | PRN

## 2017-12-04 MED ORDER — PROPOFOL 1000 MG/100ML IV EMUL
5.0000 ug/kg/min | INTRAVENOUS | Status: DC
  Administered 2017-12-04: 30 ug/kg/min via INTRAVENOUS
  Filled 2017-12-04: qty 100

## 2017-12-04 MED ORDER — LAMOTRIGINE 100 MG PO TABS
100.0000 mg | ORAL_TABLET | Freq: Two times a day (BID) | ORAL | Status: DC
  Administered 2017-12-04 – 2017-12-05 (×3): 100 mg via ORAL
  Filled 2017-12-04 (×3): qty 1

## 2017-12-04 MED ORDER — OXYCODONE HCL 5 MG PO TABS: 5.0000 mg | ORAL_TABLET | Freq: Once | ORAL | Status: DC | PRN

## 2017-12-04 MED ORDER — ONDANSETRON HCL 4 MG/2ML IJ SOLN: 4.0000 mg | Freq: Once | INTRAMUSCULAR | Status: DC | PRN

## 2017-12-04 MED ORDER — PRIMIDONE 50 MG PO TABS
100.0000 mg | ORAL_TABLET | Freq: Every day | ORAL | Status: DC
  Administered 2017-12-05: 100 mg via ORAL
  Filled 2017-12-04 (×2): qty 2

## 2017-12-04 MED ORDER — EZETIMIBE 10 MG PO TABS
10.0000 mg | ORAL_TABLET | Freq: Every day | ORAL | Status: DC
  Administered 2017-12-04 – 2017-12-05 (×2): 10 mg via ORAL
  Filled 2017-12-04 (×2): qty 1

## 2017-12-04 MED ORDER — SERTRALINE HCL 50 MG PO TABS
100.0000 mg | ORAL_TABLET | Freq: Every day | ORAL | Status: DC
  Administered 2017-12-05: 100 mg via ORAL
  Filled 2017-12-04: qty 2

## 2017-12-04 MED ORDER — HYDROMORPHONE HCL 1 MG/ML IJ SOLN
0.5000 mg | INTRAMUSCULAR | Status: DC | PRN
  Administered 2017-12-05: 1 mg via INTRAVENOUS
  Filled 2017-12-04: qty 1

## 2017-12-04 MED ORDER — ACETAMINOPHEN 500 MG PO TABS
1000.0000 mg | ORAL_TABLET | Freq: Four times a day (QID) | ORAL | Status: DC
  Administered 2017-12-04 – 2017-12-06 (×7): 1000 mg via ORAL
  Filled 2017-12-04 (×7): qty 2

## 2017-12-04 MED ORDER — SUGAMMADEX SODIUM 200 MG/2ML IV SOLN
150.0000 mg | Freq: Once | INTRAVENOUS | Status: AC
  Administered 2017-12-04: 150 mg via INTRAVENOUS

## 2017-12-04 MED ORDER — PRIMIDONE 50 MG PO TABS
150.0000 mg | ORAL_TABLET | Freq: Every day | ORAL | Status: DC
  Administered 2017-12-04 – 2017-12-05 (×2): 150 mg via ORAL
  Filled 2017-12-04 (×2): qty 3

## 2017-12-04 MED ORDER — PROCHLORPERAZINE EDISYLATE 10 MG/2ML IJ SOLN: 5.0000 mg | Freq: Four times a day (QID) | INTRAMUSCULAR | Status: DC | PRN

## 2017-12-04 MED ORDER — PSYLLIUM 95 % PO PACK
1.0000 | PACK | Freq: Every day | ORAL | Status: DC
  Administered 2017-12-04 – 2017-12-05 (×2): 1 via ORAL
  Filled 2017-12-04 (×2): qty 1

## 2017-12-04 MED ORDER — DIVALPROEX SODIUM ER 500 MG PO TB24
500.0000 mg | ORAL_TABLET | Freq: Every evening | ORAL | Status: DC
  Administered 2017-12-04 – 2017-12-05 (×2): 500 mg via ORAL
  Filled 2017-12-04 (×2): qty 1

## 2017-12-04 MED ORDER — ALVIMOPAN 12 MG PO CAPS
12.0000 mg | ORAL_CAPSULE | Freq: Two times a day (BID) | ORAL | Status: DC
  Administered 2017-12-05 (×2): 12 mg via ORAL
  Filled 2017-12-04 (×2): qty 1

## 2017-12-04 MED ORDER — ONDANSETRON HCL 4 MG PO TABS: 4.0000 mg | ORAL_TABLET | Freq: Four times a day (QID) | ORAL | Status: DC | PRN

## 2017-12-04 MED ORDER — LIP MEDEX EX OINT
1.0000 "application " | TOPICAL_OINTMENT | Freq: Two times a day (BID) | CUTANEOUS | Status: DC
  Administered 2017-12-04 – 2017-12-05 (×2): 1 via TOPICAL
  Filled 2017-12-04 (×2): qty 7

## 2017-12-04 MED ORDER — ZOLPIDEM TARTRATE 5 MG PO TABS
5.0000 mg | ORAL_TABLET | Freq: Every day | ORAL | Status: DC
  Administered 2017-12-04 – 2017-12-05 (×2): 5 mg via ORAL
  Filled 2017-12-04 (×2): qty 1

## 2017-12-04 MED ORDER — ENOXAPARIN SODIUM 40 MG/0.4ML ~~LOC~~ SOLN
40.0000 mg | Freq: Once | SUBCUTANEOUS | Status: AC
  Administered 2017-12-04: 40 mg via SUBCUTANEOUS
  Filled 2017-12-04: qty 0.4

## 2017-12-04 MED ORDER — CLINDAMYCIN PHOSPHATE 900 MG/50ML IV SOLN
INTRAVENOUS | Status: AC
  Filled 2017-12-04: qty 50

## 2017-12-04 MED ORDER — MIDAZOLAM HCL 2 MG/2ML IJ SOLN
INTRAMUSCULAR | Status: AC
  Filled 2017-12-04: qty 2

## 2017-12-04 MED ORDER — METOPROLOL TARTRATE 5 MG/5ML IV SOLN: 5.0000 mg | Freq: Four times a day (QID) | INTRAVENOUS | Status: DC | PRN

## 2017-12-04 MED ORDER — FENTANYL CITRATE (PF) 100 MCG/2ML IJ SOLN
INTRAMUSCULAR | Status: AC
  Filled 2017-12-04: qty 2

## 2017-12-04 MED ORDER — MAGIC MOUTHWASH
15.0000 mL | Freq: Four times a day (QID) | ORAL | Status: DC | PRN
  Filled 2017-12-04: qty 15

## 2017-12-04 MED ORDER — LACTATED RINGERS IV SOLN
INTRAVENOUS | Status: DC | PRN
  Administered 2017-12-04: 14:00:00 via INTRAVENOUS

## 2017-12-04 MED ORDER — GABAPENTIN 300 MG PO CAPS
300.0000 mg | ORAL_CAPSULE | ORAL | Status: AC
  Administered 2017-12-04: 300 mg via ORAL
  Filled 2017-12-04: qty 1

## 2017-12-04 MED ORDER — METOCLOPRAMIDE HCL 5 MG/ML IJ SOLN: 10.0000 mg | Freq: Four times a day (QID) | INTRAMUSCULAR | Status: DC | PRN

## 2017-12-04 MED ORDER — CELECOXIB 200 MG PO CAPS
200.0000 mg | ORAL_CAPSULE | ORAL | Status: AC
  Administered 2017-12-04: 200 mg via ORAL
  Filled 2017-12-04: qty 1

## 2017-12-04 MED ORDER — ENSURE SURGERY PO LIQD
237.0000 mL | Freq: Two times a day (BID) | ORAL | Status: DC
  Filled 2017-12-04 (×4): qty 237

## 2017-12-04 MED ORDER — DIPHENHYDRAMINE HCL 12.5 MG/5ML PO ELIX: 12.5000 mg | ORAL_SOLUTION | Freq: Four times a day (QID) | ORAL | Status: DC | PRN

## 2017-12-04 MED ORDER — ONDANSETRON HCL 4 MG/2ML IJ SOLN
INTRAMUSCULAR | Status: DC | PRN
  Administered 2017-12-04: 4 mg via INTRAVENOUS

## 2017-12-04 MED ORDER — SODIUM CHLORIDE 0.9 % IV SOLN: 1000.0000 mL | Freq: Three times a day (TID) | INTRAVENOUS | Status: DC | PRN

## 2017-12-04 MED ORDER — TRAMADOL HCL 50 MG PO TABS
50.0000 mg | ORAL_TABLET | Freq: Four times a day (QID) | ORAL | Status: DC | PRN
  Administered 2017-12-05 (×3): 100 mg via ORAL
  Filled 2017-12-04 (×3): qty 2

## 2017-12-04 MED ORDER — LIDOCAINE IN D5W 4-5 MG/ML-% IV SOLN
1.0000 mg/min | INTRAVENOUS | Status: DC
  Administered 2017-12-04: 1.5 ug/kg/min via INTRAVENOUS
  Filled 2017-12-04: qty 500

## 2017-12-04 MED ORDER — SUGAMMADEX SODIUM 200 MG/2ML IV SOLN
INTRAVENOUS | Status: DC | PRN
  Administered 2017-12-04: 150 mg via INTRAVENOUS

## 2017-12-04 MED ORDER — KETAMINE HCL 10 MG/ML IJ SOLN
INTRAMUSCULAR | Status: DC | PRN
  Administered 2017-12-04 (×2): 10 mg via INTRAVENOUS
  Administered 2017-12-04: 40 mg via INTRAVENOUS

## 2017-12-04 MED ORDER — POLYETHYLENE GLYCOL 3350 17 GM/SCOOP PO POWD: 1.0000 | Freq: Once | ORAL | Status: DC

## 2017-12-04 MED ORDER — METRONIDAZOLE 500 MG PO TABS
1000.0000 mg | ORAL_TABLET | ORAL | Status: DC
  Filled 2017-12-04: qty 2

## 2017-12-04 MED ORDER — ROCURONIUM BROMIDE 10 MG/ML (PF) SYRINGE
PREFILLED_SYRINGE | INTRAVENOUS | Status: DC | PRN
  Administered 2017-12-04: 20 mg via INTRAVENOUS
  Administered 2017-12-04: 10 mg via INTRAVENOUS
  Administered 2017-12-04: 30 mg via INTRAVENOUS

## 2017-12-04 MED ORDER — ALUM & MAG HYDROXIDE-SIMETH 200-200-20 MG/5ML PO SUSP: 30.0000 mL | Freq: Four times a day (QID) | ORAL | Status: DC | PRN

## 2017-12-04 SURGICAL SUPPLY — 54 items
BLADE SURG 15 STRL LF DISP TIS (BLADE) IMPLANT
BLADE SURG 15 STRL SS (BLADE) ×3
BRIEF STRETCH FOR OB PAD LRG (UNDERPADS AND DIAPERS) ×2 IMPLANT
CABLE HIGH FREQUENCY MONO STRZ (ELECTRODE) ×3 IMPLANT
COVER MAYO STAND STRL (DRAPES) ×2 IMPLANT
COVER WAND RF STERILE (DRAPES) ×2 IMPLANT
DRAPE C-ARM 42X120 X-RAY (DRAPES) ×2 IMPLANT
DRAPE LAPAROTOMY T 102X78X121 (DRAPES) ×2 IMPLANT
DRAPE STERI URO 9X17 APER PCH (DRAPES) ×2 IMPLANT
DRAPE SURG IRRIG POUCH 19X23 (DRAPES) ×3 IMPLANT
DRAPE WARM FLUID 44X44 (DRAPE) ×3 IMPLANT
DRSG PAD ABDOMINAL 8X10 ST (GAUZE/BANDAGES/DRESSINGS) ×2 IMPLANT
ELECT PENCIL ROCKER SW 15FT (MISCELLANEOUS) ×2 IMPLANT
ELECT REM PT RETURN 15FT ADLT (MISCELLANEOUS) ×3 IMPLANT
GAUZE 4X4 16PLY RFD (DISPOSABLE) ×3 IMPLANT
GAUZE SPONGE 4X4 12PLY STRL (GAUZE/BANDAGES/DRESSINGS) ×2 IMPLANT
GLOVE ECLIPSE 8.0 STRL XLNG CF (GLOVE) ×6 IMPLANT
GLOVE INDICATOR 8.0 STRL GRN (GLOVE) ×6 IMPLANT
GOWN STRL REUS W/TWL XL LVL3 (GOWN DISPOSABLE) ×5 IMPLANT
KIT BASIN OR (CUSTOM PROCEDURE TRAY) ×3 IMPLANT
LEGGING LITHOTOMY PAIR STRL (DRAPES) ×1 IMPLANT
LUBRICANT JELLY K Y 4OZ (MISCELLANEOUS) ×3 IMPLANT
NEEDLE HYPO 22GX1.5 SAFETY (NEEDLE) ×3 IMPLANT
PACK BASIC VI WITH GOWN DISP (CUSTOM PROCEDURE TRAY) ×3 IMPLANT
PAD ABD 8X10 STRL (GAUZE/BANDAGES/DRESSINGS) ×2 IMPLANT
PAD POSITIONING PINK XL (MISCELLANEOUS) ×1 IMPLANT
RETRACTOR LONE STAR DISPOSABLE (INSTRUMENTS) IMPLANT
RETRACTOR STAY HOOK 5MM (MISCELLANEOUS) IMPLANT
SCISSORS LAP 5X35 DISP (ENDOMECHANICALS) ×3 IMPLANT
SET IRRIG TUBING LAPAROSCOPIC (IRRIGATION / IRRIGATOR) ×3 IMPLANT
SHEARS HARMONIC ACE PLUS 36CM (ENDOMECHANICALS) ×3 IMPLANT
STOPCOCK 4 WAY LG BORE MALE ST (IV SETS) ×2 IMPLANT
SUT CHROMIC 3 0 SH 27 (SUTURE) IMPLANT
SUT PDS AB 2-0 CT2 27 (SUTURE) IMPLANT
SUT PDS AB 3-0 SH 27 (SUTURE) ×4 IMPLANT
SUT SILK 2 0 (SUTURE)
SUT SILK 2 0 SH CR/8 (SUTURE) IMPLANT
SUT SILK 2-0 18XBRD TIE 12 (SUTURE) IMPLANT
SUT SILK 3 0 SH 30 (SUTURE) IMPLANT
SUT SILK 3 0 SH CR/8 (SUTURE) IMPLANT
SUT V-LOC BARB 180 2/0GR6 GS22 (SUTURE) ×6
SUT VIC AB 2-0 UR6 27 (SUTURE) ×6 IMPLANT
SUT VIC AB 3-0 SH 27 (SUTURE) ×9
SUT VIC AB 3-0 SH 27XBRD (SUTURE) IMPLANT
SUTURE V-LC BRB 180 2/0GR6GS22 (SUTURE) IMPLANT
SYR 20CC LL (SYRINGE) ×3 IMPLANT
SYR BULB IRRIGATION 50ML (SYRINGE) IMPLANT
TOWEL OR 17X26 10 PK STRL BLUE (TOWEL DISPOSABLE) ×3 IMPLANT
TOWEL OR NON WOVEN STRL DISP B (DISPOSABLE) ×3 IMPLANT
TRAY FOLEY MTR SLVR 16FR STAT (SET/KITS/TRAYS/PACK) ×3 IMPLANT
TUBING CONNECTING 10 (TUBING) IMPLANT
TUBING CONNECTING 10' (TUBING)
TUBING INSUF HEATED (TUBING) ×3 IMPLANT
YANKAUER SUCT BULB TIP 10FT TU (MISCELLANEOUS) ×2 IMPLANT

## 2017-12-04 NOTE — Interval H&P Note (Signed)
History and Physical Interval Note:  12/04/2017 12:35 PM  Alyssa Mccarthy  has presented today for surgery, with the diagnosis of Polyp in rectum  The various methods of treatment have been discussed with the patient and family. After consideration of risks, benefits and other options for treatment, the patient has consented to  Procedure(s): PARTIAL PROCTECTOMY REMOVAL OF RECTAL MASS BY TEM ERAS PATHWAY (N/A) as a surgical intervention .  The patient's history has been reviewed, patient examined, no change in status, stable for surgery.  I have reviewed the patient's chart and labs.  Questions were answered to the patient's satisfaction.    I have re-reviewed the the patient's records, history, medications, and allergies.  I have re-examined the patient.  I again discussed intraoperative plans and goals of post-operative recovery.  The patient agrees to proceed.  Alyssa Mccarthy  [birth year] [id number]  Patient Care Team: [name], MD as PCP - General (Endocrinology) Michael Boston, MD as Consulting Physician (General Surgery) Ronald Lobo, MD as Consulting Physician (Gastroenterology) Sonia Baller, MD as Referring Physician (Neurology)  Patient Active Problem List   Diagnosis Date Noted  . Cerebral thrombosis with cerebral infarction 02/18/2016  . Anisocoria 02/17/2016  . Epilepsy ([place]) 02/17/2016  . Essential tremor 02/17/2016  . HLD (hyperlipidemia) 02/17/2016  . Headache 02/17/2016  . Stroke-like symptoms 02/17/2016    Past Medical History:  Diagnosis Date  . Complication of anesthesia   . Essential tremor   . Hyperlipidemia   . Hypothyroidism   . PONV (postoperative nausea and vomiting)   . Seizure disorder ([place])   . Stroke [facility])     Past Surgical History:  Procedure Laterality Date  . ABDOMINAL HYSTERECTOMY    . APPENDECTOMY    . BRAIN SURGERY  2015   deep brain stimulator implants bilaterally  . CARPAL TUNNEL RELEASE    . TOTAL KNEE ARTHROPLASTY       Social History   Socioeconomic History  . Marital status: Married    Spouse name: Not on file  . Number of children: Not on file  . Years of education: Not on file  . Highest education level: Not on file  Occupational History  . Not on file  Social Needs  . Financial resource strain: Not on file  . Food insecurity:    Worry: Not on file    Inability: Not on file  . Transportation needs:    Medical: Not on file    Non-medical: Not on file  Tobacco Use  . Smoking status: Never Smoker  . Smokeless tobacco: Never Used  Substance and Sexual Activity  . Alcohol use: Yes    Comment: socially  . Drug use: No  . Sexual activity: Not on file  Lifestyle  . Physical activity:    Days per week: Not on file    Minutes per session: Not on file  . Stress: Not on file  Relationships  . Social connections:    Talks on phone: Not on file    Gets together: Not on file    Attends religious service: Not on file    Active member of club or organization: Not on file    Attends meetings of clubs or organizations: Not on file    Relationship status: Not on file  . Intimate partner violence:    Fear of current or ex partner: Not on file    Emotionally abused: Not on file    Physically abused: Not on file  Forced sexual activity: Not on file  Other Topics Concern  . Not on file  Social History Narrative   Lives in 1 story home with her husband and 2 sons   Has 2 adult sons   Has b[employer]. Degree   Works as Designer, jewellery    Family History  Problem Relation Age of Onset  . Heart attack Father 32  . Stroke Neg Hx   . Breast cancer Neg Hx     Medications Prior to Admission  Medication Sig Dispense Refill Last Dose  . aspirin EC 325 MG EC tablet Take 1 tablet (325 mg total) by mouth daily. 30 tablet 1 11/29/2017  . atorvastatin (LIPITOR) 40 MG tablet Take 1 tablet (40 mg total) by mouth every evening. 30 tablet 1 12/03/2017 at Unknown time  . clonazePAM (KLONOPIN) 0.5 MG tablet  Take 0.5 mg by mouth 2 (two) times daily as needed (for seizures).    11/29/2017  . divalproex (DEPAKOTE ER) 500 MG 24 hr tablet Take 1 tablet (500 mg total) by mouth at bedtime. (Patient taking differently: Take 500 mg by mouth every evening. ) 90 tablet 3 12/03/2017 at Unknown time  . ezetimibe (ZETIA) 10 MG tablet Take 10 mg by mouth daily.    12/03/2017 at Unknown time  . lamoTRIgine (LAMICTAL) 100 MG tablet Take 1 tablet (100 mg total) by mouth 2 (two) times daily. 180 tablet 3 12/04/2017 at 0830  . levETIRAcetam (KEPPRA XR) 500 MG 24 hr tablet Take 5 tablets (2,500 mg total) by mouth daily. 450 tablet 3 12/04/2017 at  0830  . levothyroxine (SYNTHROID, LEVOTHROID) 88 MCG tablet Take 88 mcg by mouth daily before breakfast.   12/04/2017 at 0830  . Melatonin 5 MG TABS Take 15 mg by mouth as needed.   12/02/2017  . Omega-3 Fatty Acids (OMEGA 3 PO) Take 1 capsule by mouth daily.   11/28/2017  . primidone (MYSOLINE) 50 MG tablet Take 100-150 mg by mouth See admin instructions. Take 100 mg by mouth in the morning and take 150 mg by mouth in the evening   12/04/2017 at 0830  . sertraline (ZOLOFT) 50 MG tablet Take 3 tablets daily (Patient taking differently: Take 50-100 mg by mouth See admin instructions. Take 100 mg by mouth in the morning and take 50 mg by mouth in the evening) 270 tablet 3 12/04/2017 at 0830  . zolpidem (AMBIEN) 10 MG tablet Take 1 tablet (10 mg total) by mouth at bedtime. 30 tablet 5 12/03/2017 at Unknown time    Current Facility-Administered Medications  Medication Dose Route Frequency Provider Last Rate Last Dose  . bisacodyl (DULCOLAX) EC tablet 20 mg  20 mg Oral Once Michael Boston, MD      . bupivacaine liposome (EXPAREL) 1.3 % injection 266 mg  20 mL Infiltration Once [place], [place], [facility]      . Chlorhexidine Gluconate Cloth 2 % PADS 6 each  6 each Topical Once Michael Boston, MD       And  . Chlorhexidine Gluconate Cloth 2 % PADS 6 each  6 each Topical Once Michael Boston, MD       . clindamycin (CLEOCIN) 900 mg, gentamicin (GARAMYCIN) 240 mg in sodium chloride 0.9 % 1,000 mL for intraperitoneal lavage   Intraperitoneal To OR Michael Boston, MD      . clindamycin (CLEOCIN) 900 MG/50ML IVPB           . clindamycin (CLEOCIN) IVPB 900 mg  900 mg Intravenous 60  min Pre-Op [place], [place], [facility]       And  . gentamicin (GARAMYCIN) 290 mg in dextrose 5 % 100 mL IVPB  5 mg/kg (Adjusted) Intravenous 60 min Pre-Op Lenis Noon, RPH      . lactated ringers infusion   Intravenous Continuous Audry Pili, MD 50 mL/hr at 12/04/17 1134    . lidocaine (cardiac) 2000 mg in dextrose 5% 500 mL (4mg /mL) IV infusion  1 mg/min Intravenous Continuous Michael Boston, MD      . neomycin [facility]) tablet 1,000 mg  1,000 mg Oral 3 times per day Michael Boston, MD       And  . metroNIDAZOLE (FLAGYL) tablet 1,000 mg  1,000 mg Oral 3 times per day Michael Boston, MD      . polyethylene glycol powder (GLYCOLAX/MIRALAX) container 255 g  1 Container Oral Once Michael Boston, MD      . propofol (DIPRIVAN) 1000 MG/100ML infusion  5-80 mcg/kg/min Intravenous Titrated Michael Boston, MD         Allergies  Allergen Reactions  . Gluten Meal Diarrhea and Other (See Comments)    Severe diarrhea  . Other Other (See Comments)    Bleach-respiratory distress  . Plavix [Clopidogrel Bisulfate] Itching, Rash and Other (See Comments)    Hands and feet   . Protonix [Pantoprazole] Itching and Other (See Comments)    Whelps  . Penicillins Itching, Rash and Other (See Comments)    Childhood allergy- specifics unknown; tolerated amoxicillin and ancef since then     BP (!) 144/81   Pulse 84   Temp 97.8 F (36.6 C) (Oral)   Resp 15   Ht 5\' 2"  (1.575 m)   Wt 70.5 kg   SpO2 97%   BMI 28.42 kg/m   Labs: No results found for this or any previous visit (from the past 48 hour(s)).  Imaging / Studies: No results found.   Adin Hector, M.D., F.A.C.S. Gastrointestinal and Minimally Invasive  Surgery [facility] [address], [place], [place] [address] [phone] Main / Paging  12/04/2017 12:35 PM    Adin Hector

## 2017-12-04 NOTE — Anesthesia Procedure Notes (Signed)
Procedure Name: Intubation Date/Time: 12/04/2017 1:26 PM Performed by: Anne Fu, CRNA Pre-anesthesia Checklist: Patient identified, Emergency Drugs available, Suction available, Patient being monitored and Timeout performed Patient Re-evaluated:Patient Re-evaluated prior to induction Oxygen Delivery Method: Circle system utilized Preoxygenation: Pre-oxygenation with 100% oxygen Induction Type: IV induction Ventilation: Mask ventilation without difficulty Laryngoscope Size: Mac and 4 Grade View: Grade I Tube type: Oral Tube size: 7.5 mm Number of attempts: 1 Airway Equipment and Method: Stylet Placement Confirmation: ETT inserted through vocal cords under direct vision,  positive ETCO2 and breath sounds checked- equal and bilateral Secured at: 21 cm Tube secured with: Tape Dental Injury: Teeth and Oropharynx as per pre-operative assessment

## 2017-12-04 NOTE — Progress Notes (Signed)
Called by RN and notified of patient experiencing systemic weakness in PACU. Patient with decreased grip strength bilaterally and inability to maintain head lift. Patient given 150 mg total of Suggammadex in divided doses over 5 minutes. Patient reports significant improvement in strength.

## 2017-12-04 NOTE — Anesthesia Preprocedure Evaluation (Addendum)
Anesthesia Evaluation  Patient identified by MRN, date of birth, ID band Patient awake    Reviewed: Allergy & Precautions, NPO status , Patient's Chart, lab work & pertinent test results  History of Anesthesia Complications (+) PONV and history of anesthetic complications  Airway Mallampati: II  TM Distance: >3 FB Neck ROM: Full    Dental  (+) Dental Advisory Given   Pulmonary neg pulmonary ROS,    breath sounds clear to auscultation       Cardiovascular (-) angina+ Peripheral Vascular Disease   Rhythm:Regular Rate:Normal   '18 TTE - EF 55% to 60%. Grade 1 diastolic dysfunction   Neuro/Psych  Headaches, Seizures -, Well Controlled,   Anisocoria   CVA, Residual Symptoms negative psych ROS   GI/Hepatic negative GI ROS, Neg liver ROS,   Endo/Other  Hypothyroidism   Renal/GU negative Renal ROS     Musculoskeletal negative musculoskeletal ROS (+)   Abdominal   Peds  Hematology negative hematology ROS (+)   Anesthesia Other Findings   Reproductive/Obstetrics  S/p hysterectomy                             Anesthesia Physical Anesthesia Plan  ASA: III  Anesthesia Plan: General   Post-op Pain Management:    Induction: Intravenous  PONV Risk Score and Plan: 4 or greater and Treatment may vary due to age or medical condition, Ondansetron and TIVA  Airway Management Planned: Oral ETT  Additional Equipment: None  Intra-op Plan:   Post-operative Plan: Extubation in OR  Informed Consent: I have reviewed the patients History and Physical, chart, labs and discussed the procedure including the risks, benefits and alternatives for the proposed anesthesia with the patient or authorized representative who has indicated his/her understanding and acceptance.   Dental advisory given  Plan Discussed with: CRNA and Anesthesiologist  Anesthesia Plan Comments:        Anesthesia Quick  Evaluation

## 2017-12-04 NOTE — Transfer of Care (Signed)
Immediate Anesthesia Transfer of Care Note  Patient: Alyssa Mccarthy  Procedure(s) Performed: Procedure(s): PARTIAL PROCTECTOMY REMOVAL OF RECTAL MASS BY TEM ERAS PATHWAY (N/A)  Patient Location: PACU  Anesthesia Type:TIVA  Level of Consciousness:  sedated, patient cooperative and responds to stimulation  Airway & Oxygen Therapy:Patient Spontanous Breathing and Patient connected to face mask oxgen  Post-op Assessment:  Report given to PACU RN and Post -op Vital signs reviewed and stable  Post vital signs:  Reviewed and stable  Last Vitals:  Vitals:   12/04/17 1117  BP: (!) 144/81  Pulse: 84  Resp: 15  Temp: 36.6 C  SpO2: 49%    Complications: No apparent anesthesia complications

## 2017-12-04 NOTE — Op Note (Signed)
12/04/2017  3:46 PM  PATIENT:  Alyssa Mccarthy  52 y.o. female  Patient Care Team: [name], MD as PCP - General (Endocrinology) Michael Boston, MD as Consulting Physician (General Surgery) Ronald Lobo, MD as Consulting Physician (Gastroenterology) Sonia Baller, MD as Referring Physician (Neurology)  PRE-OPERATIVE DIAGNOSIS:  Adenomatous Polyp in mid rectum  POST-OPERATIVE DIAGNOSIS:   Adenomatous Polyp in mid rectum  PROCEDURE:  Procedure(s): PARTIAL PROCTECTOMY REMOVAL OF RECTAL MASS BY TEM ERAS PATHWAY  SURGEON:  Adin Hector, MD  ASSISTANT: OR Staff   ANESTHESIA:   local and general  EBL:  Total I/O In: 1000 [I.V.:1000] Out: 125 [Urine:100; Blood:25]  Delay start of Pharmacological VTE agent (>24hrs) due to surgical blood loss or risk of bleeding:  no  DRAINS: none   SPECIMEN:  Large mid rectal polyp (pins below)  DISPOSITION OF SPECIMEN:  PATHOLOGY  COUNTS:  YES  PLAN OF CARE: Admit for overnight observation  PATIENT DISPOSITION:  PACU - hemodynamically stable.  INDICATION: Pleasant woman found to have large mid rectal polyp over rectal fold.  Not amenable to endoscopic resection.  Surgical consultation requested for removal.  The anatomy & physiology of the digestive tract was discussed.  The pathophysiology of the rectal pathology was discussed.  Natural history risks without surgery was discussed.   I feel the risks of no intervention will lead to serious problems that outweigh the operative risks; therefore, I recommended surgery.    Laparoscopic & open abdominal techniques were discussed.  I recommended we start with a partial proctectomy by transanal endoscopic microsurgery (TEM) for excisional biopsy to remove the pathology and hopefully cure and/or control the pathology.  This technique can offer less operative risk and faster post-operative recovery.  Possible need for immediate or later abdominal surgery for further treatment was  discussed.   Risks such as bleeding, abscess, reoperation, ostomy, heart attack, death, and other risks were discussed.   I noted a good likelihood this will help address the problem.  Goals of post-operative recovery were discussed as well.  We will work to minimize complications.  An educational handout was given as well.  Questions were answered.  The patient expresses understanding & wishes to proceed with surgery.  OR FINDINGS: Patient had a large sessile polyp in the mid rectum going from the left posterior lateral wall towards the anterior midline.  5 x 5 cm.  The resulting mass was 6x 7 cm in size  Pin placement on pathology specimen: Proximal margin: Purple Distal margin: Red Left anterior: White Left posterior:  Yellow  The closure rests 5-6cm from the anal verge in the left hemicircumference (11-5 o'clock).  It is 50% of the circumference.  DESCRIPTION: Informed consent was confirmed.  Patient received general anesthesia without difficulty.  Foley catheter sterilely placed.  Sequential compression devices active during the entire case.  The patient was placed in the prone position, taking extra care to secure and protect the patient appropriately.  The perineum and perianal regions were prepped and draped in a sterile fashion.  Surgical timeout confirmed our plan.  I did a gentle digital rectal examination with gradual anal dilation to allow placement of the 4 cm TEO Stortz scope.  This was secured to the bed using the TEO clamping system.  We induced carbon dioxide insufflation intraluminally.  I oriented the Stortz scope and the patient such that the specimen rested towards the floor at the 6:00 position.  I could easily identify the mass.  I went  ahead and used tip point cautery to mark 1 cm margins circumferentially.  I then did a full thickness transection at the distal margin.  I came around laterally.  Switched over to harmonic dissection.  Lifted the specimen off the pelvic  canal for a good deep margin.  I gradually came more proximally.  Transected at the proximal margin.  I ensured hemostasis.  I did breech into the peritoneum near the anterior midline.  I inspected the main specimen and pinned it on thick cork board.  Pins as noted above.  I walked the specimen down to pathology and showed the [facility] pathology team for proper orientation.    I went back in and scrubbed in.  Hemostasis was good.  I reapproximated the visceral peritoneum at the anterior cul-de-sac with 2-0 Vicryl running suture.  I reapproximated the rectal wound with a 2-0 V-lock horizontal mattress stitch to bring the middle part of the rectum down to help close the middle of the wound.  I then closed the left posterior half of the wound using that 2-0 V-lock serrated stitch in a running fashion to the posterior midline corner.  I then closed the anterior half of the hemi-circumferential wound with a near another 2-0 V-lock suture starting at the slightly right anterior midline circumference and running back to the left lateral aspect, overlapping..  This brought things together well.  I did meticulous inspection with fine tip instruments to confirm good watertight closure   there was some redundant mucosa in the left anterolateral wall.  I closed it with 2-0 Vicryl horizontal mattress suture to good effect hemostasis excellent.  The lumen was quite patent.  Carbon dioxide evacuated & instruments removed.  The patient is being extubated go to recovery room.  I discussed operative findings, updated the patient's status, discussed probable steps to recovery, and gave postoperative recommendations to the patient's spouse.  Recommendations were made.  Questions were answered.  He expressed understanding & appreciation.   Adin Hector, MD, FACS, MASCRS Gastrointestinal and Minimally Invasive Surgery    [address], [place],  [address] [phone] Main / Paging [phone]  Fax

## 2017-12-04 NOTE — Discharge Instructions (Signed)
SURGERY: POST OP INSTRUCTIONS °(Surgery for small bowel obstruction, colon resection, etc) ° ° °###################################################################### ° °EAT °Gradually transition to a high fiber diet with a fiber supplement over the next few days after discharge ° °WALK °Walk an hour a day.  Control your pain to do that.   ° °CONTROL PAIN °Control pain so that you can walk, sleep, tolerate sneezing/coughing, go up/down stairs. ° °HAVE A BOWEL MOVEMENT DAILY °Keep your bowels regular to avoid problems.  OK to try a laxative to override constipation.  OK to use an antidairrheal to slow down diarrhea.  Call if not better after 2 tries ° °CALL IF YOU HAVE PROBLEMS/CONCERNS °Call if you are still struggling despite following these instructions. °Call if you have concerns not answered by these instructions ° °###################################################################### ° ° °DIET °Follow a light diet the first few days at home.  Start with a bland diet such as soups, liquids, starchy foods, low fat foods, etc.  If you feel full, bloated, or constipated, stay on a ful liquid or pureed/blenderized diet for a few days until you feel better and no longer constipated. °Be sure to drink plenty of fluids every day to avoid getting dehydrated (feeling dizzy, not urinating, etc.). °Gradually add a fiber supplement to your diet over the next week.  Gradually get back to a regular solid diet.  Avoid fast food or heavy meals the first week as you are more likely to get nauseated. °It is expected for your digestive tract to need a few months to get back to normal.  It is common for your bowel movements and stools to be irregular.  You will have occasional bloating and cramping that should eventually fade away.  Until you are eating solid food normally, off all pain medications, and back to regular activities; your bowels will not be normal. °Focus on eating a low-fat, high fiber diet the rest of your life  (See Getting to Good Bowel Health, below). ° °CARE of your INCISION or WOUND °It is good for closed incision and even open wounds to be washed every day.  Shower every day.  Short baths are fine.  Wash the incisions and wounds clean with soap & water.    °If you have a closed incision(s), wash the incision with soap & water every day.  You may leave closed incisions open to air if it is dry.   You may cover the incision with clean gauze & replace it after your daily shower for comfort. °If you have skin tapes (Steristrips) or skin glue (Dermabond) on your incision, leave them in place.  They will fall off on their own like a scab.  You may trim any edges that curl up with clean scissors.  If you have staples, set up an appointment for them to be removed in the office in 10 days after surgery.  °If you have a drain, wash around the skin exit site with soap & water and place a new dressing of gauze or band aid around the skin every day.  Keep the drain site clean & dry.    °If you have an open wound with packing, see wound care instructions.  In general, it is encouraged that you remove your dressing and packing, shower with soap & water, and replace your dressing once a day.  Pack the wound with clean gauze moistened with normal (0.9%) saline to keep the wound moist & uninfected.  Pressure on the dressing for 30 minutes will stop most wound   bleeding.  Eventually your body will heal & pull the open wound closed over the next few months.  °Raw open wounds will occasionally bleed or secrete yellow drainage until it heals closed.  Drain sites will drain a little until the drain is removed.  Even closed incisions can have mild bleeding or drainage the first few days until the skin edges scab over & seal.   °If you have an open wound with a wound vac, see wound vac care instructions. ° ° ° ° °ACTIVITIES as tolerated °Start light daily activities --- self-care, walking, climbing stairs-- beginning the day after surgery.   Gradually increase activities as tolerated.  Control your pain to be active.  Stop when you are tired.  Ideally, walk several times a day, eventually an hour a day.   °Most people are back to most day-to-day activities in a few weeks.  It takes 4-8 weeks to get back to unrestricted, intense activity. °If you can walk 30 minutes without difficulty, it is safe to try more intense activity such as jogging, treadmill, bicycling, low-impact aerobics, swimming, etc. °Save the most intensive and strenuous activity for last (Usually 4-8 weeks after surgery) such as sit-ups, heavy lifting, contact sports, etc.  Refrain from any intense heavy lifting or straining until you are off narcotics for pain control.  You will have off days, but things should improve week-by-week. °DO NOT PUSH THROUGH PAIN.  Let pain be your guide: If it hurts to do something, don't do it.  Pain is your body warning you to avoid that activity for another week until the pain goes down. °You may drive when you are no longer taking narcotic prescription pain medication, you can comfortably wear a seatbelt, and you can safely make sudden turns/stops to protect yourself without hesitating due to pain. °You may have sexual intercourse when it is comfortable. If it hurts to do something, stop. ° °MEDICATIONS °Take your usually prescribed home medications unless otherwise directed.   °Blood thinners:  °Usually you can restart any strong blood thinners after the second postoperative day.  It is OK to take aspirin right away.    ° If you are on strong blood thinners (warfarin/Coumadin, Plavix, Xerelto, Eliquis, Pradaxa, etc), discuss with your surgeon, medicine PCP, and/or cardiologist for instructions on when to restart the blood thinner & if blood monitoring is needed (PT/INR blood check, etc).   ° ° °PAIN CONTROL °Pain after surgery or related to activity is often due to strain/injury to muscle, tendon, nerves and/or incisions.  This pain is usually  short-term and will improve in a few months.  °To help speed the process of healing and to get back to regular activity more quickly, DO THE FOLLOWING THINGS TOGETHER: °1. Increase activity gradually.  DO NOT PUSH THROUGH PAIN °2. Use Ice and/or Heat °3. Try Gentle Massage and/or Stretching °4. Take over the counter pain medication °5. Take Narcotic prescription pain medication for more severe pain ° °Good pain control = faster recovery.  It is better to take more medicine to be more active than to stay in bed all day to avoid medications. °1.  Increase activity gradually °Avoid heavy lifting at first, then increase to lifting as tolerated over the next 6 weeks. °Do not “push through” the pain.  Listen to your body and avoid positions and maneuvers than reproduce the pain.  Wait a few days before trying something more intense °Walking an hour a day is encouraged to help your body recover faster   and more safely.  Start slowly and stop when getting sore.  If you can walk 30 minutes without stopping or pain, you can try more intense activity (running, jogging, aerobics, cycling, swimming, treadmill, sex, sports, weightlifting, etc.) °Remember: If it hurts to do it, then don’t do it! °2. Use Ice and/or Heat °You will have swelling and bruising around the incisions.  This will take several weeks to resolve. °Ice packs or heating pads (6-8 times a day, 30-60 minutes at a time) will help sooth soreness & bruising. °Some people prefer to use ice alone, heat alone, or alternate between ice & heat.  Experiment and see what works best for you.  Consider trying ice for the first few days to help decrease swelling and bruising; then, switch to heat to help relax sore spots and speed recovery. °Shower every day.  Short baths are fine.  It feels good!  Keep the incisions and wounds clean with soap & water.   °3. Try Gentle Massage and/or Stretching °Massage at the area of pain many times a day °Stop if you feel pain - do not  overdo it °4. Take over the counter pain medication °This helps the muscle and nerve tissues become less irritable and calm down faster °Choose ONE of the following over-the-counter anti-inflammatory medications: °Acetaminophen 500mg tabs (Tylenol) 1-2 pills with every meal and just before bedtime (avoid if you have liver problems or if you have acetaminophen in you narcotic prescription) °Naproxen 220mg tabs (ex. Aleve, Naprosyn) 1-2 pills twice a day (avoid if you have kidney, stomach, IBD, or bleeding problems) °Ibuprofen 200mg tabs (ex. Advil, Motrin) 3-4 pills with every meal and just before bedtime (avoid if you have kidney, stomach, IBD, or bleeding problems) °Take with food/snack several times a day as directed for at least 2 weeks to help keep pain / soreness down & more manageable. °5. Take Narcotic prescription pain medication for more severe pain °A prescription for strong pain control is often given to you upon discharge (for example: oxycodone/Percocet, hydrocodone/Norco/Vicodin, or tramadol/Ultram) °Take your pain medication as prescribed. °Be mindful that most narcotic prescriptions contain Tylenol (acetaminophen) as well - avoid taking too much Tylenol. °If you are having problems/concerns with the prescription medicine (does not control pain, nausea, vomiting, rash, itching, etc.), please call us [phone] to see if we need to switch you to a different pain medicine that will work better for you and/or control your side effects better. °If you need a refill on your pain medication, you must call the office before 4 pm and on weekdays only.  By federal law, prescriptions for narcotics cannot be called into a pharmacy.  They must be filled out on paper & picked up from our office by the patient or authorized caretaker.  Prescriptions cannot be filled after 4 pm nor on weekends.   ° °WHEN TO CALL US [phone] °Severe uncontrolled or worsening pain  °Fever over 101 F (38.5 C) °Concerns with  the incision: Worsening pain, redness, rash/hives, swelling, bleeding, or drainage °Reactions / problems with new medications (itching, rash, hives, nausea, etc.) °Nausea and/or vomiting °Difficulty urinating °Difficulty breathing °Worsening fatigue, dizziness, lightheadedness, blurred vision °Other concerns °If you are not getting better after two weeks or are noticing you are getting worse, contact our office [phone] for further advice.  We may need to adjust your medications, re-evaluate you in the office, send you to the emergency room, or see what other things we can do to help. °The   clinic staff is available to answer your questions during regular business hours (8:30am-5pm).  Please don’t hesitate to call and ask to speak to one of our nurses for clinical concerns.    °A surgeon from [facility] is always on call at the hospitals 24 hours/day °If you have a medical emergency, go to the nearest emergency room or call 911. ° °FOLLOW UP in our office °One the day of your discharge from the hospital (or the next business weekday), please call [facility] to set up or confirm an appointment to see your surgeon in the office for a follow-up appointment.  Usually it is 2-3 weeks after your surgery.   °If you have skin staples at your incision(s), let the office know so we can set up a time in the office for the nurse to remove them (usually around 10 days after surgery). °Make sure that you call for appointments the day of discharge (or the next business weekday) from the hospital to ensure a convenient appointment time. °IF YOU HAVE DISABILITY OR FAMILY LEAVE FORMS, BRING THEM TO THE OFFICE FOR PROCESSING.  DO NOT GIVE THEM TO YOUR DOCTOR. ° °[facility] °[address], [address], [place],   [address] ? °[phone] - Main °[phone] - Toll Free,  [phone] - Fax °[email or web address] ° °GETTING TO GOOD BOWEL HEALTH. °It is  expected for your digestive tract to need a few months to get back to normal.  It is common for your bowel movements and stools to be irregular.  You will have occasional bloating and cramping that should eventually fade away.  Until you are eating solid food normally, off all pain medications, and back to regular activities; your bowels will not be normal.   °Avoiding constipation °The goal: ONE SOFT BOWEL MOVEMENT A DAY!    °Drink plenty of fluids.  Choose water first. °TAKE A FIBER SUPPLEMENT EVERY DAY THE REST OF YOUR LIFE °During your first week back home, gradually add back a fiber supplement every day °Experiment which form you can tolerate.   There are many forms such as powders, tablets, wafers, gummies, etc °Psyllium bran (Metamucil), methylcellulose (Citrucel), Miralax or Glycolax, Benefiber, Flax Seed.  °Adjust the dose week-by-week (1/2 dose/day to 6 doses a day) until you are moving your bowels 1-2 times a day.  Cut back the dose or try a different fiber product if it is giving you problems such as diarrhea or bloating. °Sometimes a laxative is needed to help jump-start bowels if constipated until the fiber supplement can help regulate your bowels.  If you are tolerating eating & you are farting, it is okay to try a gentle laxative such as double dose MiraLax, prune juice, or Milk of Magnesia.  Avoid using laxatives too often. °Stool softeners can sometimes help counteract the constipating effects of narcotic pain medicines.  It can also cause diarrhea, so avoid using for too long. °If you are still constipated despite taking fiber daily, eating solids, and a few doses of laxatives, call our office. °Controlling diarrhea °Try drinking liquids and eating bland foods for a few days to avoid stressing your intestines further. °Avoid dairy products (especially milk & ice cream) for a short time.  The intestines often can lose the ability to digest lactose when stressed. °Avoid foods that cause gassiness or  bloating.  Typical foods include beans and other legumes, cabbage, broccoli, and dairy foods.  Avoid greasy, spicy, fast foods.  Every person has   some sensitivity to other foods, so listen to your body and avoid those foods that trigger problems for you. Probiotics (such as active yogurt, Align, etc) may help repopulate the intestines and colon with normal bacteria and calm down a sensitive digestive tract Adding a fiber supplement gradually can help thicken stools by absorbing excess fluid and retrain the intestines to act more normally.  Slowly increase the dose over a few weeks.  Too much fiber too soon can backfire and cause cramping & bloating. It is okay to try and slow down diarrhea with a few doses of antidiarrheal medicines.   Bismuth subsalicylate (ex. Kayopectate, Pepto Bismol) for a few doses can help control diarrhea.  Avoid if pregnant.   Loperamide (Imodium) can slow down diarrhea.  Start with one tablet (2mg ) first.  Avoid if you are having fevers or severe pain.  ILEOSTOMY PATIENTS WILL HAVE CHRONIC DIARRHEA since their colon is not in use.    Drink plenty of liquids.  You will need to drink even more glasses of water/liquid a day to avoid getting dehydrated. Record output from your ileostomy.  Expect to empty the bag every 3-4 hours at first.  Most people with a permanent ileostomy empty their bag 4-6 times at the least.   Use antidiarrheal medicine (especially Imodium) several times a day to avoid getting dehydrated.  Start with a dose at bedtime & breakfast.  Adjust up or down as needed.  Increase antidiarrheal medications as directed to avoid emptying the bag more than 8 times a day (every 3 hours). Work with your wound ostomy nurse to learn care for your ostomy.  See ostomy care instructions. TROUBLESHOOTING IRREGULAR BOWELS 1) Start with a soft & bland diet. No spicy, greasy, or fried foods.  2) Avoid gluten/wheat or dairy products from diet to see if symptoms improve. 3) Miralax  17gm or flax seed mixed in [place]. water or juice-daily. May use 2-4 times a day as needed. 4) Gas-X, Phazyme, etc. as needed for gas & bloating.  5) Prilosec (omeprazole) over-the-counter as needed 6)  Consider probiotics (Align, Activa, etc) to help calm the bowels down  Call your doctor if you are getting worse or not getting better.  Sometimes further testing (cultures, endoscopy, X-ray studies, CT scans, bloodwork, etc.) may be needed to help diagnose and treat the cause of the diarrhea. [facility], [place], [place], [place], [place]  [address] [phone] - Main.    [phone]  - Toll Free.   [phone] - Fax [email or web address]  TRANSANAL ENDOSCOPIC MICROSURGERY (TEM)  Transanal endoscopic microsurgery (TEM) was developed as a means to provide a less invasive way to operate on the rectum.  This may needed to provide a good resection of part of the rectal wall to remove a large pre-cancerous polyp or an early cancer in which the patient cannot tolerate an open surgery or has an extremely hostile abdomen that makes the resection very risky.    The rectum is the last foot of the tubular digestive tract.  It resides in the pelvis, laying on top of your tailbone.  It is the final reservoir for stool before it is evacuated through the anus in the process of defecation, naturally stretching and contracting to allow time for someone to control bowel function.  The rectum is a common location for polyps or even a cancer.    In most cases when a polyp is found in the colon or rectum, a person often  does not need to have a large resection of the rectum, but have it excised by a colonoscope.  However, some polyps are too large to be safely removed through endoscopy and require surgery.  Classically, this is done through an open incision where a low anterior resection or abdominoperineal resection where part or the entire rectum is removed.  Often, only  part of a wall of the rectum needs to be removed.   Transanal endoscopic microsurgery (TEM) was developed as a means to provide a less invasive way to operate on the rectum.    Transanal endoscopic microsurgery (TEM) involves the patient to be placed under complete general anesthesia.  The anus is gently dilated and a metal tube is placed into the rectum.  Through the tube, absorbable gas is infused and long instruments are used to help access and cut out the polyp or tumor from part of the rectum.  The long instruments are also used to help sew the wound shut.  The specimen is then sent for pathology.  The procedure itself usually takes a few hours of time.  The patient stays at least overnight.  When they can tolerate a regular diet and have adequate pain control they usually leave the hospital in one or two days.    The advantage of TEM is that as opposed to a long hospital stay, patient recovery is much faster.  People  less likely to have bowel or other problems.  Careful pre-operative selection is essential to make sure that the patient is an appropriate candidate for the surgery.  Tumors or cancers that are very large or invasive usually are much more difficult to remove by this technique and are not considered the first option.  Persons who are most appropriate for this surgery are those with large polyps that have not become cancers or early cancers in patients who have high risks with larger surgery.   Sometimes a more aggressive laparoscopic or open follow up resection is needed if a cancer is found to give the best chance at pure.    In general, surgery has a better chance at cure if a cancer is found but may not needed if it is only a precancerous polyp.  Risks to the surgery such as pain, bleeding, abscess, leak, ostomy, and death are inherent but overall the TEM procedure is less stressful and less risky to the patient than a classic colorectal resection throughthe abdomen.  Your colorectal  surgeon can help decide what option is best for you.    Colon Polyps Polyps are tissue growths inside the body. Polyps can grow in many places, including the large intestine (colon). A polyp may be a round bump or a mushroom-shaped growth. You could have one polyp or several. Most colon polyps are noncancerous (benign). However, some colon polyps can become cancerous over time. What are the causes? The exact cause of colon polyps is not known. What increases the risk? This condition is more likely to develop in people who:  Have a family history of colon cancer or colon polyps.  Are older than 26 or older than 45 if they are African American.  Have inflammatory bowel disease, such as ulcerative colitis or Crohn disease.  Are overweight.  Smoke cigarettes.  Do not get enough exercise.  Drink too much alcohol.  Eat a diet that is: ? High in fat and red meat. ? Low in fiber.  Had childhood cancer that was treated with abdominal radiation.  What are the  signs or symptoms? Most polyps do not cause symptoms. If you have symptoms, they may include:  Blood coming from your rectum when having a bowel movement.  Blood in your stool.The stool may look dark red or black.  A change in bowel habits, such as constipation or diarrhea.  How is this diagnosed? This condition is diagnosed with a colonoscopy. This is a procedure that uses a lighted, flexible scope to look at the inside of your colon. How is this treated? Treatment for this condition involves removing any polyps that are found. Those polyps will then be tested for cancer. If cancer is found, your health care provider will talk to you about options for colon cancer treatment. Follow these instructions at home: Diet  Eat plenty of fiber, such as fruits, vegetables, and whole grains.  Eat foods that are high in calcium and vitamin D, such as milk, cheese, yogurt, eggs, liver, fish, and broccoli.  Limit foods high in fat,  red meats, and processed meats, such as hot dogs, sausage, bacon, and lunch meats.  Maintain a healthy weight, or lose weight if recommended by your health care provider. General instructions  Do not smoke cigarettes.  Do not drink alcohol excessively.  Keep all follow-up visits as told by your health care provider. This is important. This includes keeping regularly scheduled colonoscopies. Talk to your health care provider about when you need a colonoscopy.  Exercise every day or as told by your health care provider. Contact a health care provider if:  You have new or worsening bleeding during a bowel movement.  You have new or increased blood in your stool.  You have a change in bowel habits.  You unexpectedly lose weight. This information is not intended to replace advice given to you by your health care provider. Make sure you discuss any questions you have with your health care provider. Document Released: 10/06/2003 Document Revised: 06/17/2015 Document Reviewed: 11/30/2014 Elsevier Interactive Patient Education  [employer].

## 2017-12-05 ENCOUNTER — Encounter (HOSPITAL_COMMUNITY): Payer: Self-pay | Admitting: Surgery

## 2017-12-05 DIAGNOSIS — E876 Hypokalemia: Secondary | ICD-10-CM

## 2017-12-05 LAB — BASIC METABOLIC PANEL
Anion gap: 7 (ref 5–15)
CHLORIDE: 107 mmol/L (ref 98–111)
CO2: 27 mmol/L (ref 22–32)
CREATININE: 0.7 mg/dL (ref 0.44–1.00)
Calcium: 8.4 mg/dL — ABNORMAL LOW (ref 8.9–10.3)
GFR calc Af Amer: >60 mL/min (ref >60)
GFR calc non Af Amer: >60 mL/min (ref >60)
GLUCOSE: 87 mg/dL (ref 70–99)
Potassium: 3.2 mmol/L — ABNORMAL LOW (ref 3.5–5.1)
SODIUM: 141 mmol/L (ref 135–145)

## 2017-12-05 LAB — CBC
HEMATOCRIT: 35.4 % — AB (ref 36.0–46.0)
Hemoglobin: 11.7 g/dL — ABNORMAL LOW (ref 12.0–15.0)
MCH: 32 pg (ref 26.0–34.0)
MCHC: 33.1 g/dL (ref 30.0–36.0)
MCV: 96.7 fL (ref 80.0–100.0)
NRBC: 0 % (ref 0.0–0.2)
Platelets: 136 10*3/uL — ABNORMAL LOW (ref 150–400)
RBC: 3.66 MIL/uL — ABNORMAL LOW (ref 3.87–5.11)
RDW: 12.8 % (ref 11.5–15.5)
WBC: 5.1 10*3/uL (ref 4.0–10.5)

## 2017-12-05 LAB — MAGNESIUM: Magnesium: 1.6 mg/dL — ABNORMAL LOW (ref 1.7–2.4)

## 2017-12-05 MED ORDER — POTASSIUM CHLORIDE CRYS ER 20 MEQ PO TBCR
40.0000 meq | EXTENDED_RELEASE_TABLET | Freq: Every day | ORAL | Status: DC
  Administered 2017-12-05: 40 meq via ORAL
  Filled 2017-12-05: qty 2

## 2017-12-05 MED ORDER — MAGNESIUM SULFATE 2 GM/50ML IV SOLN
2.0000 g | Freq: Once | INTRAVENOUS | Status: AC
  Administered 2017-12-05: 2 g via INTRAVENOUS
  Filled 2017-12-05: qty 50

## 2017-12-05 NOTE — Discharge Planning (Signed)
Patient is tolerating advanced diet well - no N/V or increased pain.  However, is struggling with pain from ambulation and activity.  Tramadol given (but did not reduce discomfort), so dilaudid was given to assist.   Patient resting comfortably.

## 2017-12-05 NOTE — Anesthesia Postprocedure Evaluation (Signed)
Anesthesia Post Note  Patient: Rolland Porter  Procedure(s) Performed: PARTIAL PROCTECTOMY REMOVAL OF RECTAL MASS BY TEM ERAS PATHWAY (N/A )     Patient location during evaluation: PACU Anesthesia Type: General Level of consciousness: awake and alert, awake and oriented Pain management: pain level controlled Vital Signs Assessment: post-procedure vital signs reviewed and stable Respiratory status: spontaneous breathing, nonlabored ventilation and respiratory function stable Cardiovascular status: blood pressure returned to baseline and stable Postop Assessment: no apparent nausea or vomiting Anesthetic complications: no    Last Vitals:  Vitals:   12/05/17 0946 12/05/17 1432  BP: (!) 147/72 129/65  Pulse: 79 91  Resp: 16 15  Temp: 37 C 37.1 C  SpO2: 97% 92%    Last Pain:  Vitals:   12/05/17 1432  TempSrc: Oral  PainSc:                  Catalina Gravel

## 2017-12-05 NOTE — Progress Notes (Addendum)
[place]a [place] [id number] 1965-01-25  CARE TEAM:  PCP: [name], MD  Outpatient Care Team: Patient Care Team: [name], MD as PCP - General (Endocrinology) Michael [place], MD as Consulting Physician (General Surgery) Ronald Lobo, MD as Consulting Physician (Gastroenterology) Sonia Baller, MD as Referring Physician (Neurology)  Inpatient Treatment Team: Treatment Team: Attending Provider: Michael [place], MD; Technician: Leda Quail, NT; Registered Nurse: Marena Chancy, RN; Technician: Sharren Bridge, NT   Problem List:   Principal Problem:   Adenomatous rectal polyp s/p TEM partial proctectomy 12/04/2017 Active Problems:   Epilepsy ([place])   Essential tremor   HLD (hyperlipidemia)   1 Day Post-Op  12/04/2017  Procedure(s): PARTIAL PROCTECTOMY REMOVAL OF RECTAL MASS BY TEM [facility] Stay = 0 days  Assessment  Recovering  Plan:  -Solid diet.  Stop IV fluids.  Continue aggressive mobilization per protocol.  Urinating fine without Foley.  Hypokalemia.  Replace.  Hypomagnesemia.  Replace.  Explained intraoperative findings.  VTE prophylaxis- SCDs, etc  Mobilize as tolerated to help recovery  D/C patient from hospital when patient meets criteria (anticipate in 0-1 day(s)):  Tolerating oral intake well Ambulating well Adequate pain control without IV medications Urinating  Having flatus Disposition planning in place   25 minutes spent in review, evaluation, examination, counseling, and coordination of care.  More than 50% of that time was spent in counseling.  12/05/2017    Subjective: (Chief complaint)  Mild pelvic cramping this morning.  Not severe.    Having flatus and bowel movements.  Had muscle weakness in recovery room which corrected with an hour after extra reversal medication from anesthesia.  Walking in hallways.  Denies nausea.    Objective:  Vital signs:  Vitals:   12/04/17  2037 12/04/17 2230 12/05/17 0212 12/05/17 0507  BP: (!) 148/75 132/74 126/68 (!) 147/79  Pulse: 85 78 70 73  Resp: '16 16 12 15  '$ Temp: 98.6 F (37 C) 98 F (36.7 C) 97.6 F (36.4 C) 98.2 F (36.8 C)  TempSrc: Oral Oral Axillary Oral  SpO2: 96% 97% 96% 95%  Weight:      Height:        Last BM Date: 12/05/17  Intake/Output   Yesterday:  11/12 0701 - 11/13 0700 In: 3607.6 [P.O.:480; I.V.:3077.6; IV Piggyback:50] Out: 2725 [Urine:2700; Blood:25] This shift:  No intake/output data recorded.  Bowel function:  Flatus: YES  BM:  YES  Drain: (No drain)   Physical Exam:  General: Pt awake/alert/oriented x4 in no acute distress Eyes: PERRL, normal EOM.  Sclera clear.  No icterus Neuro: CN II-XII intact w/o focal sensory/motor deficits. Lymph: No head/neck/groin lymphadenopathy Psych:  No delerium/psychosis/paranoia HENT: Normocephalic, Mucus membranes moist.  No thrush Neck: Supple, No tracheal deviation Chest: No chest wall pain w good excursion CV:  Pulses intact.  Regular rhythm MS: Normal AROM mjr joints.  No obvious deformity  Abdomen: Soft.  Nondistended.  Mildly tender at suprapubic region but no guarding.  No evidence of peritonitis.  No incarcerated hernias.  Rectal: Deferred Ext:  No deformity.  No mjr edema.  No cyanosis Skin: No petechiae / purpura  Results:   Labs: Results for orders placed or performed during the hospital encounter of 12/04/17 (from the past 48 hour(s))  Basic metabolic panel     Status: Abnormal   Collection Time: 12/05/17  3:34 AM  Result Value Ref Range   Sodium 141 135 - 145 mmol/L   Potassium 3.2 (L) 3.5 -  5.1 mmol/L   Chloride 107 98 - 111 mmol/L   CO2 27 22 - 32 mmol/L   Glucose, Bld 87 70 - 99 mg/dL   BUN <5 (L) 6 - 20 mg/dL   Creatinine, Ser 0.70 0.44 - 1.00 mg/dL   Calcium 8.4 (L) 8.9 - 10.3 mg/dL   GFR calc non Af Amer >60 >60 mL/min   GFR calc Af Amer >60 >60 mL/min    Comment: (NOTE) The eGFR has been calculated  using the CKD EPI equation. This calculation has not been validated in all clinical situations. eGFR's persistently <60 mL/min signify possible Chronic Kidney Disease.    Anion gap 7 5 - 15    Comment: Performed at [facility], [place] [address], [place], [place] [address]  CBC     Status: Abnormal   Collection Time: 12/05/17  3:34 AM  Result Value Ref Range   WBC 5.1 4.0 - 10.5 K/uL   RBC 3.66 (L) 3.87 - 5.11 MIL/uL   Hemoglobin 11.7 (L) 12.0 - 15.0 g/dL   HCT 35.4 (L) 36.0 - 46.0 %   MCV 96.7 80.0 - 100.0 fL   MCH 32.0 26.0 - 34.0 pg   MCHC 33.1 30.0 - 36.0 g/dL   RDW 12.8 11.5 - 15.5 %   Platelets 136 (L) 150 - 400 K/uL   nRBC 0.0 0.0 - 0.2 %    Comment: Performed at [facility], [place] [address], [place], [place] [address]  Magnesium     Status: Abnormal   Collection Time: 12/05/17  3:34 AM  Result Value Ref Range   Magnesium 1.6 (L) 1.7 - 2.4 mg/dL    Comment: Performed at [facility], [place] [address], [place], [place] [address]    Imaging / Studies: No results found.  Medications / Allergies: per chart  Antibiotics: Anti-infectives (From admission, onward)   Start     Dose/Rate Route Frequency Ordered Stop   12/04/17 2200  clindamycin (CLEOCIN) IVPB 900 mg     900 mg 100 mL/hr over 30 Minutes Intravenous Every 8 hours 12/04/17 1841 12/04/17 2232   12/04/17 1400  neomycin (MYCIFRADIN) tablet 1,000 mg  Status:  Discontinued     1,000 mg Oral 3 times per day 12/04/17 1107 12/04/17 1839   12/04/17 1400  metroNIDAZOLE (FLAGYL) tablet 1,000 mg  Status:  Discontinued     1,000 mg Oral 3 times per day 12/04/17 1107 12/04/17 1839   12/04/17 1201  clindamycin (CLEOCIN) 900 MG/50ML IVPB    Note to Pharmacy:  Georgena Spurling   : cabinet override      12/04/17 1201 12/04/17 1330   12/04/17 1115  clindamycin (CLEOCIN) 900 mg, gentamicin (GARAMYCIN) 240 mg in sodium chloride 0.9 % 1,000 mL for intraperitoneal lavage  Status:   Discontinued      Intraperitoneal To Surgery 12/04/17 1107 12/04/17 1839   12/04/17 0600  gentamicin (GARAMYCIN) 290 mg in dextrose 5 % 100 mL IVPB     5 mg/kg  58.3 kg (Adjusted) 107.3 mL/hr over 60 Minutes Intravenous 60 min pre-op 12/03/17 0905 12/04/17 1455   12/03/17 0905  clindamycin (CLEOCIN) IVPB 900 mg     900 mg 100 mL/hr over 30 Minutes Intravenous 60 min pre-op 12/03/17 0905 12/04/17 1330        Note: Portions of this report may have been transcribed using voice recognition software. Every effort was made to ensure accuracy; however, inadvertent computerized transcription errors may be present.  Any transcriptional errors that result from this process are unintentional.     Adin Hector, MD, FACS, MASCRS Gastrointestinal and Minimally Invasive Surgery    [address] [place], [place] [address] [phone] Main / Paging [phone] Fax

## 2017-12-06 DIAGNOSIS — Z79818 Long term (current) use of other agents affecting estrogen receptors and estrogen levels: Secondary | ICD-10-CM | POA: Diagnosis not present

## 2017-12-06 DIAGNOSIS — E785 Hyperlipidemia, unspecified: Secondary | ICD-10-CM | POA: Diagnosis present

## 2017-12-06 DIAGNOSIS — Z880 Allergy status to penicillin: Secondary | ICD-10-CM | POA: Diagnosis not present

## 2017-12-06 DIAGNOSIS — Z888 Allergy status to other drugs, medicaments and biological substances status: Secondary | ICD-10-CM | POA: Diagnosis not present

## 2017-12-06 DIAGNOSIS — E876 Hypokalemia: Secondary | ICD-10-CM | POA: Diagnosis present

## 2017-12-06 DIAGNOSIS — Z8673 Personal history of transient ischemic attack (TIA), and cerebral infarction without residual deficits: Secondary | ICD-10-CM | POA: Diagnosis not present

## 2017-12-06 DIAGNOSIS — Z7982 Long term (current) use of aspirin: Secondary | ICD-10-CM | POA: Diagnosis not present

## 2017-12-06 DIAGNOSIS — K621 Rectal polyp: Secondary | ICD-10-CM | POA: Diagnosis present

## 2017-12-06 DIAGNOSIS — Z79899 Other long term (current) drug therapy: Secondary | ICD-10-CM | POA: Diagnosis not present

## 2017-12-06 DIAGNOSIS — Z96659 Presence of unspecified artificial knee joint: Secondary | ICD-10-CM | POA: Diagnosis present

## 2017-12-06 DIAGNOSIS — Z800 Family history of malignant neoplasm of digestive organs: Secondary | ICD-10-CM | POA: Diagnosis not present

## 2017-12-06 DIAGNOSIS — G40909 Epilepsy, unspecified, not intractable, without status epilepticus: Secondary | ICD-10-CM | POA: Diagnosis present

## 2017-12-06 DIAGNOSIS — E039 Hypothyroidism, unspecified: Secondary | ICD-10-CM | POA: Diagnosis present

## 2017-12-06 DIAGNOSIS — G250 Essential tremor: Secondary | ICD-10-CM | POA: Diagnosis present

## 2017-12-06 MED FILL — traMADol HCL 50 MG TABS: 50 | 3 days supply | Qty: 20 | Fill #0

## 2017-12-06 NOTE — Discharge Summary (Signed)
Physician Discharge Summary    Patient ID: Alyssa Mccarthy MRN: [id number] DOB/AGE: August 16, 1965  52 y.o.  Admit date: 12/04/2017 Discharge date: 12/06/2017   Hospital Stay = 0 days  Patient Care Team: [name], MD as PCP - General (Endocrinology) Michael Boston, MD as Consulting Physician (General Surgery) Ronald Lobo, MD as Consulting Physician (Gastroenterology) Sonia Baller, MD as Referring Physician (Neurology)  Discharge Diagnoses:  Principal Problem:   Adenomatous rectal polyp s/p TEM partial proctectomy 12/04/2017 Active Problems:   Epilepsy ([place])   Essential tremor   HLD (hyperlipidemia)   Hypokalemia   Hypomagnesemia   2 Days Post-Op  12/04/2017  POST-OPERATIVE DIAGNOSIS:   Polyp in rectum  SURGERY:  12/04/2017  Procedure(s): PARTIAL PROCTECTOMY REMOVAL OF RECTAL MASS BY TEM ERAS PATHWAY  SURGEON:    Surgeon(s): Michael Boston, MD  Consults: None  Hospital Course:   Patient with bulky mid rectal polyp over a fold felt not amenable to endoscopic resection.  Underwent TEM partial proctectomy the day of admission.  Postoperatively, the patient gradually mobilized and advanced to a solid diet.  He initially had some lower abdominal cramping.  Pain and other symptoms were treated aggressively.    By the time of discharge, the patient was walking well the hallways, eating food, having flatus.  Pain was minimal and well-controlled on an oral medications.  Based on meeting discharge criteria and continuing to recover, I felt it was safe for the patient to be discharged from the hospital to further recover with close followup. Postoperative recommendations were discussed in detail.  They are written as well.  Discharged Condition: good  Disposition:  Follow-up Information    Michael Boston, MD. Schedule an appointment as soon as possible for a visit in 2 weeks.   Specialty:  General Surgery Why:  To follow up after your operation, To follow up  after your hospital stay Contact information: [place] [address] [phone]           Discharge disposition: 01-Home or Self Care       Discharge Instructions    Call MD for:   Complete by:  As directed    FEVER > 101.5 F  (temperatures < 101.5 F are not significant)   Call MD for:  extreme fatigue   Complete by:  As directed    Call MD for:  persistant dizziness or light-headedness   Complete by:  As directed    Call MD for:  persistant nausea and vomiting   Complete by:  As directed    Call MD for:  redness, tenderness, or signs of infection (pain, swelling, redness, odor or green/yellow discharge around incision site)   Complete by:  As directed    Call MD for:  severe uncontrolled pain   Complete by:  As directed    Diet - low sodium heart healthy   Complete by:  As directed    Start with a bland diet such as soups, liquids, starchy foods, low fat foods, etc. the first few days at home. Gradually advance to a solid, low-fat, high fiber diet by the end of the first week at home.   Add a fiber supplement to your diet (Metamucil, etc) If you feel full, bloated, or constipated, stay on a full liquid or pureed/blenderized diet for a few days until you feel better and are no longer constipated.   Discharge instructions   Complete by:  As directed    See Discharge Instructions  If you are not getting better after two weeks or are noticing you are getting worse, contact our office [phone] for further advice.  We may need to adjust your medications, re-evaluate you in the office, send you to the emergency room, or see what other things we can do to help. The clinic staff is available to answer your questions during regular business hours (8:30am-5pm).  Please don't hesitate to call and ask to speak to one of our nurses for clinical concerns.    A surgeon from [facility] is always on call at the hospitals 24 hours/day If you  have a medical emergency, go to the nearest emergency room or call 911.   Discharge wound care:   Complete by:  As directed    Please keep the perianal region clean and dry.  Warm water soaks are helpful.  Gauze/pad/powders as needed.   Driving Restrictions   Complete by:  As directed    You may drive when: - you are no longer taking narcotic prescription pain medication - you can comfortably wear a seatbelt - you can safely make sudden turns/stops without pain.   Increase activity slowly   Complete by:  As directed    Start light daily activities --- self-care, walking, climbing stairs- beginning the day after surgery.  Gradually increase activities as tolerated.  Control your pain to be active.  Stop when you are tired.  Ideally, walk several times a day, eventually an hour a day.   Most people are back to most day-to-day activities in a few weeks.  It takes 4-6 weeks to get back to unrestricted, intense activity. If you can walk 30 minutes without difficulty, it is safe to try more intense activity such as jogging, treadmill, bicycling, low-impact aerobics, swimming, etc. Save the most intensive and strenuous activity for last (Usually 4-8 weeks after surgery) such as sit-ups, heavy lifting, contact sports, etc.  Refrain from any intense heavy lifting or straining until you are off narcotics for pain control.  You will have off days, but things should improve week-by-week. DO NOT PUSH THROUGH PAIN.  Let pain be your guide: If it hurts to do something, don't do it.   Lifting restrictions   Complete by:  As directed    If you can walk 30 minutes without difficulty, it is safe to try more intense activity such as jogging, treadmill, bicycling, low-impact aerobics, swimming, etc. Save the most intensive and strenuous activity for last (Usually 4-8 weeks after surgery) such as sit-ups, heavy lifting, contact sports, etc.   Refrain from any intense heavy lifting or straining until you are off  narcotics for pain control.  You will have off days, but things should improve week-by-week. DO NOT PUSH THROUGH PAIN.  Let pain be your guide: If it hurts to do something, don't do it.  Pain is your body warning you to avoid that activity for another week until the pain goes down.   May shower / Bathe   Complete by:  As directed    May walk up steps   Complete by:  As directed    Sexual Activity Restrictions   Complete by:  As directed    You may have sexual intercourse when it is comfortable. If it hurts to do something, stop.      Allergies as of 12/06/2017      Reactions   Gluten Meal Diarrhea, Other (See Comments)   Severe diarrhea   Other Other (See Comments)  Bleach-respiratory distress   Plavix [clopidogrel Bisulfate] Itching, Rash, Other (See Comments)   Hands and feet    Protonix [pantoprazole] Itching, Other (See Comments)   Whelps   Penicillins Itching, Rash, Other (See Comments)   Childhood allergy- specifics unknown; tolerated amoxicillin and ancef since then       Medication List    TAKE these medications   aspirin 325 MG EC tablet Take 1 tablet (325 mg total) by mouth daily.   atorvastatin 40 MG tablet Commonly known as:  LIPITOR Take 1 tablet (40 mg total) by mouth every evening.   clonazePAM 0.5 MG tablet Commonly known as:  KLONOPIN Take 0.5 mg by mouth 2 (two) times daily as needed (for seizures).   divalproex 500 MG 24 hr tablet Commonly known as:  DEPAKOTE ER Take 1 tablet (500 mg total) by mouth at bedtime. What changed:  when to take this   ezetimibe 10 MG tablet Commonly known as:  ZETIA Take 10 mg by mouth daily.   lamoTRIgine 100 MG tablet Commonly known as:  LAMICTAL Take 1 tablet (100 mg total) by mouth 2 (two) times daily.   levETIRAcetam 500 MG 24 hr tablet Commonly known as:  KEPPRA XR Take 5 tablets (2,500 mg total) by mouth daily.   levothyroxine 88 MCG tablet Commonly known as:  SYNTHROID, LEVOTHROID Take 88 mcg by mouth  daily before breakfast.   Melatonin 5 MG Tabs Take 15 mg by mouth as needed.   OMEGA 3 PO Take 1 capsule by mouth daily.   primidone 50 MG tablet Commonly known as:  MYSOLINE Take 100-150 mg by mouth See admin instructions. Take 100 mg by mouth in the morning and take 150 mg by mouth in the evening   sertraline 50 MG tablet Commonly known as:  ZOLOFT Take 3 tablets daily What changed:    how much to take  how to take this  when to take this  additional instructions   traMADol 50 MG tablet Commonly known as:  ULTRAM Take 1-2 tablets (50-100 mg total) by mouth every 6 (six) hours as needed for moderate pain or severe pain.   zolpidem 10 MG tablet Commonly known as:  AMBIEN Take 1 tablet (10 mg total) by mouth at bedtime.            Discharge Care Instructions  (From admission, onward)         Start     Ordered   12/04/17 0000  Discharge wound care:    Comments:  Please keep the perianal region clean and dry.  Warm water soaks are helpful.  Gauze/pad/powders as needed.   12/04/17 1540          Significant Diagnostic Studies:  Results for orders placed or performed during the hospital encounter of 12/04/17 (from the past 72 hour(s))  Basic metabolic panel     Status: Abnormal   Collection Time: 12/05/17  3:34 AM  Result Value Ref Range   Sodium 141 135 - 145 mmol/L   Potassium 3.2 (L) 3.5 - 5.1 mmol/L   Chloride 107 98 - 111 mmol/L   CO2 27 22 - 32 mmol/L   Glucose, Bld 87 70 - 99 mg/dL   BUN <5 (L) 6 - 20 mg/dL   Creatinine, Ser 0.70 0.44 - 1.00 mg/dL   Calcium 8.4 (L) 8.9 - 10.3 mg/dL   GFR calc non Af Amer >60 >60 mL/min   GFR calc Af Amer >60 >60 mL/min    Comment: (  NOTE) The eGFR has been calculated using the CKD EPI equation. This calculation has not been validated in all clinical situations. eGFR's persistently <60 mL/min signify possible Chronic Kidney Disease.    Anion gap 7 5 - 15    Comment: Performed at [facility],  [place] [address], [place], [place] [address]  CBC     Status: Abnormal   Collection Time: 12/05/17  3:34 AM  Result Value Ref Range   WBC 5.1 4.0 - 10.5 K/uL   RBC 3.66 (L) 3.87 - 5.11 MIL/uL   Hemoglobin 11.7 (L) 12.0 - 15.0 g/dL   HCT 35.4 (L) 36.0 - 46.0 %   MCV 96.7 80.0 - 100.0 fL   MCH 32.0 26.0 - 34.0 pg   MCHC 33.1 30.0 - 36.0 g/dL   RDW 12.8 11.5 - 15.5 %   Platelets 136 (L) 150 - 400 K/uL   nRBC 0.0 0.0 - 0.2 %    Comment: Performed at [facility], [place] [address] [place], [place] [address]  Magnesium     Status: Abnormal   Collection Time: 12/05/17  3:34 AM  Result Value Ref Range   Magnesium 1.6 (L) 1.7 - 2.4 mg/dL    Comment: Performed at [facility], [place] [address], [place],  [address]    No results found.  Discharge Exam: Blood pressure 111/63, pulse 67, temperature 98.1 F (36.7 C), temperature source Oral, resp. rate 20, height _0  (1.575 m), weight 71.6 kg, SpO2 95 %.  General: Pt awake/alert/oriented x4 in No acute distress Eyes: PERRL, normal EOM.  Sclera clear.  No icterus Neuro: CN II-XII intact w/o focal sensory/motor deficits. Lymph: No head/neck/groin lymphadenopathy Psych:  No delerium/psychosis/paranoia HENT: Normocephalic, Mucus membranes moist.  No thrush Neck: Supple, No tracheal deviation Chest: No chest wall pain w good excursion CV:  Pulses intact.  Regular rhythm MS: Normal AROM mjr joints.  No obvious deformity Abdomen: Soft.  Nondistended.  Nontender.  No evidence of peritonitis.  No incarcerated hernias. Ext:  SCDs BLE.  No mjr edema.  No cyanosis Skin: No petechiae / purpura  Past Medical History:  Diagnosis Date  . Complication of anesthesia   . Essential tremor   . Hyperlipidemia   . Hypothyroidism   . PONV (postoperative nausea and vomiting)   . Seizure disorder ([place])   . Stroke [facility])     Past Surgical History:  Procedure Laterality Date  . ABDOMINAL HYSTERECTOMY    .  APPENDECTOMY    . BRAIN SURGERY  2015   deep brain stimulator implants bilaterally  . CARPAL TUNNEL RELEASE    . PARTIAL PROCTECTOMY BY TEM N/A 12/04/2017   Procedure: PARTIAL PROCTECTOMY REMOVAL OF RECTAL MASS BY TEM ERAS PATHWAY;  Surgeon: Michael Boston, MD;  Location: [facility] ORS;  Service: General;  Laterality: N/A;  . TOTAL KNEE ARTHROPLASTY      Social History   Socioeconomic History  . Marital status: Married    Spouse name: Not on file  . Number of children: Not on file  . Years of education: Not on file  . Highest education level: Not on file  Occupational History  . Not on file  Social Needs  . Financial resource strain: Not on file  . Food insecurity:    Worry: Not on file    Inability: Not on file  . Transportation needs:    Medical: Not on file    Non-medical: Not on file  Tobacco Use  .  Smoking status: Never Smoker  . Smokeless tobacco: Never Used  Substance and Sexual Activity  . Alcohol use: Yes    Comment: socially  . Drug use: No  . Sexual activity: Not on file  Lifestyle  . Physical activity:    Days per week: Not on file    Minutes per session: Not on file  . Stress: Not on file  Relationships  . Social connections:    Talks on phone: Not on file    Gets together: Not on file    Attends religious service: Not on file    Active member of club or organization: Not on file    Attends meetings of clubs or organizations: Not on file    Relationship status: Not on file  . Intimate partner violence:    Fear of current or ex partner: Not on file    Emotionally abused: Not on file    Physically abused: Not on file    Forced sexual activity: Not on file  Other Topics Concern  . Not on file  Social History Narrative   Lives in 1 story home with her husband and 2 sons   Has 2 adult sons   Has bach. Degree   Works as Designer, jewellery    Family History  Problem Relation Age of Onset  . Heart attack Father 19  . Stroke Neg Hx   . Breast cancer Neg  Hx     Current Facility-Administered Medications  Medication Dose Route Frequency Provider Last Rate Last Dose  . 0.9 %  sodium chloride infusion  1,000 mL Intravenous Q8H PRN Michael Boston, MD      . acetaminophen (TYLENOL) tablet 1,000 mg  1,000 mg Oral Lajuana Ripple, MD   1,000 mg at 12/06/17 0546  . alum & mag hydroxide-simeth (MAALOX/MYLANTA) 200-200-20 MG/5ML suspension 30 mL  30 mL Oral Q6H PRN Michael Boston, MD      . alvimopan (ENTEREG) capsule 12 mg  12 mg Oral BID Michael Boston, MD   12 mg at 12/05/17 2255  . aspirin EC tablet 325 mg  325 mg Oral Daily Michael Boston, MD   325 mg at 12/05/17 0930  . atorvastatin (LIPITOR) tablet 40 mg  40 mg Oral QPM Michael Boston, MD   40 mg at 12/05/17 1707  . bismuth subsalicylate (PEPTO BISMOL) 262 MG/15ML suspension 30 mL  30 mL Oral Q8H PRN Michael Boston, MD      . clonazePAM Bobbye Charleston) tablet 0.5 mg  0.5 mg Oral BID PRN Michael Boston, MD      . diphenhydrAMINE (BENADRYL) 12.5 MG/5ML elixir 12.5 mg  12.5 mg Oral Q6H PRN Michael Boston, MD       Or  . diphenhydrAMINE (BENADRYL) injection 12.5 mg  12.5 mg Intravenous Q6H PRN Michael Boston, MD      . divalproex (DEPAKOTE ER) 24 hr tablet 500 mg  500 mg Oral QPM Michael Boston, MD   500 mg at 12/05/17 1707  . enoxaparin (LOVENOX) injection 40 mg  40 mg Subcutaneous Q24H Michael Boston, MD   40 mg at 12/05/17 0830  . ezetimibe (ZETIA) tablet 10 mg  10 mg Oral Daily Michael Boston, MD   10 mg at 12/05/17 0930  . feeding supplement (ENSURE SURGERY) liquid 237 mL  237 mL Oral BID BM Michael Boston, MD      . gabapentin (NEURONTIN) capsule 300 mg  300 mg Oral BID Michael Boston, MD   300 mg at 12/05/17 2255  .  hydrALAZINE (APRESOLINE) injection 10 mg  10 mg Intravenous Q2H PRN Michael Boston, MD      . HYDROmorphone (DILAUDID) injection 0.5-2 mg  0.5-2 mg Intravenous Q4H PRN Michael Boston, MD   1 mg at 12/05/17 1214  . lamoTRIgine (LAMICTAL) tablet 100 mg  100 mg Oral BID Michael Boston, MD   100 mg at  12/05/17 2255  . levETIRAcetam (KEPPRA XR) 24 hr tablet 2,500 mg  2,500 mg Oral Daily Michael Boston, MD   2,500 mg at 12/05/17 0931  . levothyroxine (SYNTHROID, LEVOTHROID) tablet 88 mcg  88 mcg Oral QAC breakfast Michael Boston, MD   88 mcg at 12/06/17 0546  . lip balm (CARMEX) ointment 1 application  1 application Topical BID Michael Boston, MD   1 application at 29/79/89 2254  . magic mouthwash  15 mL Oral QID PRN Michael Boston, MD      . metoCLOPramide (REGLAN) injection 10 mg  10 mg Intravenous Q6H PRN Michael Boston, MD      . metoprolol tartrate (LOPRESSOR) injection 5 mg  5 mg Intravenous Q6H PRN Michael Boston, MD      . ondansetron [facility]) tablet 4 mg  4 mg Oral Q6H PRN Michael Boston, MD       Or  . ondansetron (ZOFRAN) injection 4 mg  4 mg Intravenous Q6H PRN Michael Boston, MD      . potassium chloride SA (K-DUR,KLOR-CON) CR tablet 40 mEq  40 mEq Oral Daily Michael Boston, MD   40 mEq at 12/05/17 0930  . primidone (MYSOLINE) tablet 100 mg  100 mg Oral Daily Michael Boston, MD   100 mg at 12/05/17 0931  . primidone (MYSOLINE) tablet 150 mg  150 mg Oral Ardeen Fillers, MD   150 mg at 12/05/17 2255  . prochlorperazine (COMPAZINE) tablet 10 mg  10 mg Oral Q6H PRN Michael Boston, MD       Or  . prochlorperazine (COMPAZINE) injection 5-10 mg  5-10 mg Intravenous Q6H PRN Michael Boston, MD      . psyllium (HYDROCIL/METAMUCIL) packet 1 packet  1 packet Oral Daily Michael Boston, MD   1 packet at 12/05/17 0930  . saccharomyces boulardii (FLORASTOR) capsule 250 mg  250 mg Oral BID Michael Boston, MD   250 mg at 12/05/17 2306  . sertraline (ZOLOFT) tablet 100 mg  100 mg Oral Daily Michael Boston, MD   100 mg at 12/05/17 0930  . sertraline (ZOLOFT) tablet 50 mg  50 mg Oral Ardeen Fillers, MD   50 mg at 12/05/17 2255  . traMADol (ULTRAM) tablet 50-100 mg  50-100 mg Oral Q6H PRN Michael Boston, MD   100 mg at 12/05/17 2015  . zolpidem (AMBIEN) tablet 5 mg  5 mg Oral Ardeen Fillers, MD   5 mg at  12/05/17 2255     Allergies  Allergen Reactions  . Gluten Meal Diarrhea and Other (See Comments)    Severe diarrhea  . Other Other (See Comments)    Bleach-respiratory distress  . Plavix [Clopidogrel Bisulfate] Itching, Rash and Other (See Comments)    Hands and feet   . Protonix [Pantoprazole] Itching and Other (See Comments)    Whelps  . Penicillins Itching, Rash and Other (See Comments)    Childhood allergy- specifics unknown; tolerated amoxicillin and ancef since then     Signed: Morton Peters, MD, FACS, MASCRS Gastrointestinal and Minimally Invasive Surgery    [address], [place],  [place] [address] [phone] Main / Paging [phone] Fax   12/06/2017, 7:36 AM

## 2017-12-06 NOTE — Progress Notes (Signed)
Discharge amd medication instructions reviewed with patient. Questions answered; patient denies further questions. One prescription given to patient. Family member is en route to drive patient home. Donne Hazel, RN

## 2017-12-12 ENCOUNTER — Encounter: Payer: Self-pay | Admitting: *Deleted

## 2017-12-12 ENCOUNTER — Other Ambulatory Visit: Payer: Self-pay | Admitting: *Deleted

## 2017-12-12 NOTE — Patient Outreach (Addendum)
[facility]) Care Management  12/12/2017  Alyssa Mccarthy 02/04/1965 [id number]   Subjective: Telephone call to patient's home / mobile number, spoke with patient, and HIPAA verified.  Discussed [facility] Care Management [facility] Transition of care follow up, patient voiced understanding, and is in agreement to follow up.   Patient states she is very sore, having some nausea, has taken medication for nausea, will call surgeon if nausea has not resolved by 12/13/17, and has follow up appointment with surgeon on 01/01/18.  Patient states she is able to manage self care and has assistance as needed.  Patient voices understanding of medical diagnosis, surgery, and treatment plan. States she is accessing the following [facility] benefits: outpatient pharmacy, hospital indemnity, and has family medical leave act (FMLA) in place.   RNCM will email ([email or web address]) patient contact information per her request : for [name] [phone] and [facility] Patient Accounting [phone] to request itemized bill.   Patient states she does not have any education material, transition of care, care coordination, disease management, disease monitoring, transportation, community resource, or pharmacy needs at this time.  States she is very appreciative of the follow up and is in agreement to receive [place] Management information.       Objective: Per KPN (Knowledge Performance Now, point of care tool) and chart review, patient hospitalized 12/04/17 -12/06/17 for  Adenomatous rectal polyp , status post PARTIAL PROCTECTOMY REMOVAL OF RECTAL MASS BY TEM ERAS PATHWAY on 12/04/17.   Patient also has a history of essential tremors, epilepsy, hyperlipidemia, and CVA ((?thrombotic).       Assessment: Received [facility] Transition of care referral on 12/07/17.   Transition of care follow up completed, no care management needs, and will proceed with case closure.       Plan: RNCM will send patient successful outreach  letter, [facility] pamphlet, and magnet. RNCM will complete case closure due to follow up completed / no care management needs.  RNCM will email patient resources contact information per her request.       Mariane Burpee H. Annia Friendly, BSN, [facility] Management [facility] Telephonic CM Phone: [phone] Fax: [phone]

## 2017-12-13 ENCOUNTER — Emergency Department (HOSPITAL_COMMUNITY): Payer: 59

## 2017-12-13 ENCOUNTER — Encounter (HOSPITAL_COMMUNITY): Payer: Self-pay | Admitting: Radiology

## 2017-12-13 ENCOUNTER — Emergency Department (HOSPITAL_COMMUNITY)
Admission: EM | Admit: 2017-12-13 | Discharge: 2017-12-13 | Disposition: A | Discharge status: Home / Self Care | Payer: 59 | Attending: Emergency Medicine | Admitting: Emergency Medicine

## 2017-12-13 DIAGNOSIS — D3501 Benign neoplasm of right adrenal gland: Secondary | ICD-10-CM | POA: Diagnosis not present

## 2017-12-13 DIAGNOSIS — A0472 Enterocolitis due to Clostridium difficile, not specified as recurrent: Secondary | ICD-10-CM | POA: Diagnosis not present

## 2017-12-13 DIAGNOSIS — R109 Unspecified abdominal pain: Secondary | ICD-10-CM | POA: Diagnosis not present

## 2017-12-13 DIAGNOSIS — R197 Diarrhea, unspecified: Secondary | ICD-10-CM | POA: Diagnosis present

## 2017-12-13 LAB — CBC WITH DIFFERENTIAL/PLATELET
Abs Immature Granulocytes: 0.02 10*3/uL (ref 0.00–0.07)
BASOS PCT: 1 %
Basophils Absolute: 0 10*3/uL (ref 0.0–0.1)
EOS ABS: 0 10*3/uL (ref 0.0–0.5)
EOS PCT: 1 %
HEMATOCRIT: 38.7 % (ref 36.0–46.0)
Hemoglobin: 12.9 g/dL (ref 12.0–15.0)
Immature Granulocytes: 0 %
LYMPHS ABS: 1 10*3/uL (ref 0.7–4.0)
Lymphocytes Relative: 13 %
MCH: 32.7 pg (ref 26.0–34.0)
MCHC: 33.3 g/dL (ref 30.0–36.0)
MCV: 98 fL (ref 80.0–100.0)
Monocytes Absolute: 0.7 10*3/uL (ref 0.1–1.0)
Monocytes Relative: 10 %
Neutro Abs: 5.5 10*3/uL (ref 1.7–7.7)
Neutrophils Relative %: 75 %
PLATELETS: 145 10*3/uL — AB (ref 150–400)
RBC: 3.95 MIL/uL (ref 3.87–5.11)
RDW: 13 % (ref 11.5–15.5)
WBC: 7.2 10*3/uL (ref 4.0–10.5)
nRBC: 0 % (ref 0.0–0.2)

## 2017-12-13 LAB — URINALYSIS, ROUTINE W REFLEX MICROSCOPIC
BILIRUBIN URINE: NEGATIVE
GLUCOSE, UA: NEGATIVE mg/dL
Hgb urine dipstick: NEGATIVE
KETONES UR: 5 mg/dL — AB
Leukocytes, UA: NEGATIVE
NITRITE: NEGATIVE
PH: 5 (ref 5.0–8.0)
Protein, ur: NEGATIVE mg/dL
Specific Gravity, Urine: 1.005 (ref 1.005–1.030)

## 2017-12-13 LAB — BASIC METABOLIC PANEL
ANION GAP: 9 (ref 5–15)
BUN: 12 mg/dL (ref 6–20)
CALCIUM: 9.2 mg/dL (ref 8.9–10.3)
CO2: 29 mmol/L (ref 22–32)
Chloride: 97 mmol/L — ABNORMAL LOW (ref 98–111)
Creatinine, Ser: 0.78 mg/dL (ref 0.44–1.00)
GFR calc Af Amer: >60 mL/min (ref >60)
Glucose, Bld: 127 mg/dL — ABNORMAL HIGH (ref 70–99)
POTASSIUM: 3.9 mmol/L (ref 3.5–5.1)
Sodium: 135 mmol/L (ref 135–145)

## 2017-12-13 LAB — C DIFFICILE QUICK SCREEN W PCR REFLEX
C DIFFICLE (CDIFF) ANTIGEN: POSITIVE — AB
C Diff interpretation: DETECTED
C Diff toxin: POSITIVE — AB

## 2017-12-13 MED ORDER — SODIUM CHLORIDE 0.9 % IV BOLUS
1000.0000 mL | Freq: Once | INTRAVENOUS | Status: AC
  Administered 2017-12-13: 1000 mL via INTRAVENOUS

## 2017-12-13 MED ORDER — VANCOMYCIN HCL 125 MG PO CAPS: 125.0000 mg | ORAL_CAPSULE | Freq: Four times a day (QID) | ORAL | 0 refills | Status: AC

## 2017-12-13 MED ORDER — FENTANYL CITRATE (PF) 100 MCG/2ML IJ SOLN
100.0000 ug | Freq: Once | INTRAMUSCULAR | Status: AC
  Administered 2017-12-13: 100 ug via INTRAVENOUS
  Filled 2017-12-13: qty 2

## 2017-12-13 MED ORDER — ONDANSETRON HCL 4 MG/2ML IJ SOLN
4.0000 mg | Freq: Once | INTRAMUSCULAR | Status: AC
  Administered 2017-12-13: 4 mg via INTRAVENOUS
  Filled 2017-12-13: qty 2

## 2017-12-13 MED ORDER — IOPAMIDOL (ISOVUE-300) INJECTION 61%
INTRAVENOUS | Status: AC
  Filled 2017-12-13: qty 100

## 2017-12-13 MED ORDER — VANCOMYCIN 50 MG/ML ORAL SOLUTION: 125.0000 mg | Freq: Four times a day (QID) | ORAL | Status: DC

## 2017-12-13 MED ORDER — IOPAMIDOL (ISOVUE-300) INJECTION 61%
100.0000 mL | Freq: Once | INTRAVENOUS | Status: AC | PRN
  Administered 2017-12-13: 100 mL via INTRAVENOUS

## 2017-12-13 MED ORDER — VANCOMYCIN 50 MG/ML ORAL SOLUTION
125.0000 mg | Freq: Once | ORAL | Status: AC
  Administered 2017-12-13: 125 mg via ORAL
  Filled 2017-12-13: qty 2.5

## 2017-12-13 MED ORDER — IOHEXOL 300 MG/ML  SOLN
30.0000 mL | Freq: Once | INTRAMUSCULAR | Status: AC | PRN
  Administered 2017-12-13: 30 mL via ORAL

## 2017-12-13 MED ORDER — PROMETHAZINE HCL 25 MG/ML IJ SOLN
12.5000 mg | Freq: Once | INTRAMUSCULAR | Status: AC
  Administered 2017-12-13: 12.5 mg via INTRAVENOUS
  Filled 2017-12-13: qty 1

## 2017-12-13 MED ORDER — SODIUM CHLORIDE (PF) 0.9 % IJ SOLN
INTRAMUSCULAR | Status: AC
  Filled 2017-12-13: qty 50

## 2017-12-13 MED FILL — VANCOMYCIN HCL 125 MG CAP: 125 | 10 days supply | Qty: 40 | Fill #0

## 2017-12-13 NOTE — ED Provider Notes (Signed)
I assumed care of this patient from Dr. Florina Ou at  [facility].  Please see their note for further details of Hx, PE.  Briefly patient is a 52 y.o. female who presented with abdominal pain, recent rectal surgery, awaiting CT scan/c diff. Normal labs thus far.   Patient positive for C. difficile.  Given oral vancomycin.  CT scan shows colitis without any complicating features.  No abscesses.  No megacolon.  Patient educated about C. difficile.  Recommend oral vancomycin at this time.  Recommend close follow-up with primary care doctor.  Told her return to ED if symptoms worsen and discharged from ED in good condition.  This chart was dictated using voice recognition software.  Despite best efforts to proofread,  errors can occur which can change the documentation meaning.      Lennice Sites, DO 12/13/17 1001

## 2017-12-13 NOTE — ED Triage Notes (Signed)
Pt reports fatigue, loose stools abd pain, nausea, vomiting, temp of 102F at 1600--temp 101F after tylenol. Pt is post-op rectal mass removal by Gross, MD.

## 2017-12-13 NOTE — ED Notes (Signed)
Bedside toilet provided for patient at bedside. No other identified needs at this time.

## 2017-12-13 NOTE — ED Provider Notes (Signed)
[place] DEPT Provider Note: Alyssa Spurling, MD, FACEP  CSN: [id number] MRN: [id number] ARRIVAL: 12/13/17 at [place]: [place]  Abdominal Pain   HISTORY OF PRESENT ILLNESS  12/13/17 3:57 AM Alyssa Mccarthy is a 52 y.o. female who had a partial proctectomy for a benign adenomatous rectal polyp removed by Dr. gross of [place] [facility] on the 12th of this month.  She awoke about 24 hours ago with general malaise, nausea, diarrhea and lower abdominal pain.  She describes the pain as having both sharp and twisting components and rates it as an 8 out of 10.  This pain is worse than any postoperative pain she had.  She has been able to control her nausea with Zofran enabling her to drink fluids throughout the day but she has had persistent diarrhea and pain.  She had a temperature as high as 102 at home which improved with Tylenol.  She describes her stools as watery and fibrous.  She also describes them as being foul-smelling.   Past Medical History:  Diagnosis Date  . Complication of anesthesia   . Essential tremor   . Hyperlipidemia   . Hypothyroidism   . PONV (postoperative nausea and vomiting)   . Seizure disorder ([place])   . Stroke [facility])     Past Surgical History:  Procedure Laterality Date  . ABDOMINAL HYSTERECTOMY    . APPENDECTOMY    . BRAIN SURGERY  2015   deep brain stimulator implants bilaterally  . CARPAL TUNNEL RELEASE    . PARTIAL PROCTECTOMY BY TEM N/A 12/04/2017   Procedure: PARTIAL PROCTECTOMY REMOVAL OF RECTAL MASS BY TEM ERAS PATHWAY;  Surgeon: Michael Boston, MD;  Location: [facility] ORS;  Service: General;  Laterality: N/A;  . TOTAL KNEE ARTHROPLASTY      Family History  Problem Relation Age of Onset  . Heart attack Father 11  . Stroke Neg Hx   . Breast cancer Neg Hx     Social History   Tobacco Use  . Smoking status: Never Smoker  . Smokeless tobacco: Never Used  Substance Use Topics  . Alcohol use: Yes    Comment: socially    . Drug use: No    Prior to Admission medications   Medication Sig Start Date End Date Taking? Authorizing Provider  aspirin EC 325 MG EC tablet Take 1 tablet (325 mg total) by mouth daily. 02/19/16  Yes Caren Griffins, MD  atorvastatin (LIPITOR) 40 MG tablet Take 1 tablet (40 mg total) by mouth every evening. 02/18/16  Yes Gherghe, Vella Redhead, MD  clonazePAM (KLONOPIN) 0.5 MG tablet Take 0.5 mg by mouth 2 (two) times daily as needed (for seizures).    Yes [provider]  divalproex (DEPAKOTE ER) 500 MG 24 hr tablet Take 1 tablet (500 mg total) by mouth at bedtime. Patient taking differently: Take 500 mg by mouth every evening.  07/30/17  Yes Cameron Sprang, MD  ezetimibe (ZETIA) 10 MG tablet Take 10 mg by mouth daily.  01/10/17  Yes [provider]  lamoTRIgine (LAMICTAL) 100 MG tablet Take 1 tablet (100 mg total) by mouth 2 (two) times daily. 07/30/17  Yes Cameron Sprang, MD  levETIRAcetam (KEPPRA XR) 500 MG 24 hr tablet Take 5 tablets (2,500 mg total) by mouth daily. 07/30/17  Yes Cameron Sprang, MD  levothyroxine (SYNTHROID, LEVOTHROID) 88 MCG tablet Take 88 mcg by mouth daily before breakfast.   Yes [provider]  Melatonin 5  MG TABS Take 15 mg by mouth as needed (sleep).    Yes [provider]  primidone (MYSOLINE) 50 MG tablet Take 100-150 mg by mouth See admin instructions. Take 100 mg by mouth in the morning and take 150 mg by mouth in the evening   Yes [provider]  propranolol ER (INDERAL LA) 60 MG 24 hr capsule Take 60 mg by mouth daily.  11/30/17  Yes [provider]  sertraline (ZOLOFT) 50 MG tablet Take 3 tablets daily Patient taking differently: Take 50-100 mg by mouth See admin instructions. Take 100 mg by mouth in the morning and take 50 mg by mouth in the evening 07/30/17  Yes Cameron Sprang, MD  traMADol (ULTRAM) 50 MG tablet Take 1-2 tablets (50-100 mg total) by mouth every 6 (six) hours as needed for moderate pain or  severe pain. 12/04/17  Yes Michael Boston, MD  zolpidem (AMBIEN) 10 MG tablet Take 1 tablet (10 mg total) by mouth at bedtime. 07/30/17  Yes Cameron Sprang, MD  Omega-3 Fatty Acids (OMEGA 3 PO) Take 1 capsule by mouth daily.    [provider]  vancomycin (VANCOCIN HCL) 125 MG capsule Take 1 capsule (125 mg total) by mouth 4 (four) times daily for 10 days. 12/13/17 12/23/17  Curatolo, Adam, DO    Allergies Gluten meal; Other; Plavix [clopidogrel bisulfate]; Protonix [pantoprazole]; and Penicillins   REVIEW OF SYSTEMS  Negative except as noted here or in the History of Present Illness.   PHYSICAL EXAMINATION  Initial Vital Signs Blood pressure (!) 111/58, pulse 84, temperature 98.8 F (37.1 C), temperature source Oral, resp. rate 13, height 5\' 2"  (1.575 m), weight 69.9 kg, SpO2 95 %.  Examination General: Well-developed, well-nourished female in no acute distress; appearance consistent with age of record HENT: normocephalic; atraumatic Eyes: pupils equal, round and reactive to light; extraocular muscles intact Neck: supple Heart: regular rate and rhythm Lungs: clear to auscultation bilaterally Abdomen: soft; nondistended; lower abdominal tenderness most prominent in the right lower quadrant; no masses or hepatosplenomegaly; bowel sounds present Extremities: No deformity; full range of motion; pulses normal Neurologic: Awake, alert and oriented; motor function intact in all extremities and symmetric; no facial droop Skin: Warm and dry Psychiatric: Flat affect   RESULTS  Summary of this visit's results, reviewed by myself:   EKG Interpretation  Date/Time:    Ventricular Rate:    PR Interval:    QRS Duration:   QT Interval:    QTC Calculation:   R Axis:     Text Interpretation:        Laboratory Studies: Results for orders placed or performed during the hospital encounter of 12/13/17 (from the past 24 hour(s))  CBC with Differential/Platelet     Status: Abnormal     Collection Time: 12/13/17  4:24 AM  Result Value Ref Range   WBC 7.2 4.0 - 10.5 K/uL   RBC 3.95 3.87 - 5.11 MIL/uL   Hemoglobin 12.9 12.0 - 15.0 g/dL   HCT 38.7 36.0 - 46.0 %   MCV 98.0 80.0 - 100.0 fL   MCH 32.7 26.0 - 34.0 pg   MCHC 33.3 30.0 - 36.0 g/dL   RDW 13.0 11.5 - 15.5 %   Platelets 145 (L) 150 - 400 K/uL   nRBC 0.0 0.0 - 0.2 %   Neutrophils Relative % 75 %   Neutro Abs 5.5 1.7 - 7.7 K/uL   Lymphocytes Relative 13 %   Lymphs Abs 1.0 0.7 -  4.0 K/uL   Monocytes Relative 10 %   Monocytes Absolute 0.7 0.1 - 1.0 K/uL   Eosinophils Relative 1 %   Eosinophils Absolute 0.0 0.0 - 0.5 K/uL   Basophils Relative 1 %   Basophils Absolute 0.0 0.0 - 0.1 K/uL   Immature Granulocytes 0 %   Abs Immature Granulocytes 0.02 0.00 - 0.07 K/uL  Urinalysis, Routine w reflex microscopic     Status: Abnormal   Collection Time: 12/13/17  4:24 AM  Result Value Ref Range   Color, Urine STRAW (A) YELLOW   APPearance CLEAR CLEAR   Specific Gravity, Urine 1.005 1.005 - 1.030   pH 5.0 5.0 - 8.0   Glucose, UA NEGATIVE NEGATIVE mg/dL   Hgb urine dipstick NEGATIVE NEGATIVE   Bilirubin Urine NEGATIVE NEGATIVE   Ketones, ur 5 (A) NEGATIVE mg/dL   Protein, ur NEGATIVE NEGATIVE mg/dL   Nitrite NEGATIVE NEGATIVE   Leukocytes, UA NEGATIVE NEGATIVE  Basic metabolic panel     Status: Abnormal   Collection Time: 12/13/17  4:24 AM  Result Value Ref Range   Sodium 135 135 - 145 mmol/L   Potassium 3.9 3.5 - 5.1 mmol/L   Chloride 97 (L) 98 - 111 mmol/L   CO2 29 22 - 32 mmol/L   Glucose, Bld 127 (H) 70 - 99 mg/dL   BUN 12 6 - 20 mg/dL   Creatinine, Ser 0.78 0.44 - 1.00 mg/dL   Calcium 9.2 8.9 - 10.3 mg/dL   GFR calc non Af Amer >60 >60 mL/min   GFR calc Af Amer >60 >60 mL/min   Anion gap 9 5 - 15  C difficile quick scan w PCR reflex     Status: Abnormal   Collection Time: 12/13/17  6:24 AM  Result Value Ref Range   C Diff antigen POSITIVE (A) NEGATIVE   C Diff toxin POSITIVE (A) NEGATIVE   C Diff  interpretation Toxin producing C. difficile detected.    Imaging Studies: Ct Abdomen Pelvis W Contrast  Result Date: 12/13/2017 CLINICAL DATA:  Status post surgical removal of rectal mass. Abdominal pain, fever. EXAM: CT ABDOMEN AND PELVIS WITH CONTRAST TECHNIQUE: Multidetector CT imaging of the abdomen and pelvis was performed using the standard protocol following bolus administration of intravenous contrast. CONTRAST:  16mL ISOVUE-300 IOPAMIDOL (ISOVUE-300) INJECTION 61% COMPARISON:  CT scan of September 18, 2017. FINDINGS: Lower chest: No acute abnormality. Hepatobiliary: No focal liver abnormality is seen. No gallstones, gallbladder wall thickening, or biliary dilatation. Pancreas: Unremarkable. No pancreatic ductal dilatation or surrounding inflammatory changes. Spleen: Normal in size without focal abnormality. Adrenals/Urinary Tract: Left adrenal gland appears normal. Stable 1.8 cm right adrenal nodule is noted with calculated washout ratio of nearly 50% consistent with adenoma. The kidneys appear normal. No hydronephrosis or renal obstruction is noted. No renal or ureteral calculi are noted. Urinary bladder is unremarkable. Stomach/Bowel: Stomach appears normal. Status post appendectomy. Diffuse wall and fold thickening is seen throughout the colon consistent with infectious or inflammatory colitis. Surgical anastomotic site is noted at rectosigmoid junction. Vascular/Lymphatic: Aortic atherosclerosis. No enlarged abdominal or pelvic lymph nodes. Reproductive: Status post hysterectomy. No adnexal masses. Other: Small amount of free fluid is noted in the pelvis. No hernia is noted. Musculoskeletal: No acute or significant osseous findings. IMPRESSION: Diffuse wall and fold thickening is seen throughout the colon consistent with infectious or inflammatory colitis. Postsurgical changes are noted at rectal sigmoid junction. Stable right adrenal adenoma. Aortic Atherosclerosis (ICD10-I70.0). Electronically  Signed   By: Jeneen Rinks  Murlean Caller, M.D.   On: 12/13/2017 09:47    ED COURSE and MDM  Nursing notes and initial vitals signs, including pulse oximetry, reviewed.  Vitals:   12/13/17 0750 12/13/17 0800 12/13/17 0930 12/13/17 1000  BP: 133/63  (!) 115/57 123/66  Pulse: 97 94 95 98  Resp: 14 15 15 16   Temp:      TempSrc:      SpO2: 97% 94% 97% 94%  Weight:      Height:        PROCEDURES    ED DIAGNOSES     ICD-10-CM   1. C. difficile colitis A04.72        Shaira Sova, MD 12/13/17 2237

## 2017-12-13 NOTE — ED Notes (Signed)
Patient aware we need urine and stool sample, will call out when ready to void/ defecate

## 2017-12-24 MED FILL — SYNTHROID 88 MCG TABLET: 88 | 30 days supply | Qty: 30 | Fill #2

## 2017-12-24 MED FILL — ZOLPIDEM TARTRATE 10 MG TAB: 10 | 30 days supply | Qty: 30 | Fill #0

## 2017-12-25 MED FILL — SERTRALINE HCL 50 MG TABLET: 50 | 90 days supply | Qty: 270 | Fill #1

## 2018-01-04 MED FILL — PRIMIDONE 50 MG TABLET: 50 | 30 days supply | Qty: 150 | Fill #1

## 2018-01-10 MED FILL — PROPRANOLOL ER 60 MG CAP: 60 | 30 days supply | Qty: 30 | Fill #1

## 2018-01-21 MED FILL — EZETIMIBE 10 MG TABS: 10 | 90 days supply | Qty: 90 | Fill #0

## 2018-01-21 MED FILL — ZOLPIDEM TARTRATE 10 MG TAB: 10 | 30 days supply | Qty: 30 | Fill #1

## 2018-01-21 MED FILL — VANCOMYCIN HCL 125 MG CAP: 125 | 10 days supply | Qty: 40 | Fill #0

## 2018-02-06 DIAGNOSIS — M859 Disorder of bone density and structure, unspecified: Secondary | ICD-10-CM | POA: Diagnosis not present

## 2018-02-06 DIAGNOSIS — E7849 Other hyperlipidemia: Secondary | ICD-10-CM | POA: Diagnosis not present

## 2018-02-06 DIAGNOSIS — E038 Other specified hypothyroidism: Secondary | ICD-10-CM | POA: Diagnosis not present

## 2018-02-07 MED FILL — PRIMIDONE 50 MG TABLET: 50 | 30 days supply | Qty: 150 | Fill #2

## 2018-02-07 MED FILL — PROPRANOLOL ER 60 MG CAP: 60 | 30 days supply | Qty: 30 | Fill #2

## 2018-02-07 MED FILL — LEVETIRACETAM ER 500 MG TAB: 500 | 60 days supply | Qty: 300 | Fill #0

## 2018-02-07 MED FILL — SUBVENITE 100 MG TABS: 100 | 90 days supply | Qty: 180 | Fill #0

## 2018-02-08 DIAGNOSIS — R7301 Impaired fasting glucose: Secondary | ICD-10-CM | POA: Diagnosis not present

## 2018-02-08 DIAGNOSIS — I10 Essential (primary) hypertension: Secondary | ICD-10-CM | POA: Diagnosis not present

## 2018-02-08 DIAGNOSIS — R569 Unspecified convulsions: Secondary | ICD-10-CM | POA: Diagnosis not present

## 2018-02-08 DIAGNOSIS — F418 Other specified anxiety disorders: Secondary | ICD-10-CM | POA: Diagnosis not present

## 2018-02-08 DIAGNOSIS — Z1331 Encounter for screening for depression: Secondary | ICD-10-CM | POA: Diagnosis not present

## 2018-02-08 DIAGNOSIS — Z1339 Encounter for screening examination for other mental health and behavioral disorders: Secondary | ICD-10-CM | POA: Diagnosis not present

## 2018-02-08 DIAGNOSIS — E7849 Other hyperlipidemia: Secondary | ICD-10-CM | POA: Diagnosis not present

## 2018-02-08 DIAGNOSIS — G250 Essential tremor: Secondary | ICD-10-CM | POA: Diagnosis not present

## 2018-02-08 DIAGNOSIS — M859 Disorder of bone density and structure, unspecified: Secondary | ICD-10-CM | POA: Diagnosis not present

## 2018-02-08 DIAGNOSIS — E038 Other specified hypothyroidism: Secondary | ICD-10-CM | POA: Diagnosis not present

## 2018-02-08 DIAGNOSIS — I639 Cerebral infarction, unspecified: Secondary | ICD-10-CM | POA: Diagnosis not present

## 2018-02-08 MED FILL — VIT D2 1.25 MG (50,000 UNIT: 1.25 MG | 84 days supply | Qty: 24 | Fill #0

## 2018-02-08 MED FILL — ATORVASTATIN 40 MG TABLET: 40 | 90 days supply | Qty: 90 | Fill #0

## 2018-02-14 MED FILL — SYNTHROID 88 MCG TABLET: 88 | 30 days supply | Qty: 30 | Fill #3

## 2018-02-14 MED FILL — DIVALPROEX SOD ER 500 MG TA: 500 | 90 days supply | Qty: 90 | Fill #0

## 2018-02-18 ENCOUNTER — Other Ambulatory Visit: Payer: Self-pay | Admitting: Neurology

## 2018-02-18 ENCOUNTER — Telehealth: Payer: Self-pay

## 2018-02-18 MED FILL — ZOLPIDEM TARTRATE 10 MG TAB: 10 | 30 days supply | Qty: 30 | Fill #0

## 2018-02-18 NOTE — Telephone Encounter (Signed)
Spoke with pt letting her know that Dr. Delice Lesch has filled out the Neurological section of DMV forms.  It appears that pt's optometrist still needs to fill out eye exam portion.  Advised that copy is at front desk to pick up at pt's convenience.  Pt asked about her Ambien Rx.  I let her know that it was sent to [facility] outpatient pharmacy this AM

## 2018-02-23 HISTORY — PX: OTHER SURGICAL HISTORY: SHX169

## 2018-03-12 MED FILL — PRIMIDONE 50 MG TABLET: 50 | 30 days supply | Qty: 150 | Fill #3

## 2018-03-13 DIAGNOSIS — Z9689 Presence of other specified functional implants: Secondary | ICD-10-CM | POA: Diagnosis not present

## 2018-03-13 DIAGNOSIS — G250 Essential tremor: Secondary | ICD-10-CM | POA: Diagnosis not present

## 2018-03-15 DIAGNOSIS — Z0100 Encounter for examination of eyes and vision without abnormal findings: Secondary | ICD-10-CM | POA: Diagnosis not present

## 2018-03-20 ENCOUNTER — Ambulatory Visit (INDEPENDENT_AMBULATORY_CARE_PROVIDER_SITE_OTHER): Payer: 59 | Admitting: Psychology

## 2018-03-20 DIAGNOSIS — F331 Major depressive disorder, recurrent, moderate: Secondary | ICD-10-CM | POA: Diagnosis not present

## 2018-03-20 MED FILL — PROPRANOLOL ER 60 MG CAP: 60 | 30 days supply | Qty: 30 | Fill #3

## 2018-03-20 MED FILL — SYNTHROID 88 MCG TABLET: 88 | 30 days supply | Qty: 30 | Fill #4

## 2018-03-20 MED FILL — ZOLPIDEM TARTRATE 10 MG TAB: 10 | 30 days supply | Qty: 30 | Fill #1

## 2018-03-26 ENCOUNTER — Encounter: Payer: Self-pay | Admitting: Neurology

## 2018-03-26 ENCOUNTER — Other Ambulatory Visit: Payer: Self-pay

## 2018-03-26 ENCOUNTER — Ambulatory Visit: Payer: 59 | Admitting: Neurology

## 2018-03-26 VITALS — BP 126/72 | HR 64 | Ht 62.0 in | Wt 157.0 lb

## 2018-03-26 DIAGNOSIS — G40309 Generalized idiopathic epilepsy and epileptic syndromes, not intractable, without status epilepticus: Secondary | ICD-10-CM

## 2018-03-26 DIAGNOSIS — F329 Major depressive disorder, single episode, unspecified: Secondary | ICD-10-CM | POA: Diagnosis not present

## 2018-03-26 DIAGNOSIS — G4700 Insomnia, unspecified: Secondary | ICD-10-CM | POA: Diagnosis not present

## 2018-03-26 DIAGNOSIS — F32A Depression, unspecified: Secondary | ICD-10-CM

## 2018-03-26 MED ORDER — SERTRALINE HCL 50 MG PO TABS: ORAL_TABLET | ORAL | 3 refills | Status: DC

## 2018-03-26 MED ORDER — CLONAZEPAM 0.5 MG PO TABS: 0.5000 mg | ORAL_TABLET | Freq: Two times a day (BID) | ORAL | 3 refills | Status: DC | PRN

## 2018-03-26 MED ORDER — LEVETIRACETAM ER 500 MG PO TB24: 2500.0000 mg | ORAL_TABLET | Freq: Every day | ORAL | 3 refills | Status: DC

## 2018-03-26 MED ORDER — DIVALPROEX SODIUM ER 500 MG PO TB24: 500.0000 mg | ORAL_TABLET | Freq: Every day | ORAL | 3 refills | Status: DC

## 2018-03-26 MED ORDER — LAMOTRIGINE 100 MG PO TABS: 100.0000 mg | ORAL_TABLET | Freq: Two times a day (BID) | ORAL | 3 refills | Status: DC

## 2018-03-26 MED FILL — clonazePAM 0.5 MG TABS: 0.5 | 5 days supply | Qty: 10 | Fill #0

## 2018-03-26 MED FILL — SERTRALINE HCL 50 MG TABLET: 50 | 90 days supply | Qty: 270 | Fill #0

## 2018-03-26 NOTE — Patient Instructions (Signed)
1. Reduce Keppra XR 500mg : take 4 tablets daily 2. Continue all your medications 3. Follow-up in 6 months, call for any changes  Seizure Precautions: 1. If medication has been prescribed for you to prevent seizures, take it exactly as directed.  Do not stop taking the medicine without talking to your doctor first, even if you have not had a seizure in a long time.   2. Avoid activities in which a seizure would cause danger to yourself or to others.  Don't operate dangerous machinery, swim alone, or climb in high or dangerous places, such as on ladders, roofs, or girders.  Do not drive unless your doctor says you may.  3. If you have any warning that you may have a seizure, lay down in a safe place where you can't hurt yourself.    4.  No driving for 6 months from last seizure, as per [facility].   Please refer to the following link on the [place] website for more information: http://[email or web address]/[email or web address]/drivingu.cfm   5.  Maintain good sleep hygiene. Avoid alcohol.  6.  Contact your doctor if you have any problems that may be related to the medicine you are taking.  7.  Call 911 and bring the patient back to the ED if:        A.  The seizure lasts longer than 5 minutes.       B.  The patient doesn't awaken shortly after the seizure  C.  The patient has new problems such as difficulty seeing, speaking or moving  D.  The patient was injured during the seizure  E.  The patient has a temperature over 102 F (39C)  F.  The patient vomited and now is having trouble breathing

## 2018-03-26 NOTE — Progress Notes (Signed)
NEUROLOGY FOLLOW UP OFFICE NOTE  Alyssa Mccarthy [id number] 1965-07-17  HISTORY OF PRESENT ILLNESS: I had the pleasure of seeing Alyssa Mccarthy in follow-up in the neurology clinic on 03/26/2018.  The patient was last seen 8 months ago for seizures. She is alone in the office today. She continues to do well with no seizures since 2015. She has had brief episodes where she has a sudden intense feeling of fatigue, there was concern these may be seizure-related or auras and she was given prn clonazepam which she rarely takes. Last clonazepam use was a couple of weeks ago. Mood is much better, she has less stress and sees her psychologist regularly. She is on Zoloft 150mg  daily. She has had only two episodes where she felt depressed and wanted to stay in bed. She notices word-finding difficulties, a word in her head just won't come out or she would say a different word that is related. When writing, she may leave a letter or two out from the end of a word. She denies getting lost driving, no missed medications. She is on Keppra XR 2500mg  daily, Lamictal 100mg  BID, Depakote ER 500mg  qhs, as well as Primidone 100mg  in AM, 150mg  in PM for essential tremor. Tremors are well controlled. She denies any headaches, dizziness, diplopia, focal numbness/tingling/weakness, no falls. Sleep is good, she takes prn Ambien.  HPI 01/29/2017: This is a pleasant 53 yo RH woman with a history of idiopathic generalized epilepsy, essential tremor s/p VIM DBS, presenting to establish care. She had been seeing epileptologist Dr. [name] at [facility], records were reviewed. She sees Movement Disorder specialist Dr. [name] for the essential tremor. Seizures started around age 53. She reports seizures were well-controlled for 25 years, then after she had her daughter, she was having more seizures for 3-4 years, then overall has been well-controlled the past 10 years. She has no clear aura, describes body jerking that progresses to a generalized  convulsion. Flashing lights and sleep deprivation are seizure triggers. She has not had any seizures since around 2015 when Depakote was re-added to her regimen. She is currently on Lamictal 100mg  BID, Keppra XR 2500mg  daily, and Depakote ER 500mg  daily with no side effects. She had been on Depakote for many years in the past but had weight gain, it was stopped, then restarted around 4 years ago. She is also on Primidone for the tremor. She denies any olfactory/gustatory hallucinations, deja vu, rising epigastric sensation, focal numbness/tingling/weakness, myoclonic jerks. She rarely has a funny feeling that "something is not quite right" and would take a prn clonazepam as a precaution, she has not needed this in about a year.   She denies any headaches, dizziness, diplopia, dysarthria/dysphagia, neck/back pain, bowel/bladder dysfunction. Sleep is good. She had been taking Zoloft increased dose 4 years ago due to irritability, improved with higher dose. She was diagnosed with a stroke in January 2018 when she had a bad headache and anisocoria. MRI brain showed subtle T2 hyperintensity in the pons, R>L. She saw Dr. 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 She has chronic mild right-sided sensory changes since then. She has also had symptoms every once in a while where there is something she can't see (not clearly on peripheral vision), she blinks and vision is back to normal. She has chronic numbness on the left elbow region. She has noticed she has no interest in eating, but makes herself eat. 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  Diagnostic Data: MRI brain in March 2011 reported as normal. EEG in March 2010 abnormal due to generalized polyspike and wave, consistent with a primary generalized epilepsy Prior AEDs: Phenobarbital, Zonisamide, Dilantin. On a different generic Levetiracetam, she had dry hands and peeling in fingertips, which resolved after switching to a different  generic  Epilepsy Risk Factors:  There is a strong family history of seizures in her paternal grandfather, father, and daughter. Otherwise she had a normal birth and early development.  There is no history of febrile convulsions, CNS infections such as meningitis/encephalitis, significant traumatic brain injury, neurosurgical procedures.  PAST MEDICAL HISTORY: Past Medical History:  Diagnosis Date  . Complication of anesthesia   . Essential tremor   . Hyperlipidemia   . Hypothyroidism   . PONV (postoperative nausea and vomiting)   . Seizure disorder ([place])   . Stroke [facility])     MEDICATIONS: Current Outpatient Medications on File Prior to Visit  Medication Sig Dispense Refill  . aspirin EC 325 MG EC tablet Take 1 tablet (325 mg total) by mouth daily. 30 tablet 1  . atorvastatin (LIPITOR) 40 MG tablet Take 1 tablet (40 mg total) by mouth every evening. 30 tablet 1  . clonazePAM (KLONOPIN) 0.5 MG tablet Take 0.5 mg by mouth 2 (two) times daily as needed (for seizures).     . divalproex (DEPAKOTE ER) 500 MG 24 hr tablet Take 1 tablet (500 mg total) by mouth at bedtime. (Patient taking differently: Take 500 mg by mouth every evening. ) 90 tablet 3  . ergocalciferol (VITAMIN D2) 1.25 MG (50000 UT) capsule ergocalciferol (vitamin D2) 1,250 mcg (50,000 unit) capsule    . ezetimibe (ZETIA) 10 MG tablet Take 10 mg by mouth daily.     Marland Kitchen lamoTRIgine (LAMICTAL) 100 MG tablet Take 1 tablet (100 mg total) by mouth 2 (two) times daily. 180 tablet 3  . levETIRAcetam (KEPPRA XR) 500 MG 24 hr tablet Take 5 tablets (2,500 mg total) by mouth daily. 450 tablet 3  . levothyroxine (SYNTHROID, LEVOTHROID) 88 MCG tablet Take 88 mcg by mouth daily before breakfast.    . Melatonin 5 MG TABS Take 15 mg by mouth as needed (sleep).     . Omega-3 Fatty Acids (OMEGA 3 PO) Take 1 capsule by mouth daily.    . primidone (MYSOLINE) 50 MG tablet Take 100-150 mg by mouth See admin instructions. Take 100 mg by mouth in the  morning and take 150 mg by mouth in the evening    . propranolol ER (INDERAL LA) 60 MG 24 hr capsule Take 60 mg by mouth daily.     . sertraline (ZOLOFT) 50 MG tablet Take 3 tablets daily (Patient taking differently: Take 50-100 mg by mouth See admin instructions. Take 100 mg by mouth in the morning and take 50 mg by mouth in the evening) 270 tablet 3  . traMADol (ULTRAM) 50 MG tablet Take 1-2 tablets (50-100 mg total) by mouth every 6 (six) hours as needed for moderate pain or severe pain. 20 tablet 0  . vancomycin (VANCOCIN) 125 MG capsule     . Vitamin D, Ergocalciferol, (DRISDOL) 1.25 MG (50000 UT) CAPS capsule     . zolpidem (AMBIEN) 10 MG tablet TAKE 1 TABLET (10 MG TOTAL) BY MOUTH AT BEDTIME. 30 tablet 5   No current facility-administered medications on file prior to visit.     ALLERGIES: Allergies  Allergen Reactions  . Gluten Meal Diarrhea and Other (See Comments)    Severe  diarrhea  . Other Other (See Comments)    Bleach-respiratory distress  . Plavix [Clopidogrel Bisulfate] Itching, Rash and Other (See Comments)    Hands and feet   . Protonix [Pantoprazole] Itching and Other (See Comments)    Whelps  . Penicillins Itching, Rash and Other (See Comments)    Childhood allergy- specifics unknown; tolerated amoxicillin and ancef since then     FAMILY HISTORY: Family History  Problem Relation Age of Onset  . Heart attack Father 71  . Stroke Neg Hx   . Breast cancer Neg Hx     SOCIAL HISTORY: Social History   Socioeconomic History  . Marital status: Married    Spouse name: Not on file  . Number of children: Not on file  . Years of education: Not on file  . Highest education level: Not on file  Occupational History  . Not on file  Social Needs  . Financial resource strain: Not on file  . Food insecurity:    Worry: Not on file    Inability: Not on file  . Transportation needs:    Medical: Not on file    Non-medical: Not on file  Tobacco Use  . Smoking status:  Never Smoker  . Smokeless tobacco: Never Used  Substance and Sexual Activity  . Alcohol use: Yes    Comment: socially  . Drug use: No  . Sexual activity: Not on file  Lifestyle  . Physical activity:    Days per week: Not on file    Minutes per session: Not on file  . Stress: Not on file  Relationships  . Social connections:    Talks on phone: Not on file    Gets together: Not on file    Attends religious service: Not on file    Active member of club or organization: Not on file    Attends meetings of clubs or organizations: Not on file    Relationship status: Not on file  . Intimate partner violence:    Fear of current or ex partner: Not on file    Emotionally abused: Not on file    Physically abused: Not on file    Forced sexual activity: Not on file  Other Topics Concern  . Not on file  Social History Narrative   Lives in 1 story home with her husband and 2 sons   Has 2 adult sons   Has bach. Degree   Works as Designer, jewellery    REVIEW OF SYSTEMS: Constitutional: No fevers, chills, or sweats, no generalized fatigue, change in appetite Eyes: No visual changes, double vision, eye pain Ear, nose and throat: No hearing loss, ear pain, nasal congestion, sore throat Cardiovascular: No chest pain, palpitations Respiratory:  No shortness of breath at rest or with exertion, wheezes GastrointestinaI: No nausea, vomiting, diarrhea, abdominal pain, fecal incontinence Genitourinary:  No dysuria, urinary retention or frequency Musculoskeletal:  No neck pain, back pain Integumentary: No rash, pruritus, skin lesions Neurological: as above Psychiatric: No depression, +insomnia, no anxiety Endocrine: No palpitations, fatigue, diaphoresis, mood swings, change in appetite, change in weight, increased thirst Hematologic/Lymphatic:  No anemia, purpura, petechiae. Allergic/Immunologic: no itchy/runny eyes, nasal congestion, recent allergic reactions, rashes  PHYSICAL EXAM: Vitals:    03/26/18 1510  BP: 126/72  Pulse: 64  SpO2: 94%   General: No acute distress Head:  Normocephalic/atraumatic Neck: supple, no paraspinal tenderness, full range of motion Heart:  Regular rate and rhythm Lungs:  Clear to auscultation bilaterally Back: No  paraspinal tenderness Skin/Extremities: No rash, no edema Neurological Exam: alert and oriented to person, place, and time. No aphasia or dysarthria. Fund of knowledge is appropriate.  Recent and remote memory are intact.  Attention and concentration are normal.    Able to name objects and repeat phrases. Cranial nerves: Pupils asymmetric (chronic), reactive to light.  Extraocular movements intact with no nystagmus. Visual fields full. Facial sensation intact. No facial asymmetry. Tongue, uvula, palate midline.  Motor: Bulk and tone normal, muscle strength 5/5 throughout with no pronator drift.  Sensation to light touch intact.  No extinction to double simultaneous stimulation.  Deep tendon reflexes 2+ throughout, toes downgoing.  Finger to nose testing intact.  Gait narrow-based and steady, able to tandem walk adequately.  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 She has been seizure-free since 2015 with re-addition of low dose Depakote ER 500mg  daily. She is also on Lamictal 100mg  BID and Keppra XR 2500mg  daily. She is interested in streamlining medications, we agreed to reduce Keppra XR to 2000mg  daily. We discussed risks for breakthrough seizure with any medication adjustment, continue to monitor. She is also on Primidone for tremor. Continue Zoloft and Ambien. She is aware of  driving laws to stop driving after a seizure until 6 months seizure-free. She will follow-up in 6 months and knows to call for any changes.   Thank you for allowing  me to participate in her care.  Please do not hesitate to call for any questions or concerns.  The duration of this appointment visit was 26 minutes of face-to-face time with the patient.  Greater than 50% of this time was spent in counseling, explanation of diagnosis, planning of further management, and coordination of care.   Ellouise Newer, M.D.   CC: Dr. Forde Dandy

## 2018-04-12 MED FILL — LEVETIRACETAM ER 500 MG TAB: 500 | 90 days supply | Qty: 450 | Fill #0

## 2018-04-12 MED FILL — PRIMIDONE 50 MG TABLET: 50 | 30 days supply | Qty: 150 | Fill #4

## 2018-04-18 MED FILL — PROPRANOLOL ER 60 MG CAP: 60 | 30 days supply | Qty: 30 | Fill #4

## 2018-04-18 MED FILL — ZOLPIDEM TARTRATE 10 MG TAB: 10 | 30 days supply | Qty: 30 | Fill #2

## 2018-04-18 MED FILL — EZETIMIBE 10 MG TABS: 10 | 90 days supply | Qty: 90 | Fill #1

## 2018-04-30 MED FILL — SYNTHROID 88 MCG TABLET: 88 | 90 days supply | Qty: 90 | Fill #0

## 2018-04-30 MED FILL — VIT D2 1.25 MG (50,000 UNIT: 1.25 MG | 84 days supply | Qty: 24 | Fill #1

## 2018-04-30 MED FILL — SUBVENITE 100 MG TABS: 100 | 90 days supply | Qty: 180 | Fill #0

## 2018-04-30 MED FILL — ATORVASTATIN 40 MG TABLET: 40 | 90 days supply | Qty: 90 | Fill #1

## 2018-05-06 MED FILL — PRIMIDONE 50 MG TABLET: 50 | 30 days supply | Qty: 150 | Fill #5

## 2018-05-12 MED FILL — PROPRANOLOL ER 60 MG CAP: 60 | 30 days supply | Qty: 30 | Fill #5

## 2018-05-14 MED FILL — ZOLPIDEM TARTRATE 10 MG TAB: 10 | 30 days supply | Qty: 30 | Fill #3

## 2018-05-14 MED FILL — clonazePAM 0.5 MG TABS: 0.5 | 5 days supply | Qty: 10 | Fill #1

## 2018-05-14 MED FILL — DIVALPROEX SOD ER 500 MG TA: 500 | 90 days supply | Qty: 90 | Fill #0

## 2018-05-23 ENCOUNTER — Ambulatory Visit: Payer: 59 | Admitting: Psychology

## 2018-06-04 DIAGNOSIS — K582 Mixed irritable bowel syndrome: Secondary | ICD-10-CM | POA: Diagnosis not present

## 2018-06-04 DIAGNOSIS — R140 Abdominal distension (gaseous): Secondary | ICD-10-CM | POA: Diagnosis not present

## 2018-06-04 DIAGNOSIS — Z86010 Personal history of colonic polyps: Secondary | ICD-10-CM | POA: Diagnosis not present

## 2018-06-04 DIAGNOSIS — Z8619 Personal history of other infectious and parasitic diseases: Secondary | ICD-10-CM | POA: Diagnosis not present

## 2018-06-05 MED FILL — PRIMIDONE 50 MG TABLET: 50 | 30 days supply | Qty: 150 | Fill #0

## 2018-06-06 ENCOUNTER — Ambulatory Visit: Payer: 59 | Admitting: Psychology

## 2018-06-06 DIAGNOSIS — Z9689 Presence of other specified functional implants: Secondary | ICD-10-CM | POA: Diagnosis not present

## 2018-06-06 DIAGNOSIS — Z4542 Encounter for adjustment and management of neuropacemaker (brain) (peripheral nerve) (spinal cord): Secondary | ICD-10-CM | POA: Diagnosis not present

## 2018-06-06 DIAGNOSIS — G250 Essential tremor: Secondary | ICD-10-CM | POA: Diagnosis not present

## 2018-06-11 MED FILL — ZOLPIDEM TARTRATE 10 MG TAB: 10 | 30 days supply | Qty: 30 | Fill #4

## 2018-06-11 MED FILL — PROPRANOLOL HCL ER 60 MG CP: 60 | 30 days supply | Qty: 30 | Fill #6

## 2018-06-12 ENCOUNTER — Ambulatory Visit (INDEPENDENT_AMBULATORY_CARE_PROVIDER_SITE_OTHER): Payer: 59 | Admitting: Psychology

## 2018-06-12 DIAGNOSIS — F324 Major depressive disorder, single episode, in partial remission: Secondary | ICD-10-CM

## 2018-06-20 ENCOUNTER — Ambulatory Visit (INDEPENDENT_AMBULATORY_CARE_PROVIDER_SITE_OTHER): Payer: 59 | Admitting: Psychology

## 2018-06-20 DIAGNOSIS — F324 Major depressive disorder, single episode, in partial remission: Secondary | ICD-10-CM

## 2018-06-29 MED FILL — PRIMIDONE 50 MG TABLET: 50 | 30 days supply | Qty: 150 | Fill #1

## 2018-07-01 MED FILL — SERTRALINE HCL 50 MG TABS: 50 | 90 days supply | Qty: 270 | Fill #1

## 2018-07-04 ENCOUNTER — Ambulatory Visit (INDEPENDENT_AMBULATORY_CARE_PROVIDER_SITE_OTHER): Payer: 59 | Admitting: Psychology

## 2018-07-04 DIAGNOSIS — F331 Major depressive disorder, recurrent, moderate: Secondary | ICD-10-CM | POA: Diagnosis not present

## 2018-07-11 MED FILL — PROPRANOLOL HCL ER 60 MG CP: 60 | 30 days supply | Qty: 30 | Fill #7

## 2018-07-15 MED FILL — ZOLPIDEM TARTRATE 10 MG TAB: 10 | 30 days supply | Qty: 30 | Fill #5

## 2018-07-16 MED FILL — EZETIMIBE 10 MG TABS: 10 | 90 days supply | Qty: 90 | Fill #0

## 2018-07-20 MED FILL — VIT D2 1.25 MG (50,000 UNIT: 1.25 MG | 84 days supply | Qty: 24 | Fill #2

## 2018-07-23 MED FILL — SYNTHROID 88 MCG TABLET: 88 | 90 days supply | Qty: 90 | Fill #1

## 2018-07-23 MED FILL — SUBVENITE 100 MG TABS: 100 | 90 days supply | Qty: 180 | Fill #1

## 2018-07-26 MED FILL — PRIMIDONE 50 MG TABLET: 50 | 30 days supply | Qty: 150 | Fill #2

## 2018-07-29 MED FILL — ATORVASTATIN 40 MG TABLET: 40 | 90 days supply | Qty: 90 | Fill #0

## 2018-08-06 MED FILL — DIVALPROEX SOD ER 500 MG TA: 500 | 90 days supply | Qty: 90 | Fill #1

## 2018-08-09 ENCOUNTER — Encounter: Payer: Self-pay | Admitting: Nurse Practitioner

## 2018-08-09 ENCOUNTER — Ambulatory Visit (INDEPENDENT_AMBULATORY_CARE_PROVIDER_SITE_OTHER): Payer: Self-pay | Admitting: Nurse Practitioner

## 2018-08-09 ENCOUNTER — Other Ambulatory Visit: Payer: Self-pay

## 2018-08-09 VITALS — BP 152/82 | HR 67 | Temp 98.4°F | Resp 20 | Wt 163.2 lb

## 2018-08-09 DIAGNOSIS — T162XXA Foreign body in left ear, initial encounter: Secondary | ICD-10-CM

## 2018-08-09 DIAGNOSIS — J309 Allergic rhinitis, unspecified: Secondary | ICD-10-CM

## 2018-08-09 MED ORDER — FLUTICASONE PROPIONATE 50 MCG/ACT NA SUSP: 2.0000 | Freq: Every day | NASAL | 0 refills | Status: DC

## 2018-08-09 NOTE — Progress Notes (Signed)
MRN: [id number] DOB: 1965/10/24  Subjective:   Alyssa Mccarthy is a 53 y.o. female presenting for chief complaint of sinus pressure (x3days (nyquil)) .  Reports a 3 day history of sinus pressure and decreased hearing in the left ear. Has tried Nyquil for relief. Denies subjective fever, sinus headache, sinus congestion , sinus pain, rhinorrhea, itchy watery eyes, red eyes, ear drainage, sore throat, difficulty swallowing, pain with swallowing, inability to swallow, voice change, dry cough, productive cough, wheezing, shortness of breath, chest tightness, chest pain and myalgia,loss of taste or smell,  night sweats, chills, fatigue, malaise, decreased appetite, weight loss, nausea, vomiting, abdominal pain and diarrhea. Has not had sick contact with COVID-19. Admits to a history of seasonal allergies, denies history of asthma. Denies smoking. Denies recent travel. Denies any other aggravating or relieving factors, no other questions or concerns.  Review of Systems  Constitutional: Negative.   HENT: Positive for sinus pain. Negative for congestion, ear discharge, ear pain and sore throat.        Decreased hearing in left ear/left ear fullness  Eyes: Negative.   Respiratory: Negative.   Cardiovascular: Negative.   Gastrointestinal: Negative.   Skin: Negative.   Neurological: Negative.   Endo/Heme/Allergies: Positive for environmental allergies.    [name] has a current medication list which includes the following prescription(s): aspirin, atorvastatin, divalproex, ergocalciferol, ezetimibe, lamotrigine, levetiracetam, levothyroxine, melatonin, omega-3 fatty acids, primidone, propranolol er, sertraline, vancomycin, vitamin d (ergocalciferol), zolpidem, clonazepam, and tramadol. Also is allergic to gluten meal; other; plavix [clopidogrel bisulfate]; protonix [pantoprazole]; and penicillins.  [name]  has a past medical history of Complication of anesthesia, Essential tremor, Hyperlipidemia, Hypothyroidism,  PONV (postoperative nausea and vomiting), Seizure disorder ([place]), and Stroke ([place]). Also  has a past surgical history that includes Total knee arthroplasty; Abdominal hysterectomy; Appendectomy; Carpal tunnel release; Brain surgery (2015); and Partial proctectomy by tem (N/A, 12/04/2017).   Objective:   Vitals: BP (!) 152/82   Pulse 67   Temp 98.4 F (36.9 C)   Resp 20   Wt 163 lb 3.2 oz (74 kg)   SpO2 97%   BMI 29.85 kg/m   Physical Exam Vitals signs reviewed.  Constitutional:      General: She is not in acute distress. HENT:     Head: Normocephalic.     Right Ear: Tympanic membrane, ear canal and external ear normal.     Left Ear: External ear normal.     Ears:     Comments: Foreign object in left ear blocking left ear canal (patient informs it is the tip of her hearing aid)    Nose: Mucosal edema and rhinorrhea present. No congestion.     Right Nostril: No occlusion.     Left Nostril: No occlusion.     Right Turbinates: Enlarged and swollen.     Left Turbinates: Enlarged and swollen.     Right Sinus: Maxillary sinus tenderness and frontal sinus tenderness present.     Left Sinus: Maxillary sinus tenderness and frontal sinus tenderness present.     Mouth/Throat:     Lips: Pink.     Mouth: Mucous membranes are moist.     Pharynx: Uvula midline. No pharyngeal swelling, oropharyngeal exudate, posterior oropharyngeal erythema or uvula swelling.  Eyes:     Conjunctiva/sclera: Conjunctivae normal.     Pupils: Pupils are equal, round, and reactive to light.  Neck:     Musculoskeletal: Normal range of motion and neck supple.  Cardiovascular:     Rate and Rhythm:  Normal rate and regular rhythm.     Pulses: Normal pulses.     Heart sounds: Normal heart sounds.  Pulmonary:     Effort: Pulmonary effort is normal. No respiratory distress.     Breath sounds: Normal breath sounds. No stridor. No wheezing, rhonchi or rales.  Abdominal:     General: Bowel sounds are normal.      Palpations: Abdomen is soft.     Tenderness: There is no abdominal tenderness.  Lymphadenopathy:     Cervical: No cervical adenopathy.  Skin:    General: Skin is warm and dry.     Capillary Refill: Capillary refill takes less than 2 seconds.  Neurological:     General: No focal deficit present.     Mental Status: She is alert and oriented to person, place, and time.  Psychiatric:        Mood and Affect: Mood normal.        Thought Content: Thought content normal.   Performed gentle irrigation of left ear in an attempt to retrieve hearing aid device from left ear canal. Able to retrieve a portion of the object; however, there remains a portion that is tightly lodged in the ear canal.  Attempted removal; however, mild trauma caused.  Informed patient that I would like her to follow up with Audiologist for further retrieval.     Assessment and Plan :   Exam findings, diagnosis etiology and medication use and indications reviewed with patient. Follow- Up and discharge instructions provided. No emergent/urgent issues found on exam.  Patient did not display any other systemic symptoms to include fever, chills, nasal drainage or obstruction, double sickening, cough, or other concerns that may be related to a viral sinusitis.  Patient only presents symptoms of sinus tenderness and pressure.  I am going to prescribe the patient fluticasone steroid nasal spray to help with her sinus pressure.  I am also recommending that the patient take Zyrtec 10 mg daily at this time.  Other symptomatic recommendations include normal saline nasal spray, and a humidifier or vaporizer when at home.  Patient does not warrant antibiotic at this time based on the indications previously noted. With regard to the foreign body in the ear, was able to retrieve a portion of the object; however, there was an additional portion of the object that was tightly lodged in the ear canal.  Unable to retrieve the object with the supplies on  hand.  Patient was informed that she will need to follow-up with her audiologist first thing on Monday morning to determine next steps.  Patient was instructed to follow-up with her PCP if symptoms do not improve.  Patient education was provided. Patient verbalized understanding of information provided and agrees with plan of care (POC), all questions answered. The patient is advised to call or return to clinic if condition does not see an improvement in symptoms, or to seek the care of the closest emergency department if condition worsens with the above plan.    1. Allergic rhinitis, unspecified seasonality, unspecified trigger  - fluticasone (FLONASE) 50 MCG/ACT nasal spray; Place 2 sprays into both nostrils daily for 10 days.  Dispense: 16 g; Refill: 0 -Use medication as directed. -Take Zyrtec 10 mg daily each morning until symptoms improve. -Ibuprofen or Tylenol for pain, fever, or general discomfort. -May use normal saline nasal spray daily for throughout the day as needed to help with any possible nasal congestion and to keep sinuses irrigated. -Increase fluids and get plenty  of rest. -Use a humidifier or vaporizer when at home and during sleep. -May use normal saline nasal spray to help with nasal congestion throughout the day. -Follow-up if with your PCP if symptoms do not improve.  2. Foreign body of left ear, initial encounter  -Call your Audiologist Monday morning to alert them of the foreign body in the left ear to see if they can see you sooner than Wednesday for removal.

## 2018-08-09 NOTE — Patient Instructions (Addendum)
Allergic Rhinitis, Adult -Use medication as directed. -Take Zyrtec 10 mg daily each morning until symptoms improve. -Ibuprofen or Tylenol for pain, fever, or general discomfort. -May use normal saline nasal spray daily for throughout the day as needed to help with any possible nasal congestion and to keep sinuses irrigated. -Increase fluids and get plenty of rest. -Use a humidifier or vaporizer when at home and during sleep. -May use normal saline nasal spray to help with nasal congestion throughout the day. -Call your Audiologist Monday morning to alert them of the foreign body in the left ear to see if they can see you sooner than Wednesday for removal. -Follow-up if with your PCP if symptoms do not improve.  Allergic rhinitis is an allergic reaction that affects the mucous membrane inside the nose. It causes sneezing, a runny or stuffy nose, and the feeling of mucus going down the back of the throat (postnasal drip). Allergic rhinitis can be mild to severe. There are two types of allergic rhinitis:  Seasonal. This type is also called hay fever. It happens only during certain seasons.  Perennial. This type can happen at any time of the year. What are the causes? This condition happens when the body's defense system (immune system) responds to certain harmless substances called allergens as though they were germs.  Seasonal allergic rhinitis is triggered by pollen, which can come from grasses, trees, and weeds. Perennial allergic rhinitis may be caused by:  House dust mites.  Pet dander.  Mold spores. What are the signs or symptoms? Symptoms of this condition include:  Sneezing.  Runny or stuffy nose (nasal congestion).  Postnasal drip.  Itchy nose.  Tearing of the eyes.  Trouble sleeping.  Daytime sleepiness. How is this diagnosed? This condition may be diagnosed based on:  Your medical history.  A physical exam.  Tests to check for related conditions, such  as: ? Asthma. ? Pink eye. ? Ear infection. ? Upper respiratory infection.  Tests to find out which allergens trigger your symptoms. These may include skin or blood tests. How is this treated? There is no cure for this condition, but treatment can help control symptoms. Treatment may include:  Taking medicines that block allergy symptoms, such as antihistamines. Medicine may be given as a shot, nasal spray, or pill.  Avoiding the allergen.  Desensitization. This treatment involves getting ongoing shots until your body becomes less sensitive to the allergen. This treatment may be done if other treatments do not help.  If taking medicine and avoiding the allergen does not work, new, stronger medicines may be prescribed. Follow these instructions at home:  Find out what you are allergic to. Common allergens include smoke, dust, and pollen.  Avoid the things you are allergic to. These are some things you can do to help avoid allergens: ? Replace carpet with wood, tile, or vinyl flooring. Carpet can trap dander and dust. ? Do not smoke. Do not allow smoking in your home. ? Change your heating and air conditioning filter at least once a month. ? During allergy season:  Keep windows closed as much as possible.  Plan outdoor activities when pollen counts are lowest. This is usually during the evening hours.  When coming indoors, change clothing and shower before sitting on furniture or bedding.  Take over-the-counter and prescription medicines only as told by your health care provider.  Keep all follow-up visits as told by your health care provider. This is important. Contact a health care provider if:  You  have a fever.  You develop a persistent cough.  You make whistling sounds when you breathe (you wheeze).  Your symptoms interfere with your normal daily activities. Get help right away if:  You have shortness of breath. Summary  This condition can be managed by taking  medicines as directed and avoiding allergens.  Contact your health care provider if you develop a persistent cough or fever.  During allergy season, keep windows closed as much as possible. This information is not intended to replace advice given to you by your health care provider. Make sure you discuss any questions you have with your health care provider. Document Released: 10/04/2000 Document Revised: 12/22/2016 Document Reviewed: 02/17/2016 Elsevier Patient Education  2020 [place] Foreign Body An ear foreign body is an object that is stuck in the ear. The object is usually stuck in the ear canal. Do not try to remove an ear foreign body on your own. This could push the object farther into the ear. It is important to see a health care provider to have the object removed as soon as possible. What are the causes?  For all ages, insects are the most common cause of this condition.  It is common for young children to put objects into their ear canal. These may include food items, pebbles, beads, parts of toys, and any other small objects that fit into the ear.  In adults, objects such as cotton swabs may get stuck in the ear canal. What are the signs or symptoms? An object in the ear may cause:  Pain.  Buzzing or roaring sounds.  Hearing loss.  Fluid or blood coming out of the ear.  Nausea and vomiting.  A feeling that your ear is full. How is this diagnosed? This condition may be diagnosed based on:  Information you provide about what the object is and how it got stuck.  Your symptoms.  A physical exam.  Tests of your hearing or the pressure inside your ear. How is this treated? Treatment depends on what the object is, where it is in your ear, and whether any part of your ear is injured. If your health care provider can see the object in your ear, he or she may remove it by:  Using a tool, such as medical tweezers (forceps) or a suction tube  (catheter).  Flushing your ear with water (irrigation). This is done only if the object is not likely to get bigger when it is put in water. If your health care provider cannot see the object, or cannot remove it using one of these methods, you may need to see a specialist for removal. Treatment may also include:  Antibiotic medicine taken as pills or given as ear drops. These may be prescribed to prevent infection.  Cleaning and treating any injuries in your ear. Follow these instructions at home:  Take over-the-counter and prescription medicines only as told by your health care provider.  If you were prescribed an antibiotic medicine, such as pills or ear drops, take or use the antibiotic as told by your health care provider. Do not stop taking or using the antibiotic even if you start to feel better.  To prevent getting objects stuck in your ear: ? Do not put anything into your ear, including cotton swabs. Talk with your health care provider about how to clean your ears safely. ? Keep small objects out of the reach of young children. Teach children not to put anything into their ears.  Keep all follow-up visits as told by your health care provider. This is important. Contact a health care provider if you have:  A headache.  A fever.  Pain or swelling that gets worse.  Reduced hearing in your ear.  Ringing in your ear. Get help right away if you:  Notice blood or fluid coming from your ear. Summary  An ear foreign body is an object that is stuck in the ear.  Do not try to remove an ear foreign body on your own. This can push the object farther into the ear.  See a health care provider to have the object removed from your ear as soon as possible. This may keep you from developing an infection or hearing loss. This information is not intended to replace advice given to you by your health care provider. Make sure you discuss any questions you have with your health care  provider. Document Released: 01/07/2000 Document Revised: 01/08/2017 Document Reviewed: 01/08/2017 Elsevier Patient Education  2020 [employer].

## 2018-08-10 ENCOUNTER — Encounter (HOSPITAL_COMMUNITY): Payer: Self-pay | Admitting: Emergency Medicine

## 2018-08-10 ENCOUNTER — Ambulatory Visit (HOSPITAL_COMMUNITY)
Admission: EM | Admit: 2018-08-10 | Discharge: 2018-08-10 | Disposition: A | Discharge status: Home / Self Care | Payer: 59 | Attending: Family Medicine | Admitting: Family Medicine

## 2018-08-10 ENCOUNTER — Other Ambulatory Visit: Payer: Self-pay

## 2018-08-10 DIAGNOSIS — T162XXA Foreign body in left ear, initial encounter: Secondary | ICD-10-CM

## 2018-08-10 MED FILL — PROPRANOLOL HCL ER 60 MG CP: 60 | 30 days supply | Qty: 30 | Fill #8

## 2018-08-10 NOTE — ED Triage Notes (Signed)
Pt states she [facility] yesterday and they looked in her left ear and told her part of her hearing aid was stuck in her L ear, the provider stated there was something else stuck in her ear that they were unable to remove.

## 2018-08-10 NOTE — Discharge Instructions (Addendum)
Do not put anything in ear until you are able to follow-up with ENT for removal.

## 2018-08-10 NOTE — ED Provider Notes (Signed)
[place]    CSN: [id number] Arrival date & time: 08/10/18  1004     History   Chief Complaint Chief Complaint  Patient presents with  . Foreign Body in [place] is a 53 y.o. female resenting for acute concern of foreign body in left ear.  [facility].  States examiner noted foreign body in her ear, was able to remove part of it.  Patient states that procedure was painful, had some blood come out as well.  Patient followed by audiology for chronic hearing loss: Next appointment is a month from Wednesday.  Patient denies pain, tinnitus, acute worsening of hearing, dizziness.   Past Medical History:  Diagnosis Date  . Complication of anesthesia   . Essential tremor   . Hyperlipidemia   . Hypothyroidism   . PONV (postoperative nausea and vomiting)   . Seizure disorder ([place])   . Stroke [facility])     Patient Active Problem List   Diagnosis Date Noted  . Hypokalemia 12/05/2017  . Hypomagnesemia 12/05/2017  . Adenomatous rectal polyp s/p TEM partial proctectomy 12/04/2017 12/04/2017  . Cerebral thrombosis with cerebral infarction 02/18/2016  . Anisocoria 02/17/2016  . Epilepsy ([place]) 02/17/2016  . Essential tremor 02/17/2016  . HLD (hyperlipidemia) 02/17/2016  . Headache 02/17/2016  . Stroke-like symptoms 02/17/2016    Past Surgical History:  Procedure Laterality Date  . ABDOMINAL HYSTERECTOMY    . APPENDECTOMY    . BRAIN SURGERY  2015   deep brain stimulator implants bilaterally  . CARPAL TUNNEL RELEASE    . PARTIAL PROCTECTOMY BY TEM N/A 12/04/2017   Procedure: PARTIAL PROCTECTOMY REMOVAL OF RECTAL MASS BY TEM ERAS PATHWAY;  Surgeon: Michael Boston, MD;  Location: [facility];  Service: General;  Laterality: N/A;  . TOTAL KNEE ARTHROPLASTY      OB History   No obstetric history on file.      Home Medications    Prior to Admission medications   Medication Sig Start Date End Date  Taking? Authorizing Provider  aspirin EC 325 MG EC tablet Take 1 tablet (325 mg total) by mouth daily. 02/19/16   Caren Griffins, MD  atorvastatin (LIPITOR) 40 MG tablet Take 1 tablet (40 mg total) by mouth every evening. 02/18/16   Caren Griffins, MD  divalproex (DEPAKOTE ER) 500 MG 24 hr tablet Take 1 tablet (500 mg total) by mouth at bedtime. 03/26/18   Cameron Sprang, MD  ergocalciferol (VITAMIN D2) 1.25 MG (50000 UT) capsule ergocalciferol (vitamin D2) 1,250 mcg (50,000 unit) capsule    [provider]  ezetimibe (ZETIA) 10 MG tablet Take 10 mg by mouth daily.  01/10/17   [provider]  fluticasone (FLONASE) 50 MCG/ACT nasal spray Place 2 sprays into both nostrils daily for 10 days. 08/09/18 08/19/18  Kara Dies, NP  lamoTRIgine (LAMICTAL) 100 MG tablet Take 1 tablet (100 mg total) by mouth 2 (two) times daily. 03/26/18   Cameron Sprang, MD  levETIRAcetam (KEPPRA XR) 500 MG 24 hr tablet Take 5 tablets (2,500 mg total) by mouth daily. Take 4 tablets daily 03/26/18   Cameron Sprang, MD  levothyroxine (SYNTHROID, LEVOTHROID) 88 MCG tablet Take 88 mcg by mouth daily before breakfast.    [provider]  Melatonin 5 MG TABS Take 15 mg by mouth as needed (sleep).     [provider]  Omega-3 Fatty Acids (OMEGA  3 PO) Take 1 capsule by mouth daily.    [provider]  primidone (MYSOLINE) 50 MG tablet Take 100-150 mg by mouth See admin instructions. Take 100 mg by mouth in the morning and take 150 mg by mouth in the evening    [provider]  propranolol ER (INDERAL LA) 60 MG 24 hr capsule Take 60 mg by mouth daily.  11/30/17   [provider]  sertraline (ZOLOFT) 50 MG tablet Take 3 tablets daily 03/26/18   Cameron Sprang, MD  vancomycin [facility]) 125 MG capsule  01/21/18   [provider]  Vitamin D, Ergocalciferol, (DRISDOL) 1.25 MG (50000 UT) CAPS capsule  02/08/18   [provider]  zolpidem (AMBIEN) 10 MG  tablet TAKE 1 TABLET (10 MG TOTAL) BY MOUTH AT BEDTIME. 02/18/18   Cameron Sprang, MD  clonazePAM (KLONOPIN) 0.5 MG tablet Take 1 tablet (0.5 mg total) by mouth 2 (two) times daily as needed (for seizures). Take 1 tablet as needed for seizure Patient not taking: Reported on 08/09/2018 03/26/18 08/10/18  Cameron Sprang, MD    Family History Family History  Problem Relation Age of Onset  . Heart attack Father 61  . Stroke Neg Hx   . Breast cancer Neg Hx     Social History Social History   Tobacco Use  . Smoking status: Never Smoker  . Smokeless tobacco: Never Used  Substance Use Topics  . Alcohol use: Yes    Comment: socially  . Drug use: No     Allergies   Gluten meal, Other, Plavix [clopidogrel bisulfate], Protonix [pantoprazole], and Penicillins   Review of Systems Review of Systems  Constitutional: Negative for fatigue and fever.  HENT: Negative for ear discharge, ear pain, sore throat, tinnitus, trouble swallowing and voice change.   Eyes: Negative for pain, redness and visual disturbance.  Respiratory: Negative for cough and shortness of breath.   Cardiovascular: Negative for chest pain and palpitations.  Skin: Negative for rash and wound.  Neurological: Negative for syncope and headaches.     Physical Exam Triage Vital Signs ED Triage Vitals [08/10/18 1014]  Enc Vitals Group     BP (!) 166/78     Pulse Rate 63     Resp 18     Temp 98.2 F (36.8 C)     Temp src      SpO2 99 %     Weight      Height      Head Circumference      Peak Flow      Pain Score 0     Pain Loc      Pain Edu?      Excl. in [place]?    No data found.  Updated Vital Signs BP (!) 166/78   Pulse 63   Temp 98.2 F (36.8 C)   Resp 18   SpO2 99%   Visual Acuity Right Eye Distance:   Left Eye Distance:   Bilateral Distance:    Right Eye Near:   Left Eye Near:    Bilateral Near:     Physical Exam Constitutional:      General: She is not in acute distress. HENT:     Head:  Normocephalic and atraumatic.     Right Ear: Tympanic membrane, ear canal and external ear normal.     Ears:     Comments: No tragal tenderness.  Scant, dried bloody discharge in auricle, ear canal.  Ear canal without  significant erythema or edema.  Foreign body visualized.  Unable to visualize TM. Eyes:     General: No scleral icterus.    Pupils: Pupils are equal, round, and reactive to light.  Cardiovascular:     Rate and Rhythm: Normal rate.  Pulmonary:     Effort: Pulmonary effort is normal.  Skin:    Coloration: Skin is not jaundiced or pale.  Neurological:     Mental Status: She is alert and oriented to person, place, and time.      UC Treatments / Results  Labs (all labs ordered are listed, but only abnormal results are displayed) Labs Reviewed - No data to display  EKG   Radiology No results found.  Procedures Procedures (including critical care time)  Medications Ordered in UC Medications - No data to display  Initial Impression / Assessment and Plan / UC Course  I have reviewed the triage vital signs and the nursing notes.  Pertinent labs & imaging results that were available during my care of the patient were reviewed by me and considered in my medical decision making (see chart for details).     1.  Foreign body left ear Due to previous attempts and bleeding that occurred status post this attempt yesterday, retrieval deferred.  Patient to follow-up with ENT for subsequent attempt.  Return precautions discussed, patient verbalized understanding and is agreeable to plan. Final Clinical Impressions(s) / UC Diagnoses   Final diagnoses:  Foreign body of left ear, initial encounter     Discharge Instructions     Do not put anything in ear until you are able to follow-up with ENT for removal.    ED Prescriptions    None     Controlled Substance Prescriptions [place] Controlled Substance Registry consulted? Not Applicable   Quincy Sheehan, [place]  08/10/18 1050

## 2018-08-12 DIAGNOSIS — H903 Sensorineural hearing loss, bilateral: Secondary | ICD-10-CM | POA: Diagnosis not present

## 2018-08-12 DIAGNOSIS — T162XXA Foreign body in left ear, initial encounter: Secondary | ICD-10-CM | POA: Diagnosis not present

## 2018-08-12 DIAGNOSIS — Z974 Presence of external hearing-aid: Secondary | ICD-10-CM | POA: Diagnosis not present

## 2018-08-13 DIAGNOSIS — R7301 Impaired fasting glucose: Secondary | ICD-10-CM | POA: Diagnosis not present

## 2018-08-13 DIAGNOSIS — E039 Hypothyroidism, unspecified: Secondary | ICD-10-CM | POA: Diagnosis not present

## 2018-08-13 DIAGNOSIS — G250 Essential tremor: Secondary | ICD-10-CM | POA: Diagnosis not present

## 2018-08-13 DIAGNOSIS — I5189 Other ill-defined heart diseases: Secondary | ICD-10-CM | POA: Diagnosis not present

## 2018-08-13 DIAGNOSIS — I10 Essential (primary) hypertension: Secondary | ICD-10-CM | POA: Diagnosis not present

## 2018-08-13 DIAGNOSIS — Z1331 Encounter for screening for depression: Secondary | ICD-10-CM | POA: Diagnosis not present

## 2018-08-13 DIAGNOSIS — F418 Other specified anxiety disorders: Secondary | ICD-10-CM | POA: Diagnosis not present

## 2018-08-13 DIAGNOSIS — E785 Hyperlipidemia, unspecified: Secondary | ICD-10-CM | POA: Diagnosis not present

## 2018-08-13 DIAGNOSIS — R569 Unspecified convulsions: Secondary | ICD-10-CM | POA: Diagnosis not present

## 2018-08-13 DIAGNOSIS — I639 Cerebral infarction, unspecified: Secondary | ICD-10-CM | POA: Diagnosis not present

## 2018-08-13 MED FILL — METOPROLOL SUCCINATE ER 25: 25 | 90 days supply | Qty: 90 | Fill #0

## 2018-08-14 ENCOUNTER — Other Ambulatory Visit: Payer: Self-pay

## 2018-08-14 ENCOUNTER — Other Ambulatory Visit: Payer: Self-pay | Admitting: Neurology

## 2018-08-14 MED FILL — ZOLPIDEM TARTRATE 10 MG TAB: 10 | 30 days supply | Qty: 30 | Fill #0

## 2018-08-14 MED FILL — clonazePAM 0.5 MG TABS: 0.5 | 5 days supply | Qty: 10 | Fill #2

## 2018-08-14 MED FILL — LEVETIRACETAM ER 500 MG TAB: 500 | 90 days supply | Qty: 450 | Fill #1

## 2018-08-15 ENCOUNTER — Ambulatory Visit (INDEPENDENT_AMBULATORY_CARE_PROVIDER_SITE_OTHER): Payer: 59 | Admitting: Psychology

## 2018-08-15 DIAGNOSIS — F324 Major depressive disorder, single episode, in partial remission: Secondary | ICD-10-CM

## 2018-08-25 MED FILL — PRIMIDONE 50 MG TABLET: 50 | 30 days supply | Qty: 150 | Fill #3

## 2018-08-29 IMAGING — CT CT HEAD W/O CM
3 of 4 series · 17 of 47 positions shown, 20 images · non-contrast
Comparison: CT brain scan of 02/14/2016

CLINICAL DATA: Slurred speech, history of prior neurosurgery

EXAM:
CT HEAD WITHOUT CONTRAST
TECHNIQUE: Contiguous axial images were obtained from the base of the skull
through the vertex without intravenous contrast.

[Series 201: head w/o, idose (1) · axial · non-contrast · 0.43mm/px · z∈[+158,+288]mm · 11 of 32 slices shown, 14 images]
[im 3/32  brain]
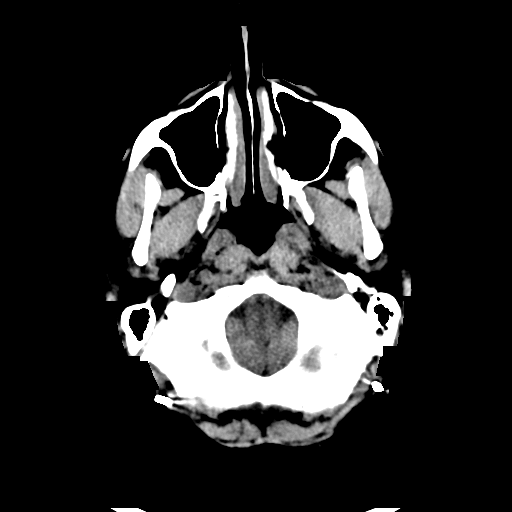
[im 3/32  bone]
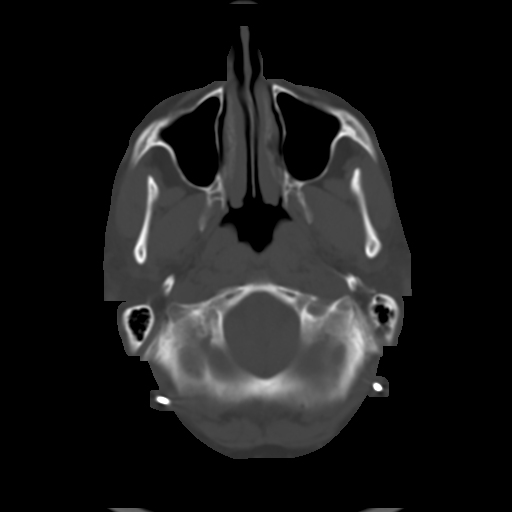
[im 5/32  brain]
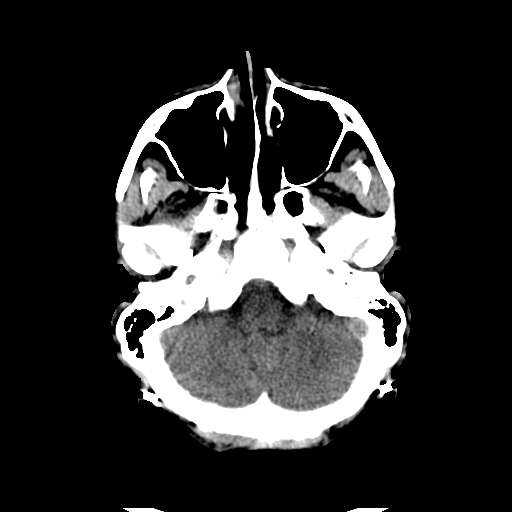
[im 7/32  brain]
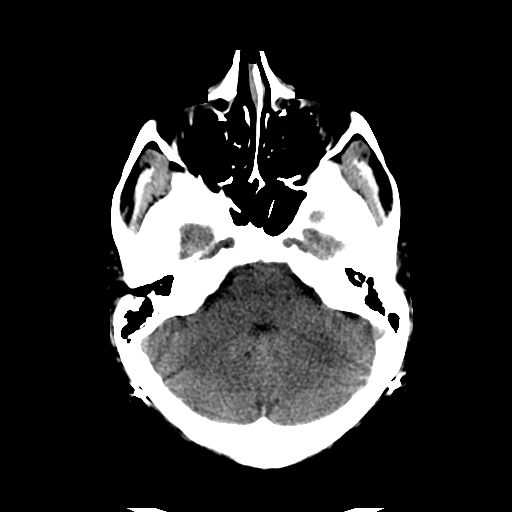
[im 12/32  brain]
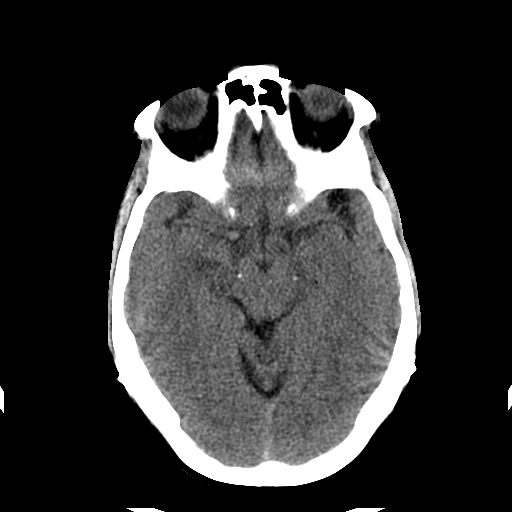
[im 14/32  brain]
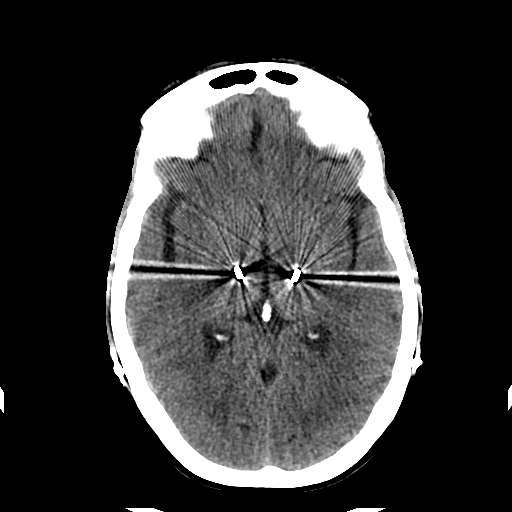
[im 14/32  bone]
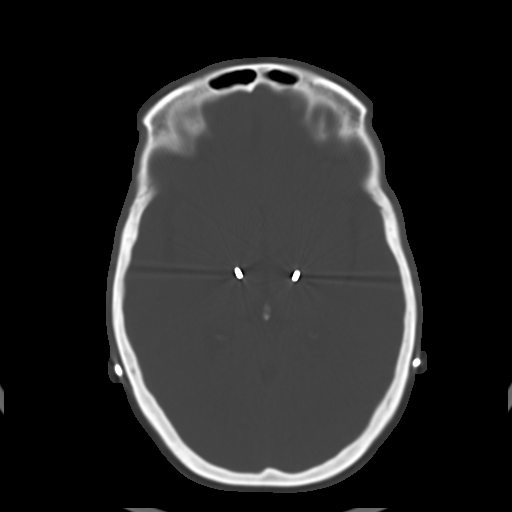
[im 16/32  brain]
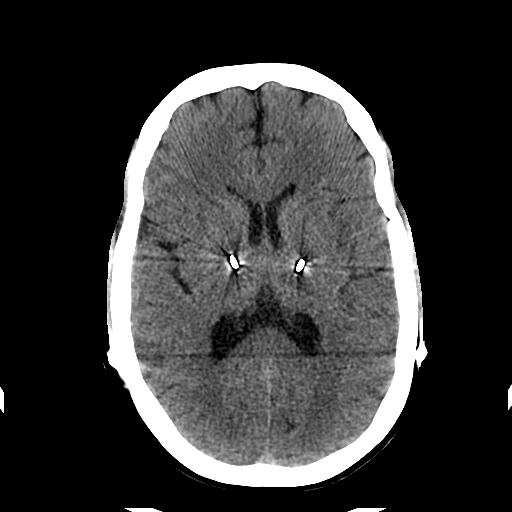
[im 18/32  brain]
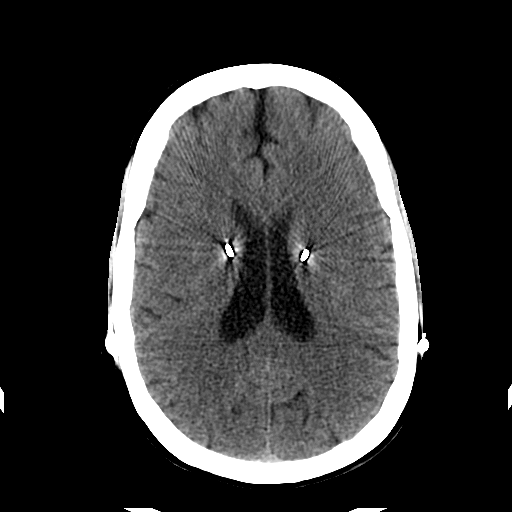
[im 20/32  brain]
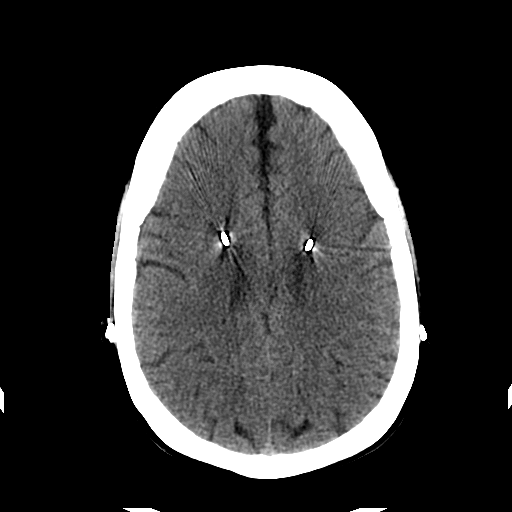
[im 25/32  brain]
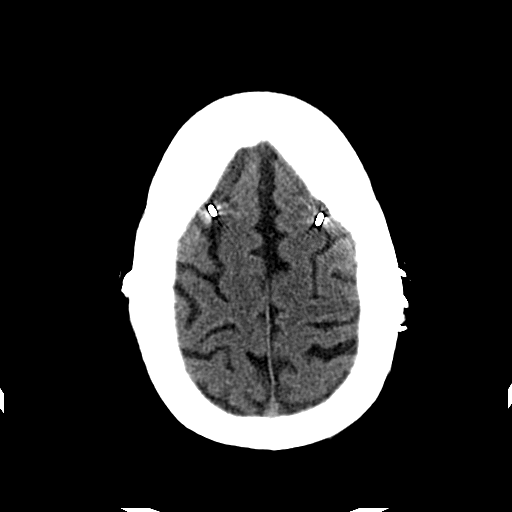
[im 25/32  bone]
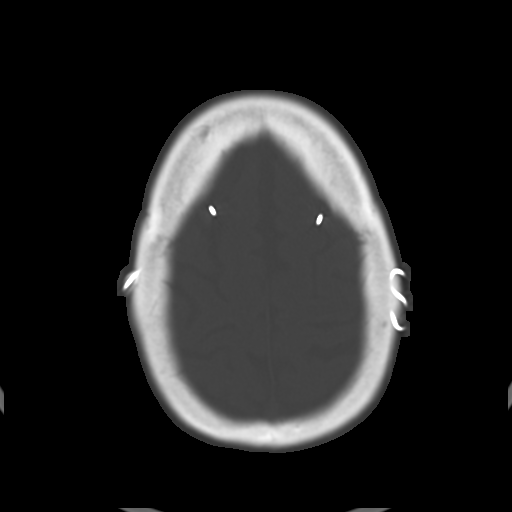
[im 27/32  brain]
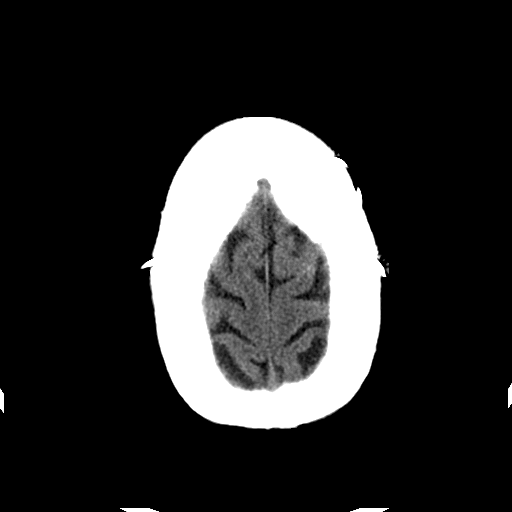
[im 29/32  brain]
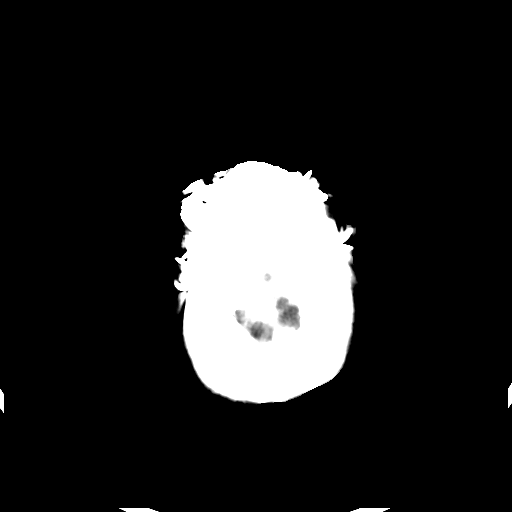

[Series 203: coronal st, idose (1) · coronal · 0.40mm/px · 3 of 73 slices shown]
[im 25/73  brain]
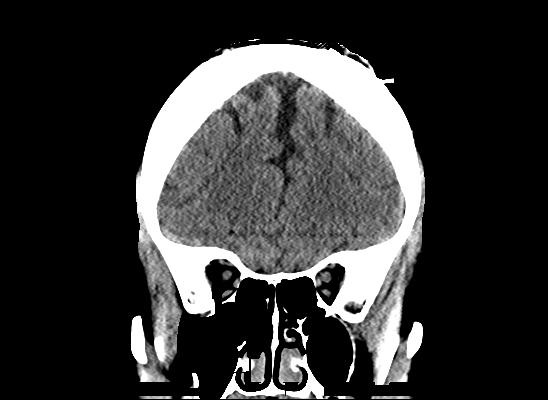
[im 33/73  brain]
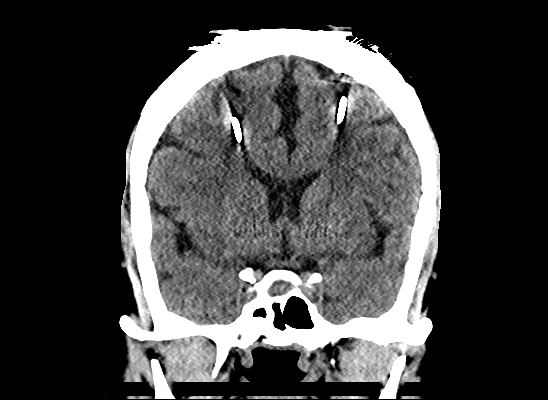
[im 41/73  brain]
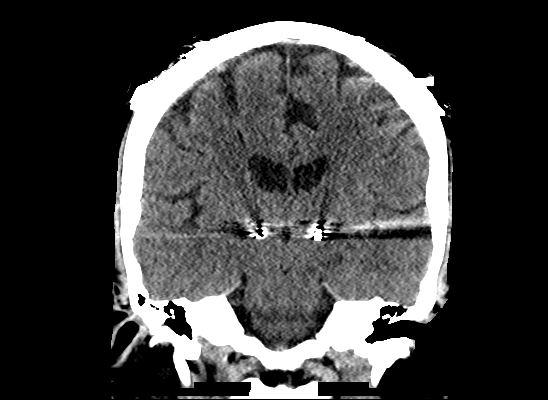

[Series 204: sagittal st, idose (1) · sagittal · 0.40mm/px · 3 of 73 slices shown]
[im 25/73  brain]
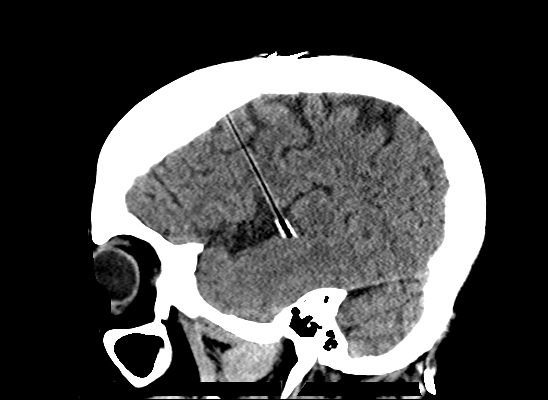
[im 37/73  brain]
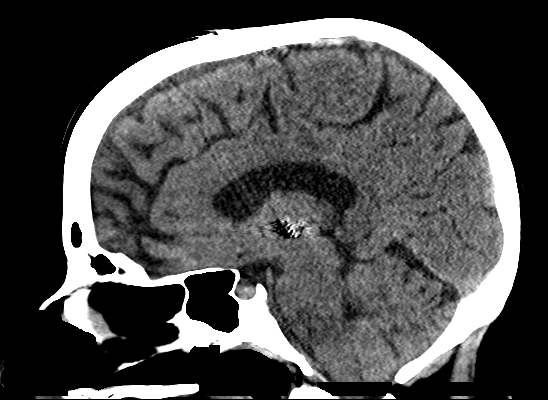
[im 49/73  brain]
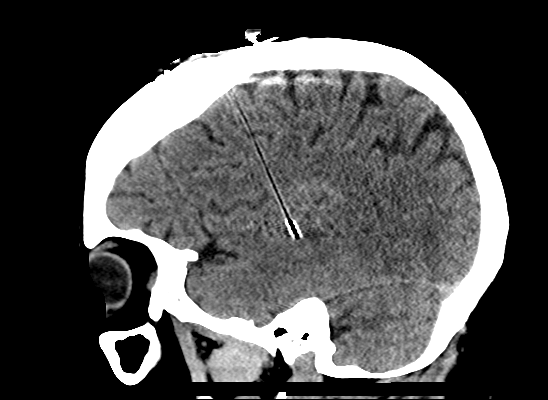

[17 of 47 positions shown; findings below may reference images not displayed]

FINDINGS: Brain: The ventricular system is unchanged in size and configuration
and the septum remains midline in position. Deep brain stimulator
electrodes are unchanged in position. The fourth ventricle and
basilar cisterns appear normal. No hemorrhage, mass lesion, or acute
infarction is seen.

Vascular: No vascular abnormality is seen on this unenhanced study.

Skull: No calvarial abnormality is noted.

Sinuses/Orbits: The paranasal sinuses that are visualized are
pneumatized.

Other: None.
IMPRESSION: 1. Negative unenhanced CT of the brain for acute abnormality.
2. Deep brain stimulator electrodes are unchanged in position.

## 2018-09-09 MED FILL — PROPRANOLOL HCL ER 60 MG CP: 60 | 30 days supply | Qty: 30 | Fill #9

## 2018-09-10 MED FILL — ZOLPIDEM TARTRATE 10 MG TAB: 10 | 30 days supply | Qty: 30 | Fill #1

## 2018-09-13 DIAGNOSIS — H903 Sensorineural hearing loss, bilateral: Secondary | ICD-10-CM | POA: Diagnosis not present

## 2018-09-17 DIAGNOSIS — K635 Polyp of colon: Secondary | ICD-10-CM | POA: Diagnosis not present

## 2018-09-17 DIAGNOSIS — K6289 Other specified diseases of anus and rectum: Secondary | ICD-10-CM | POA: Diagnosis not present

## 2018-09-17 DIAGNOSIS — Z86010 Personal history of colonic polyps: Secondary | ICD-10-CM | POA: Diagnosis not present

## 2018-09-23 MED FILL — SERTRALINE HCL 50 MG TABLET: 50 | 90 days supply | Qty: 270 | Fill #2

## 2018-09-24 MED FILL — PRIMIDONE 50 MG TABLET: 50 | 30 days supply | Qty: 150 | Fill #4

## 2018-10-07 MED FILL — DICYCLOMINE 10 MG CAPSULE: 10 | 30 days supply | Qty: 180 | Fill #0

## 2018-10-08 DIAGNOSIS — G250 Essential tremor: Secondary | ICD-10-CM | POA: Diagnosis not present

## 2018-10-08 DIAGNOSIS — Z4542 Encounter for adjustment and management of neuropacemaker (brain) (peripheral nerve) (spinal cord): Secondary | ICD-10-CM | POA: Diagnosis not present

## 2018-10-08 DIAGNOSIS — Z9689 Presence of other specified functional implants: Secondary | ICD-10-CM | POA: Diagnosis not present

## 2018-10-08 MED FILL — EZETIMIBE 10 MG TABS: 10 | 90 days supply | Qty: 90 | Fill #1

## 2018-10-09 MED FILL — PROPRANOLOL HCL ER 60 MG CP: 60 | 30 days supply | Qty: 30 | Fill #10

## 2018-10-13 MED FILL — ZOLPIDEM TARTRATE 10 MG TAB: 10 | 30 days supply | Qty: 30 | Fill #2

## 2018-10-14 ENCOUNTER — Other Ambulatory Visit: Payer: Self-pay | Admitting: *Deleted

## 2018-10-14 ENCOUNTER — Other Ambulatory Visit: Payer: Self-pay | Admitting: Neurology

## 2018-10-14 MED ORDER — CLONAZEPAM 0.5 MG PO TABS: ORAL_TABLET | ORAL | 3 refills | Status: DC

## 2018-10-14 MED FILL — VIT D2 1.25 MG (50,000 UNIT: 1.25 MG | 84 days supply | Qty: 24 | Fill #0

## 2018-10-14 NOTE — Telephone Encounter (Signed)
Dr Delice Lesch - I see you put in the new order for the clonazepam daily prn.   I am unable to cancel the unsigned order on this encounter where it says prn BID (probably as controlled) - will you please cancel it? Thank you.

## 2018-10-14 NOTE — Telephone Encounter (Addendum)
Alyssa Mccarthy at [facility] [phone] #[phone] ) called to verify frequency of clonazepam 0.5 mg . It is prn seizures but they needed a frequency on prescription.   Asked Dr. Delice Lesch and she stated prn DAILY ( NOT BID) - MD will change in computer.

## 2018-10-17 MED FILL — clonazePAM 0.5 MG TABS: 0.5 | 22 days supply | Qty: 10 | Fill #0

## 2018-10-23 MED FILL — SYNTHROID 88 MCG TABLET: 88 | 90 days supply | Qty: 90 | Fill #2

## 2018-10-23 MED FILL — SUBVENITE 100 MG TABS: 100 | 90 days supply | Qty: 180 | Fill #2

## 2018-10-23 MED FILL — ATORVASTATIN 40 MG TABLET: 40 | 90 days supply | Qty: 90 | Fill #1

## 2018-10-25 MED FILL — PRIMIDONE 50 MG TABLET: 50 | 30 days supply | Qty: 150 | Fill #5

## 2018-10-29 ENCOUNTER — Other Ambulatory Visit: Payer: Self-pay

## 2018-10-29 ENCOUNTER — Ambulatory Visit (INDEPENDENT_AMBULATORY_CARE_PROVIDER_SITE_OTHER): Payer: 59 | Admitting: Neurology

## 2018-10-29 ENCOUNTER — Encounter: Payer: Self-pay | Admitting: Neurology

## 2018-10-29 VITALS — BP 157/79 | HR 51 | Ht 62.0 in | Wt 163.1 lb

## 2018-10-29 DIAGNOSIS — G40309 Generalized idiopathic epilepsy and epileptic syndromes, not intractable, without status epilepticus: Secondary | ICD-10-CM | POA: Diagnosis not present

## 2018-10-29 DIAGNOSIS — G4700 Insomnia, unspecified: Secondary | ICD-10-CM | POA: Diagnosis not present

## 2018-10-29 DIAGNOSIS — F329 Major depressive disorder, single episode, unspecified: Secondary | ICD-10-CM

## 2018-10-29 DIAGNOSIS — F32A Depression, unspecified: Secondary | ICD-10-CM

## 2018-10-29 MED ORDER — ZOLPIDEM TARTRATE 10 MG PO TABS: 10.0000 mg | ORAL_TABLET | Freq: Every day | ORAL | 5 refills | Status: DC

## 2018-10-29 MED ORDER — LAMOTRIGINE 100 MG PO TABS: 100.0000 mg | ORAL_TABLET | Freq: Two times a day (BID) | ORAL | 3 refills | Status: DC

## 2018-10-29 MED ORDER — CLONAZEPAM 0.5 MG PO TABS: ORAL_TABLET | ORAL | 5 refills | Status: DC

## 2018-10-29 MED ORDER — SERTRALINE HCL 50 MG PO TABS: ORAL_TABLET | ORAL | 3 refills | Status: DC

## 2018-10-29 MED ORDER — DIVALPROEX SODIUM ER 500 MG PO TB24: 500.0000 mg | ORAL_TABLET | Freq: Every day | ORAL | 3 refills | Status: DC

## 2018-10-29 MED ORDER — LEVETIRACETAM ER 500 MG PO TB24: 2500.0000 mg | ORAL_TABLET | Freq: Every day | ORAL | 3 refills | Status: DC

## 2018-10-29 NOTE — Progress Notes (Signed)
NEUROLOGY FOLLOW UP OFFICE NOTE  Alyssa Mccarthy YX:[id number] 12-19-1965  HISTORY OF PRESENT ILLNESS: I had the pleasure of seeing Alyssa Mccarthy in the neurology clinic on 10/29/2018.  The patient was last seen 7 months ago for seizures. She is alone in the office today. She has been doing well seizure-free since 2015. On her last visit, we discussed streamlining medications and reduced Keppra XR from 2500mg  daily to 2000mg  daily. She is also on Lamictal 100mg  BID and Depakote ER 500mg  qhs. She takes Primidone 100mg  in AM, 150mg  in PM for essential tremor. She was recently seen at [facility] where DBS was adjusted, tremor doing better. Since her last visit, she reports that she has had 2-3 times in the past 2 months where she would get the overwhelming feeling of exhaustion and feel something is not right, take clonazepam and go to sleep. This is more frequent than before. She has also noticed that she has been needing higher doses of melatonin (15mg ) to take with Ambien 10mg  qhs, otherwise it takes longer to sleep or she wakes up several times. She denies any staring/unresponsive episodes, gaps in time, olfactory/gustatory hallucinations, focal numbness/tingling/weakness, myoclonic jerks. No headaches, dizziness, vision changes, no falls. Mood is good on Zoloft 150mg  daily, she has noticed mood is better with her daughter going back to college.  History on Initial Assessment 01/29/2017: This is a pleasant 53 yo RH woman with a history of idiopathic generalized epilepsy, essential tremor s/p VIM DBS, presenting to establish care. She had been seeing epileptologist Dr. [name] at [facility], records were reviewed. She sees Movement Disorder specialist Dr. [name] for the essential tremor. Seizures started around age 53. She reports seizures were well-controlled for 25 years, then after she had her daughter, she was having more seizures for 3-4 years, then overall has been well-controlled the past 10 years. She has  no clear aura, describes body jerking that progresses to a generalized convulsion. Flashing lights and sleep deprivation are seizure triggers. She has not had any seizures since around 2015 when Depakote was re-added to her regimen. She is currently on Lamictal 100mg  BID, Keppra XR 2500mg  daily, and Depakote ER 500mg  daily with no side effects. She had been on Depakote for many years in the past but had weight gain, it was stopped, then restarted around 4 years ago. She is also on Primidone for the tremor. She denies any olfactory/gustatory hallucinations, deja vu, rising epigastric sensation, focal numbness/tingling/weakness, myoclonic jerks. She rarely has a funny feeling that "something is not quite right" and would take a prn clonazepam as a precaution, she has not needed this in about a year.   She denies any headaches, dizziness, diplopia, dysarthria/dysphagia, neck/back pain, bowel/bladder dysfunction. Sleep is good. She had been taking Zoloft increased dose 4 years ago due to irritability, improved with higher dose. She was diagnosed with a stroke in January 2018 when she had a bad headache and anisocoria. MRI brain showed subtle T2 hyperintensity in the pons, R>L. She saw Dr. 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 She has chronic mild right-sided sensory changes since then. She has also had symptoms every once in a while where there is something she can't see (not clearly on peripheral vision), she blinks and vision is back to normal. She has chronic numbness on the left elbow region. She has noticed she has no interest in eating, but makes herself eat. 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  Diagnostic Data: MRI brain in March 2011 reported as normal. EEG in March 2010 abnormal due to generalized polyspike and wave, consistent with a primary generalized epilepsy Prior AEDs: Phenobarbital, Zonisamide, Dilantin. On a different generic Levetiracetam, she had dry hands  and peeling in fingertips, which resolved after switching to a different generic  Epilepsy Risk Factors:  There is a strong family history of seizures in her paternal grandfather, father, and daughter. Otherwise she had a normal birth and early development.  There is no history of febrile convulsions, CNS infections such as meningitis/encephalitis, significant traumatic brain injury, neurosurgical procedures.  PAST MEDICAL HISTORY: Past Medical History:  Diagnosis Date   Complication of anesthesia    Essential tremor    Hyperlipidemia    Hypothyroidism    PONV (postoperative nausea and vomiting)    Seizure disorder (HCC)    Stroke [facility])     MEDICATIONS: Current Outpatient Medications on File Prior to Visit  Medication Sig Dispense Refill   aspirin EC 325 MG EC tablet Take 1 tablet (325 mg total) by mouth daily. 30 tablet 1   atorvastatin (LIPITOR) 40 MG tablet Take 1 tablet (40 mg total) by mouth every evening. 30 tablet 1   clonazePAM (KLONOPIN) 0.5 MG tablet Take 1 tablet daily, as needed for seizure. Do not use more than 2-3 a week. 10 tablet 3   divalproex (DEPAKOTE ER) 500 MG 24 hr tablet Take 1 tablet (500 mg total) by mouth at bedtime. 90 tablet 3   ergocalciferol (VITAMIN D2) 1.25 MG (50000 UT) capsule ergocalciferol (vitamin D2) 1,250 mcg (50,000 unit) capsule     ezetimibe (ZETIA) 10 MG tablet Take 10 mg by mouth daily.      fluticasone (FLONASE) 50 MCG/ACT nasal spray Place 2 sprays into both nostrils daily for 10 days. 16 g 0   lamoTRIgine (LAMICTAL) 100 MG tablet Take 1 tablet (100 mg total) by mouth 2 (two) times daily. 180 tablet 3   levETIRAcetam (KEPPRA XR) 500 MG 24 hr tablet Take 5 tablets (2,500 mg total) by mouth daily. Take 4 tablets daily 360 tablet 3   levothyroxine (SYNTHROID, LEVOTHROID) 88 MCG tablet Take 88 mcg by mouth daily before breakfast.     Melatonin 5 MG TABS Take 15 mg by mouth as needed (sleep).      Omega-3 Fatty Acids (OMEGA 3  PO) Take 1 capsule by mouth daily.     primidone (MYSOLINE) 50 MG tablet Take 100-150 mg by mouth See admin instructions. Take 100 mg by mouth in the morning and take 150 mg by mouth in the evening     propranolol ER (INDERAL LA) 60 MG 24 hr capsule Take 60 mg by mouth daily.      sertraline (ZOLOFT) 50 MG tablet Take 3 tablets daily 270 tablet 3   vancomycin (VANCOCIN) 125 MG capsule      Vitamin D, Ergocalciferol, (DRISDOL) 1.25 MG (50000 UT) CAPS capsule      zolpidem (AMBIEN) 10 MG tablet TAKE 1 TABLET (10 MG TOTAL) BY MOUTH AT BEDTIME. 30 tablet 5   No current facility-administered medications on file prior to visit.     ALLERGIES: Allergies  Allergen Reactions   Gluten Meal Diarrhea and Other (See Comments)    Severe diarrhea   Other Other (See Comments)    Bleach-respiratory distress   Plavix [Clopidogrel Bisulfate] Itching, Rash and Other (See Comments)    Hands and feet    Protonix [Pantoprazole] Itching and Other (See Comments)  Whelps   Penicillins Itching, Rash and Other (See Comments)    Childhood allergy- specifics unknown; tolerated amoxicillin and ancef since then     FAMILY HISTORY: Family History  Problem Relation Age of Onset   Heart attack Father 79   Stroke Neg Hx    Breast cancer Neg Hx     SOCIAL HISTORY: Social History   Socioeconomic History   Marital status: Married    Spouse name: Not on file   Number of children: Not on file   Years of education: Not on file   Highest education level: Not on file  Occupational History   Not on file  Social Needs   Financial resource strain: Not on file   Food insecurity    Worry: Not on file    Inability: Not on file   Transportation needs    Medical: Not on file    Non-medical: Not on file  Tobacco Use   Smoking status: Never Smoker   Smokeless tobacco: Never Used  Substance and Sexual Activity   Alcohol use: Yes    Comment: socially   Drug use: No   Sexual activity:  Not on file  Lifestyle   Physical activity    Days per week: Not on file    Minutes per session: Not on file   Stress: Not on file  Relationships   Social connections    Talks on phone: Not on file    Gets together: Not on file    Attends religious service: Not on file    Active member of club or organization: Not on file    Attends meetings of clubs or organizations: Not on file    Relationship status: Not on file   Intimate partner violence    Fear of current or ex partner: Not on file    Emotionally abused: Not on file    Physically abused: Not on file    Forced sexual activity: Not on file  Other Topics Concern   Not on file  Social History Narrative   Lives in 1 story home with her husband and 2 sons   Has 2 adult sons   Has bach. Degree   Works as Designer, jewellery    REVIEW OF SYSTEMS: Constitutional: No fevers, chills, or sweats, no generalized fatigue, change in appetite Eyes: No visual changes, double vision, eye pain Ear, nose and throat: No hearing loss, ear pain, nasal congestion, sore throat Cardiovascular: No chest pain, palpitations Respiratory:  No shortness of breath at rest or with exertion, wheezes GastrointestinaI: No nausea, vomiting, diarrhea, abdominal pain, fecal incontinence Genitourinary:  No dysuria, urinary retention or frequency Musculoskeletal:  No neck pain, back pain Integumentary: No rash, pruritus, skin lesions Neurological: as above Psychiatric: No depression, +insomnia, no anxiety Endocrine: No palpitations, fatigue, diaphoresis, mood swings, change in appetite, change in weight, increased thirst Hematologic/Lymphatic:  No anemia, purpura, petechiae. Allergic/Immunologic: no itchy/runny eyes, nasal congestion, recent allergic reactions, rashes  PHYSICAL EXAM: Vitals:   10/29/18 1558  BP: (!) 157/79  Pulse: (!) 51  SpO2: 100%   General: No acute distress Head:  Normocephalic/atraumatic Skin/Extremities: No rash, no  edema Neurological Exam: alert and oriented to person, place, and time. No aphasia or dysarthria. Fund of knowledge is appropriate.  Recent and remote memory are intact.  Attention and concentration are normal.    Able to name objects and repeat phrases. Cranial nerves: Pupils asymmetric, R>L (chronic).  Extraocular movements intact with no nystagmus. Visual fields full.  Facial sensation intact. No facial asymmetry. Tongue, uvula, palate midline.  Motor: Bulk and tone normal, muscle strength 5/5 throughout with no pronator drift.  Deep tendon reflexes 2+ throughout, toes downgoing.  Finger to nose testing intact.  Gait narrow-based and steady, able to tandem walk adequately.  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 She has noticed an increase in episodes of fatigue/feeling unwell and needing clonazepam as we had slightly reduced Keppra XR dose. She will resume Keppra XR 2500mg  daily, in addition to Lamotrigine 100mg  BID and Depakote ER 500mg  qhs. Monitor if this also improves sleep, she was advised to reduce melatonin dose. If sleep issues continue, she agrees to referral to Sleep Medicine. Continue Ambien 10mg  qhs and Zoloft 150mg  daily. She is aware of [place] driving laws to stop driving after a seizure until 6 months seizure-free. She will Mccarthy in 6 months and knows to call for any changes.   Thank you for allowing me to participate in her care.  Please do not hesitate to call for any questions or concerns.   Ellouise Newer, M.D.   CC: Dr. Forde Dandy

## 2018-10-29 NOTE — Patient Instructions (Signed)
1. Increase Keppra XR 500mg : Take 5 tablets daily 2. Continue all your other medications 3. Keep a calendar of your symptoms, monitor sleep with going back on prior dose of Keppra 4. Follow-up in 6 months, call for any changes  Seizure Precautions: 1. If medication has been prescribed for you to prevent seizures, take it exactly as directed.  Do not stop taking the medicine without talking to your doctor first, even if you have not had a seizure in a long time.   2. Avoid activities in which a seizure would cause danger to yourself or to others.  Don't operate dangerous machinery, swim alone, or climb in high or dangerous places, such as on ladders, roofs, or girders.  Do not drive unless your doctor says you may.  3. If you have any warning that you may have a seizure, lay down in a safe place where you can't hurt yourself.    4.  No driving for 6 months from last seizure, as per [facility].   Please refer to the following link on the [facility] website for more information: [email or web address]   5.  Maintain good sleep hygiene. Avoid alcohol.  6.  Contact your doctor if you have any problems that may be related to the medicine you are taking.  7.  Call 911 and bring the patient back to the ED if:        A.  The seizure lasts longer than 5 minutes.       B.  The patient doesn't awaken shortly after the seizure  C.  The patient has new problems such as difficulty seeing, speaking or moving  D.  The patient was injured during the seizure  E.  The patient has a temperature over 102 F (39C)  F.  The patient vomited and now is having trouble breathing       '

## 2018-10-30 MED ORDER — ZOLPIDEM TARTRATE 10 MG PO TABS: 10.0000 mg | ORAL_TABLET | Freq: Every day | ORAL | 5 refills | Status: DC

## 2018-10-31 MED FILL — DICYCLOMINE 10 MG CAPSULE: 10 | 30 days supply | Qty: 180 | Fill #1

## 2018-11-05 MED FILL — METOPROLOL SUCCINATE ER 25: 25 | 90 days supply | Qty: 90 | Fill #1

## 2018-11-08 MED FILL — PROPRANOLOL HCL ER 60 MG CP: 60 | 30 days supply | Qty: 30 | Fill #11

## 2018-11-11 MED FILL — ZOLPIDEM TARTRATE 10 MG TAB: 10 | 30 days supply | Qty: 30 | Fill #3

## 2018-11-25 MED FILL — PRIMIDONE 50 MG TABLET: 50 | 30 days supply | Qty: 150 | Fill #0

## 2018-11-28 MED FILL — clonazePAM 0.5 MG TABS: 0.5 | 22 days supply | Qty: 10 | Fill #1

## 2018-11-28 MED FILL — LEVETIRACETAM ER 500 MG TAB: 500 | 90 days supply | Qty: 450 | Fill #2

## 2018-12-09 MED FILL — PROPRANOLOL HCL ER 60 MG CP: 60 | 30 days supply | Qty: 30 | Fill #0

## 2018-12-10 DIAGNOSIS — K582 Mixed irritable bowel syndrome: Secondary | ICD-10-CM | POA: Diagnosis not present

## 2018-12-10 DIAGNOSIS — R140 Abdominal distension (gaseous): Secondary | ICD-10-CM | POA: Diagnosis not present

## 2018-12-10 DIAGNOSIS — Z86010 Personal history of colonic polyps: Secondary | ICD-10-CM | POA: Diagnosis not present

## 2018-12-12 MED FILL — ZOLPIDEM TARTRATE 10 MG TAB: 10 | 30 days supply | Qty: 30 | Fill #4

## 2018-12-20 MED FILL — PRIMIDONE 50 MG TABLET: 50 | 30 days supply | Qty: 150 | Fill #1

## 2018-12-24 MED FILL — SERTRALINE HCL 50 MG TABLET: 50 | 90 days supply | Qty: 270 | Fill #3

## 2018-12-27 MED FILL — AMLODIPINE BESYLATE 5 MG TA: 5 | 90 days supply | Qty: 90 | Fill #0

## 2018-12-31 MED FILL — VIT D2 1.25 MG (50,000 UNIT: 1.25 MG | 84 days supply | Qty: 24 | Fill #1

## 2019-01-02 MED FILL — PROPRANOLOL HCL ER 60 MG CP: 60 | 30 days supply | Qty: 30 | Fill #1

## 2019-01-04 ENCOUNTER — Other Ambulatory Visit: Payer: Self-pay

## 2019-01-04 DIAGNOSIS — Z20822 Contact with and (suspected) exposure to covid-19: Secondary | ICD-10-CM

## 2019-01-05 LAB — NOVEL CORONAVIRUS, NAA: SARS-CoV-2, NAA: NOT DETECTED

## 2019-01-05 LAB — SPECIMEN STATUS REPORT

## 2019-01-06 MED FILL — EZETIMIBE 10 MG TABS: 10 | 90 days supply | Qty: 90 | Fill #0

## 2019-01-08 MED FILL — ZOLPIDEM TARTRATE 10 MG TAB: 10 | 30 days supply | Qty: 30 | Fill #5

## 2019-01-18 MED FILL — PRIMIDONE 50 MG TABLET: 50 | 30 days supply | Qty: 150 | Fill #2

## 2019-01-19 MED FILL — SYNTHROID 88 MCG TABLET: 88 | 90 days supply | Qty: 90 | Fill #3

## 2019-01-19 MED FILL — ATORVASTATIN 40 MG TABLET: 40 | 90 days supply | Qty: 90 | Fill #2

## 2019-01-20 MED FILL — LAMOTRIGINE 100 MG TABS: 100 | 90 days supply | Qty: 180 | Fill #3

## 2019-01-21 MED FILL — DIVALPROEX SOD ER 500 MG TA: 500 | 90 days supply | Qty: 90 | Fill #1

## 2019-02-01 MED FILL — PROPRANOLOL HCL ER 60 MG CP: 60 | 30 days supply | Qty: 30 | Fill #2

## 2019-02-10 MED FILL — ZOLPIDEM TARTRATE 10 MG TAB: 10 | 30 days supply | Qty: 30 | Fill #0

## 2019-02-11 DIAGNOSIS — D3500 Benign neoplasm of unspecified adrenal gland: Secondary | ICD-10-CM | POA: Diagnosis not present

## 2019-02-11 DIAGNOSIS — E039 Hypothyroidism, unspecified: Secondary | ICD-10-CM | POA: Diagnosis not present

## 2019-02-11 DIAGNOSIS — R569 Unspecified convulsions: Secondary | ICD-10-CM | POA: Diagnosis not present

## 2019-02-11 DIAGNOSIS — R7301 Impaired fasting glucose: Secondary | ICD-10-CM | POA: Diagnosis not present

## 2019-02-11 DIAGNOSIS — F418 Other specified anxiety disorders: Secondary | ICD-10-CM | POA: Diagnosis not present

## 2019-02-11 DIAGNOSIS — I10 Essential (primary) hypertension: Secondary | ICD-10-CM | POA: Diagnosis not present

## 2019-02-11 DIAGNOSIS — I700 Atherosclerosis of aorta: Secondary | ICD-10-CM | POA: Diagnosis not present

## 2019-02-11 DIAGNOSIS — E559 Vitamin D deficiency, unspecified: Secondary | ICD-10-CM | POA: Diagnosis not present

## 2019-02-11 DIAGNOSIS — I5189 Other ill-defined heart diseases: Secondary | ICD-10-CM | POA: Diagnosis not present

## 2019-02-17 MED FILL — PRIMIDONE 50 MG TABLET: 50 | 30 days supply | Qty: 150 | Fill #3

## 2019-03-03 MED FILL — PROPRANOLOL HCL ER 60 MG CP: 60 | 30 days supply | Qty: 30 | Fill #3

## 2019-03-03 MED FILL — LEVETIRACETAM ER 500 MG TAB: 500 | 90 days supply | Qty: 450 | Fill #3

## 2019-03-06 DIAGNOSIS — G40909 Epilepsy, unspecified, not intractable, without status epilepticus: Secondary | ICD-10-CM | POA: Diagnosis not present

## 2019-03-06 DIAGNOSIS — Z20822 Contact with and (suspected) exposure to covid-19: Secondary | ICD-10-CM | POA: Diagnosis not present

## 2019-03-06 DIAGNOSIS — G250 Essential tremor: Secondary | ICD-10-CM | POA: Diagnosis not present

## 2019-03-06 DIAGNOSIS — Z4542 Encounter for adjustment and management of neuropacemaker (brain) (peripheral nerve) (spinal cord): Secondary | ICD-10-CM | POA: Diagnosis not present

## 2019-03-06 DIAGNOSIS — Z01812 Encounter for preprocedural laboratory examination: Secondary | ICD-10-CM | POA: Diagnosis not present

## 2019-03-06 DIAGNOSIS — Z9682 Presence of neurostimulator: Secondary | ICD-10-CM | POA: Diagnosis not present

## 2019-03-12 DIAGNOSIS — F329 Major depressive disorder, single episode, unspecified: Secondary | ICD-10-CM | POA: Diagnosis not present

## 2019-03-12 DIAGNOSIS — I10 Essential (primary) hypertension: Secondary | ICD-10-CM | POA: Diagnosis not present

## 2019-03-12 DIAGNOSIS — E039 Hypothyroidism, unspecified: Secondary | ICD-10-CM | POA: Diagnosis not present

## 2019-03-12 DIAGNOSIS — G40909 Epilepsy, unspecified, not intractable, without status epilepticus: Secondary | ICD-10-CM | POA: Diagnosis not present

## 2019-03-12 DIAGNOSIS — Z4542 Encounter for adjustment and management of neuropacemaker (brain) (peripheral nerve) (spinal cord): Secondary | ICD-10-CM | POA: Diagnosis not present

## 2019-03-12 DIAGNOSIS — T85193A Other mechanical complication of implanted electronic neurostimulator, generator, initial encounter: Secondary | ICD-10-CM | POA: Diagnosis not present

## 2019-03-12 DIAGNOSIS — E785 Hyperlipidemia, unspecified: Secondary | ICD-10-CM | POA: Diagnosis not present

## 2019-03-12 DIAGNOSIS — G4700 Insomnia, unspecified: Secondary | ICD-10-CM | POA: Diagnosis not present

## 2019-03-12 DIAGNOSIS — G250 Essential tremor: Secondary | ICD-10-CM | POA: Diagnosis not present

## 2019-03-12 DIAGNOSIS — I69392 Facial weakness following cerebral infarction: Secondary | ICD-10-CM | POA: Diagnosis not present

## 2019-03-12 MED FILL — ZOLPIDEM TARTRATE 10 MG TAB: 10 | 30 days supply | Qty: 30 | Fill #1

## 2019-03-13 MED FILL — HYDROCODON-APAP 5-325: 5-325 | 5 days supply | Qty: 20 | Fill #0

## 2019-03-19 MED FILL — PRIMIDONE 50 MG TABLET: 50 | 30 days supply | Qty: 150 | Fill #4

## 2019-03-21 MED FILL — AMLODIPINE BESYLATE 5 MG TA: 5 | 90 days supply | Qty: 90 | Fill #1

## 2019-03-22 MED FILL — SERTRALINE HCL 50 MG TABLET: 50 | 90 days supply | Qty: 270 | Fill #0

## 2019-03-25 MED FILL — VIT D2 1.25 MG (50,000 UNIT: 1.25 MG | 84 days supply | Qty: 24 | Fill #2

## 2019-03-31 MED FILL — EZETIMIBE 10 MG TABS: 10 | 90 days supply | Qty: 90 | Fill #1

## 2019-03-31 MED FILL — clonazePAM 0.5 MG TABS: 0.5 | 22 days supply | Qty: 10 | Fill #2

## 2019-04-02 MED FILL — PROPRANOLOL HCL ER 60 MG CP: 60 | 30 days supply | Qty: 30 | Fill #4

## 2019-04-10 DIAGNOSIS — Z9682 Presence of neurostimulator: Secondary | ICD-10-CM | POA: Diagnosis not present

## 2019-04-10 DIAGNOSIS — Z4542 Encounter for adjustment and management of neuropacemaker (brain) (peripheral nerve) (spinal cord): Secondary | ICD-10-CM | POA: Diagnosis not present

## 2019-04-10 DIAGNOSIS — G250 Essential tremor: Secondary | ICD-10-CM | POA: Diagnosis not present

## 2019-04-10 DIAGNOSIS — Z9689 Presence of other specified functional implants: Secondary | ICD-10-CM | POA: Diagnosis not present

## 2019-04-10 MED FILL — ZOLPIDEM TARTRATE 10 MG TAB: 10 | 30 days supply | Qty: 30 | Fill #2

## 2019-04-18 MED FILL — PRIMIDONE 50 MG TABLET: 50 | 30 days supply | Qty: 150 | Fill #5

## 2019-04-19 MED FILL — SYNTHROID 88 MCG TABLET: 88 | 90 days supply | Qty: 90 | Fill #0

## 2019-04-19 MED FILL — LAMOTRIGINE 100 MG TABS: 100 | 90 days supply | Qty: 180 | Fill #0

## 2019-04-21 MED FILL — ATORVASTATIN 40 MG TABLET: 40 | 90 days supply | Qty: 90 | Fill #0

## 2019-04-21 MED FILL — DIVALPROEX SOD ER 500 MG TA: 500 | 90 days supply | Qty: 90 | Fill #2

## 2019-05-02 MED FILL — PROPRANOLOL HCL ER 60 MG CP: 60 | 30 days supply | Qty: 30 | Fill #5

## 2019-05-07 ENCOUNTER — Other Ambulatory Visit (HOSPITAL_COMMUNITY): Payer: Self-pay | Admitting: Obstetrics and Gynecology

## 2019-05-07 DIAGNOSIS — N762 Acute vulvitis: Secondary | ICD-10-CM | POA: Diagnosis not present

## 2019-05-07 MED FILL — CLOBETASOL PROPIONATE 0.05: 0.05 | 10 days supply | Qty: 30 | Fill #0

## 2019-05-07 MED FILL — LIDOCAINE 5 % OINT: 5 | 11 days supply | Qty: 35 | Fill #0

## 2019-05-07 MED FILL — ESTRADIOL 10 MCG TABS: 10 | 90 days supply | Qty: 34 | Fill #0

## 2019-05-13 ENCOUNTER — Other Ambulatory Visit: Payer: Self-pay | Admitting: Neurology

## 2019-05-13 MED FILL — ZOLPIDEM TARTRATE 10 MG TAB: 10 | 30 days supply | Qty: 30 | Fill #0

## 2019-05-14 ENCOUNTER — Other Ambulatory Visit: Payer: Self-pay

## 2019-05-14 MED ORDER — ZOLPIDEM TARTRATE 10 MG PO TABS: 10.0000 mg | ORAL_TABLET | Freq: Every day | ORAL | 0 refills | Status: DC

## 2019-05-19 MED FILL — PRIMIDONE 50 MG TABLET: 50 | 30 days supply | Qty: 150 | Fill #0

## 2019-05-23 ENCOUNTER — Other Ambulatory Visit: Payer: Self-pay

## 2019-05-23 ENCOUNTER — Ambulatory Visit (INDEPENDENT_AMBULATORY_CARE_PROVIDER_SITE_OTHER): Payer: 59 | Admitting: Neurology

## 2019-05-23 ENCOUNTER — Encounter: Payer: Self-pay | Admitting: Neurology

## 2019-05-23 VITALS — BP 131/76 | HR 56 | Ht 62.0 in | Wt 164.0 lb

## 2019-05-23 DIAGNOSIS — G4700 Insomnia, unspecified: Secondary | ICD-10-CM | POA: Diagnosis not present

## 2019-05-23 DIAGNOSIS — G40309 Generalized idiopathic epilepsy and epileptic syndromes, not intractable, without status epilepticus: Secondary | ICD-10-CM

## 2019-05-23 DIAGNOSIS — F329 Major depressive disorder, single episode, unspecified: Secondary | ICD-10-CM

## 2019-05-23 DIAGNOSIS — R1310 Dysphagia, unspecified: Secondary | ICD-10-CM

## 2019-05-23 DIAGNOSIS — F32A Depression, unspecified: Secondary | ICD-10-CM

## 2019-05-23 MED ORDER — ZOLPIDEM TARTRATE 10 MG PO TABS: 10.0000 mg | ORAL_TABLET | Freq: Every day | ORAL | 5 refills | Status: DC

## 2019-05-23 MED ORDER — LAMOTRIGINE 100 MG PO TABS: 100.0000 mg | ORAL_TABLET | Freq: Two times a day (BID) | ORAL | 3 refills | Status: DC

## 2019-05-23 MED ORDER — CLONAZEPAM 0.5 MG PO TABS: ORAL_TABLET | ORAL | 5 refills | Status: DC

## 2019-05-23 MED ORDER — LEVETIRACETAM ER 500 MG PO TB24: 2500.0000 mg | ORAL_TABLET | Freq: Every day | ORAL | 3 refills | Status: DC

## 2019-05-23 MED ORDER — DIVALPROEX SODIUM ER 500 MG PO TB24: 500.0000 mg | ORAL_TABLET | Freq: Every day | ORAL | 3 refills | Status: DC

## 2019-05-23 MED ORDER — SERTRALINE HCL 50 MG PO TABS: ORAL_TABLET | ORAL | 3 refills | Status: DC

## 2019-05-23 MED FILL — LEVETIRACETAM ER 500 MG TAB: 500 | 90 days supply | Qty: 450 | Fill #0

## 2019-05-23 MED FILL — clonazePAM 0.5 MG TABS: 0.5 | 30 days supply | Qty: 10 | Fill #0

## 2019-05-23 NOTE — Patient Instructions (Signed)
1. Continue all your seizure medications  2. Sometimes less is more with melatonin, try sublingual melatonin 3mg  at night  3. Swallow evaluation will be ordered  4. Refer to Sleep specialist for insomnia. Continue with proper sleep hygiene: no caffeine after 2pm, no screens at bedtime  5. Follow-up in 6 months, call for any changes  Seizure Precautions: 1. If medication has been prescribed for you to prevent seizures, take it exactly as directed.  Do not stop taking the medicine without talking to your doctor first, even if you have not had a seizure in a long time.   2. Avoid activities in which a seizure would cause danger to yourself or to others.  Don't operate dangerous machinery, swim alone, or climb in high or dangerous places, such as on ladders, roofs, or girders.  Do not drive unless your doctor says you may.  3. If you have any warning that you may have a seizure, lay down in a safe place where you can't hurt yourself.    4.  No driving for 6 months from last seizure, as per [facility].   Please refer to the following link on the [facility] website for more information: [email or web address]   5.  Maintain good sleep hygiene. Avoid alcohol.  6.  Contact your doctor if you have any problems that may be related to the medicine you are taking.  7.  Call 911 and bring the patient back to the ED if:        A.  The seizure lasts longer than 5 minutes.       B.  The patient doesn't awaken shortly after the seizure  C.  The patient has new problems such as difficulty seeing, speaking or moving  D.  The patient was injured during the seizure  E.  The patient has a temperature over 102 F (39C)  F.  The patient vomited and now is having trouble breathing

## 2019-05-23 NOTE — Progress Notes (Signed)
NEUROLOGY FOLLOW UP OFFICE NOTE  Alyssa Mccarthy YX:[id number] 1965-07-03  HISTORY OF PRESENT ILLNESS: I had the pleasure of seeing Alyssa Mccarthy in follow-up in the neurology clinic on 05/23/2019.  The patient was last seen 6 months ago for seizures. On her last visit, she reported an increase in overwhelming feeling of exhaustion and feeling something was not right, needing more clonazepam prn. Dose of Levetiracetam XR increased back to 2500mg  daily. She is also on Lamictal 100mg  BID and Depakote ER 500mg  qhs. She is on Primidone 100mg  in AM, 150mg  in PM for essential tremor followed at [facility], s/p DBS placement with good response. She denies any seizures once Levetiracetam was increased back to 2500mg  daily. A couple of times she took the clonazepam when she did not feel good and felt something was going on. These occurred when she did not sleep well for a couple of days and when she had missed a dose of medications. She denies any staring/unresponsive episodes, gaps in time, focal numbness/tingling/weakness, myoclonic jerks. She denies any headaches, dizziness. Sometimes she notices it is harder to get up with her right leg from a squatting position. She has noticed some difficulty swallowing in the past year, she chokes and feels like food is stuck, coughing a couple of times. This is with solids or liquids. Cutting food into smaller pieces helps. Speech is fine, she gets words backwards sometimes but she states she mostly notices speech changes when her DBS battery is low and speech would be a little more slurred. Her biggest problem is sleep. She is on high dose melatonin 20mg  and Ambien. She tried to stop the melatonin and took Benadryl, with no change. Mood is good on Zoloft 150mg  daily.   History on Initial Assessment 01/29/2017: This is a pleasant 54 yo RH woman with a history of idiopathic generalized epilepsy, essential tremor s/p VIM DBS, presenting to establish care. She had been seeing epileptologist  Dr. [name] at [facility], records were reviewed. She sees Movement Disorder specialist Dr. [name] for the essential tremor. Seizures started around age 54. She reports seizures were well-controlled for 25 years, then after she had her daughter, she was having more seizures for 3-4 years, then overall has been well-controlled the past 10 years. She has no clear aura, describes body jerking that progresses to a generalized convulsion. Flashing lights and sleep deprivation are seizure triggers. She has not had any seizures since around 2015 when Depakote was re-added to her regimen. She is currently on Lamictal 100mg  BID, Keppra XR 2500mg  daily, and Depakote ER 500mg  daily with no side effects. She had been on Depakote for many years in the past but had weight gain, it was stopped, then restarted around 4 years ago. She is also on Primidone for the tremor. She denies any olfactory/gustatory hallucinations, deja vu, rising epigastric sensation, focal numbness/tingling/weakness, myoclonic jerks. She rarely has a funny feeling that "something is not quite right" and would take a prn clonazepam as a precaution, she has not needed this in about a year.   She denies any headaches, dizziness, diplopia, dysarthria/dysphagia, neck/back pain, bowel/bladder dysfunction. Sleep is good. She had been taking Zoloft increased dose 4 years ago due to irritability, improved with higher dose. She was diagnosed with a stroke in January 2018 when she had a bad headache and anisocoria. MRI brain showed subtle T2 hyperintensity in the pons, R>L. She saw Dr. 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 She has chronic mild right-sided sensory changes since then.  She has also had symptoms every once in a while where there is something she can't see (not clearly on peripheral vision), she blinks and vision is back to normal. She has chronic numbness on the left elbow region. She has noticed she has no interest in eating, but makes herself eat. She  works as a Armed forces operational officer at [employer]. She is driving.   Diagnostic Data: MRI brain in March 2011 reported as normal. EEG in March 2010 abnormal due to generalized polyspike and wave, consistent with a primary generalized epilepsy Prior AEDs: Phenobarbital, Zonisamide, Dilantin. On a different generic Levetiracetam, she had dry hands and peeling in fingertips, which resolved after switching to a different generic  Epilepsy Risk Factors:  There is a strong family history of seizures in her paternal grandfather, father, and daughter. Otherwise she had a normal birth and early development.  There is no history of febrile convulsions, CNS infections such as meningitis/encephalitis, significant traumatic brain injury, neurosurgical procedures.   PAST MEDICAL HISTORY: Past Medical History:  Diagnosis Date  . Complication of anesthesia   . Essential tremor   . Hyperlipidemia   . Hypothyroidism   . PONV (postoperative nausea and vomiting)   . Seizure disorder ([place])   . Stroke [facility])     MEDICATIONS: Current Outpatient Medications on File Prior to Visit  Medication Sig Dispense Refill  . aspirin EC 325 MG EC tablet Take 1 tablet (325 mg total) by mouth daily. 30 tablet 1  . atorvastatin (LIPITOR) 40 MG tablet Take 1 tablet (40 mg total) by mouth every evening. 30 tablet 1  . clonazePAM (KLONOPIN) 0.5 MG tablet Take 1 tablet daily, as needed for seizure. Do not use more than 2-3 a week. 10 tablet 5  . divalproex (DEPAKOTE ER) 500 MG 24 hr tablet Take 1 tablet (500 mg total) by mouth at bedtime. 90 tablet 3  . ezetimibe (ZETIA) 10 MG tablet Take 10 mg by mouth daily.     Marland Kitchen lamoTRIgine (LAMICTAL) 100 MG tablet Take 1 tablet (100 mg total) by mouth 2 (two) times daily. 180 tablet 3  . levETIRAcetam (KEPPRA XR) 500 MG 24 hr tablet Take 5 tablets (2,500 mg total) by mouth daily. Take 5 tablets daily 450 tablet 3  . levothyroxine (SYNTHROID, LEVOTHROID) 88 MCG tablet Take 88 mcg by mouth  daily before breakfast.    . Melatonin 5 MG TABS Take 15 mg by mouth as needed (sleep).     . metoprolol succinate (TOPROL-XL) 25 MG 24 hr tablet Take 25 mg by mouth daily.    . Omega-3 Fatty Acids (OMEGA 3 PO) Take 1 capsule by mouth daily.    . primidone (MYSOLINE) 50 MG tablet Take 100-150 mg by mouth See admin instructions. Take 100 mg by mouth in the morning and take 150 mg by mouth in the evening    . propranolol ER (INDERAL LA) 60 MG 24 hr capsule Take 60 mg by mouth daily.     . sertraline (ZOLOFT) 50 MG tablet Take 3 tablets daily 270 tablet 3  . Vitamin D, Ergocalciferol, (DRISDOL) 1.25 MG (50000 UT) CAPS capsule     . zolpidem (AMBIEN) 10 MG tablet Take 1 tablet (10 mg total) by mouth at bedtime. 30 tablet 0   No current facility-administered medications on file prior to visit.    ALLERGIES: Allergies  Allergen Reactions  . Gluten Meal Diarrhea and Other (See Comments)    Severe diarrhea  . Other Other (See Comments)  Bleach-respiratory distress  . Plavix [Clopidogrel Bisulfate] Itching, Rash and Other (See Comments)    Hands and feet   . Protonix [Pantoprazole] Itching and Other (See Comments)    Whelps  . Penicillins Itching, Rash and Other (See Comments)    Childhood allergy- specifics unknown; tolerated amoxicillin and ancef since then     FAMILY HISTORY: Family History  Problem Relation Age of Onset  . Heart attack Father 78  . Stroke Neg Hx   . Breast cancer Neg Hx     SOCIAL HISTORY: Social History   Socioeconomic History  . Marital status: Married    Spouse name: Not on file  . Number of children: Not on file  . Years of education: Not on file  . Highest education level: Not on file  Occupational History  . Not on file  Tobacco Use  . Smoking status: Never Smoker  . Smokeless tobacco: Never Used  Substance and Sexual Activity  . Alcohol use: Yes    Comment: socially  . Drug use: No  . Sexual activity: Not on file  Other Topics Concern  .  Not on file  Social History Narrative   Lives in 1 story home with her husband and 2 sons   Has 2 adult sons   Has bach. Degree   Works as Designer, jewellery   Social Determinants of Radio broadcast assistant Strain:   . Difficulty of Paying Living Expenses:   Food Insecurity:   . Worried About Charity fundraiser in the Last Year:   . Arboriculturist in the Last Year:   Transportation Needs:   . Film/video editor (Medical):   Marland Kitchen Lack of Transportation (Non-Medical):   Physical Activity:   . Days of Exercise per Week:   . Minutes of Exercise per Session:   Stress:   . Feeling of Stress :   Social Connections:   . Frequency of Communication with Friends and Family:   . Frequency of Social Gatherings with Friends and Family:   . Attends Religious Services:   . Active Member of Clubs or Organizations:   . Attends Archivist Meetings:   Marland Kitchen Marital Status:   Intimate Partner Violence:   . Fear of Current or Ex-Partner:   . Emotionally Abused:   Marland Kitchen Physically Abused:   . Sexually Abused:     REVIEW OF SYSTEMS: Constitutional: No fevers, chills, or sweats, no generalized fatigue, change in appetite Eyes: No visual changes, double vision, eye pain Ear, nose and throat: No hearing loss, ear pain, nasal congestion, sore throat Cardiovascular: No chest pain, palpitations Respiratory:  No shortness of breath at rest or with exertion, wheezes GastrointestinaI: No nausea, vomiting, diarrhea, abdominal pain, fecal incontinence Genitourinary:  No dysuria, urinary retention or frequency Musculoskeletal:  No neck pain, back pain Integumentary: No rash, pruritus, skin lesions Neurological: as above Psychiatric: No depression, insomnia, anxiety Endocrine: No palpitations, fatigue, diaphoresis, mood swings, change in appetite, change in weight, increased thirst Hematologic/Lymphatic:  No anemia, purpura, petechiae. Allergic/Immunologic: no itchy/runny eyes, nasal  congestion, recent allergic reactions, rashes  PHYSICAL EXAM: Vitals:   05/23/19 1603  BP: 131/76  Pulse: (!) 56  SpO2: 96%   General: No acute distress Head:  Normocephalic/atraumatic Skin/Extremities: No rash, no edema Neurological Exam: alert and oriented to person, place, and time. No aphasia or dysarthria. Fund of knowledge is appropriate.  Recent and remote memory are intact.  Attention and concentration are normal.  Cranial nerves: Pupils asymmetric (chronic) with left Horner's syndrome. Extraocular movements intact with no nystagmus. Visual fields full.No facial asymmetry. Motor: Bulk and tone normal, muscle strength 5/5 throughout with no pronator drift.  Finger to nose testing intact.  Gait narrow-based and steady, able to tandem walk adequately.  Minimal tremor today.   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 She is doing well from a seizure standpoint, continue Keppra XR 2500mg  daily, Lamotrigine 100mg  BID and Depakote ER 500mg  qhs. She has prn clonazepam for breakthrough seizures. She is reporting more swallowing difficulties, swallow evaluation will be ordered. She continues to have sleep difficulties and is now agreeable to seeing a sleep specialist. She is on Ambien 10mg  daily and was advised to reduce melatonin dose to 3mg  qhs. Sleep hygiene discussed. Continue Zoloft for mood. She is aware of [place] driving laws to stop driving after a seizure until 6 months seizure-free. Follow-up in 6 months, she knows to call for any changes.   Thank you for allowing me to participate in her care.  Please do not hesitate to call for any questions or concerns.   Alyssa Mccarthy, M.D.   CC: Dr. Forde Dandy

## 2019-05-26 ENCOUNTER — Other Ambulatory Visit: Payer: Self-pay

## 2019-05-26 DIAGNOSIS — G4700 Insomnia, unspecified: Secondary | ICD-10-CM

## 2019-05-26 NOTE — Progress Notes (Signed)
Order placed to see a sleep specialist

## 2019-06-01 MED FILL — PROPRANOLOL HCL ER 60 MG CP: 60 | 30 days supply | Qty: 30 | Fill #6

## 2019-06-05 ENCOUNTER — Telehealth: Payer: Self-pay | Admitting: Neurology

## 2019-06-05 NOTE — Telephone Encounter (Signed)
Can you pls check on how she is doing, did she take the clonazepam and feel better after? Anything else going on, less sleep than usual, infection/fever? Thanks

## 2019-06-05 NOTE — Telephone Encounter (Signed)
Line busy at 358

## 2019-06-05 NOTE — Telephone Encounter (Signed)
Patient called to report new symptoms within the last 24 hours. She further explained, "Last night at 9:00 PM I began experiencing double-vision. My daughter told me this morning that I also stumbled in the bathroom and my speech was slower. I don't remember any of that. This morning I woke up with nausea and a headache which usually happens after a seizure. I've been compliant with my meds and I did not have anything to drink as far as alcohol last night. Today at work, I've had a hard time remembering things."

## 2019-06-09 NOTE — Telephone Encounter (Signed)
Feeling better FYI, will follow up as scheduled.

## 2019-06-10 MED FILL — ZOLPIDEM TARTRATE 10 MG TAB: 10 | 30 days supply | Qty: 30 | Fill #0

## 2019-06-11 ENCOUNTER — Other Ambulatory Visit: Payer: Self-pay

## 2019-06-11 ENCOUNTER — Ambulatory Visit: Payer: 59 | Admitting: Neurology

## 2019-06-11 ENCOUNTER — Encounter: Payer: Self-pay | Admitting: Neurology

## 2019-06-11 VITALS — BP 143/71 | HR 55 | Ht 62.0 in | Wt 163.3 lb

## 2019-06-11 DIAGNOSIS — R0683 Snoring: Secondary | ICD-10-CM | POA: Diagnosis not present

## 2019-06-11 DIAGNOSIS — G40309 Generalized idiopathic epilepsy and epileptic syndromes, not intractable, without status epilepticus: Secondary | ICD-10-CM | POA: Diagnosis not present

## 2019-06-11 DIAGNOSIS — Z820 Family history of epilepsy and other diseases of the nervous system: Secondary | ICD-10-CM | POA: Diagnosis not present

## 2019-06-11 DIAGNOSIS — G479 Sleep disorder, unspecified: Secondary | ICD-10-CM

## 2019-06-11 DIAGNOSIS — Z8673 Personal history of transient ischemic attack (TIA), and cerebral infarction without residual deficits: Secondary | ICD-10-CM

## 2019-06-11 DIAGNOSIS — E663 Overweight: Secondary | ICD-10-CM | POA: Diagnosis not present

## 2019-06-11 DIAGNOSIS — G4700 Insomnia, unspecified: Secondary | ICD-10-CM

## 2019-06-11 NOTE — Patient Instructions (Addendum)
   Based on your symptoms and your exam I believe you are at risk for obstructive sleep apnea (aka OSA), and I think we should proceed with a sleep study to determine whether you do or do not have OSA and how severe it is. Even, if you have mild OSA, I may want you to consider treatment with CPAP, as treatment of even borderline or mild sleep apnea can result and improvement of symptoms such as sleep disruption, daytime sleepiness, nighttime bathroom breaks, restless leg symptoms, improvement of headache syndromes, even improved mood disorder.   Please remember, the long-term risks and ramifications of untreated moderate to severe obstructive sleep apnea are: increased Cardiovascular disease, including congestive heart failure, stroke, difficult to control hypertension, treatment resistant obesity, arrhythmias, especially irregular heartbeat commonly known as A. Fib. (atrial fibrillation); even type 2 diabetes has been linked to untreated OSA.   Sleep apnea can cause disruption of sleep and sleep deprivation in most cases, which, in turn, can cause recurrent headaches, problems with memory, mood, concentration, focus, and vigilance. Most people with untreated sleep apnea report excessive daytime sleepiness, which can affect their ability to drive. Please do not drive if you feel sleepy. Patients with sleep apnea developed difficulty initiating and maintaining sleep (aka insomnia).   Having sleep apnea may increase your risk for other sleep disorders, including involuntary behaviors sleep such as sleep terrors, sleep talking, sleepwalking.    Having sleep apnea can also increase your risk for restless leg syndrome and leg movements at night.   Please note that untreated obstructive sleep apnea may carry additional perioperative morbidity. Patients with significant obstructive sleep apnea (typically, in the moderate to severe degree) should receive, if possible, perioperative PAP (positive airway pressure)  therapy and the surgeons and particularly the anesthesiologists should be informed of the diagnosis and the severity of the sleep disordered breathing.   I will likely see you back after your sleep study to go over the test results and where to go from there. We will call you after your sleep study to advise about the results (most likely, you will hear from Kristen, my nurse) and to set up an appointment at the time, as necessary.    Our sleep lab administrative assistant will call you to schedule your sleep study and give you further instructions, regarding the check in process for the sleep study, arrival time, what to bring, when you can expect to leave after the study, etc., and to answer any other logistical questions you may have. If you don't hear back from her by about 2 weeks from now, please feel free to call her direct line at [phone] or you can call our general clinic number, or email us through My Chart.   

## 2019-06-11 NOTE — Progress Notes (Signed)
Subjective:    Patient ID: KENNEDE PROSEN is a 54 y.o. female.  HPI     Star Age, MD, PhD [facility] [address] [place], [place] [address]  Dear Dr. Delice Lesch,   I saw your patient, Kammi Segal, upon your kind request to my sleep clinic today for initial consultation of her sleep disorder, in particular, difficulty with initiating and maintaining sleep for years. The patient is unaccompanied today.  As you know, Ms. Ridgley is a 54 year old right-handed woman with an underlying medical history of hypothyroidism, essential tremor, s/p b/l DBS, seizure disorder, stroke, and overweight state, who reports snoring and difficulty initiating and maintaining sleep for years.  She has been on Ambien for several years, currently on 10 mg each night.  She has also tried melatonin, currently 5 mg but has taken up to 20 mg.  She reports a family history of OSA, her older brother had sleep apnea and had a CPAP machine. Her Epworth sleepiness score is 2 out of 24, fatigue severity score is 15 out of 63.  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  She has a daughter who is in college and an older son.  They have no pets in the household.  She drinks caffeine in the form of tea and coffee about 3-1/2 servings per day on average, avoids caffeine after 3 PM.  She has no TV in the bedroom and has worked on improving her sleep hygiene consistently.  She drinks alcohol about once or twice per week, she is a non-smoker.  She denies recurrent morning headaches or night to night nocturia.  Her Past Medical History Is Significant For: Past Medical History:  Diagnosis Date  . Complication of anesthesia   . Essential tremor   . Hyperlipidemia   . Hypothyroidism   . PONV (postoperative nausea and vomiting)   . Seizure disorder ([place])   . Stroke [facility])     Her Past Surgical History Is  Significant For: Past Surgical History:  Procedure Laterality Date  . ABDOMINAL HYSTERECTOMY    . APPENDECTOMY    . brain stimulator implant  02/2018   replacement   . BRAIN SURGERY  2015   deep brain stimulator implants bilaterally  . CARPAL TUNNEL RELEASE    . PARTIAL PROCTECTOMY BY TEM N/A 12/04/2017   Procedure: PARTIAL PROCTECTOMY REMOVAL OF RECTAL MASS BY TEM ERAS PATHWAY;  Surgeon: Michael Boston, MD;  Location: [facility];  Service: General;  Laterality: N/A;  . TOTAL KNEE ARTHROPLASTY      Her Family History Is Significant For: Family History  Problem Relation Age of Onset  . Heart attack Father 88  . Stroke Neg Hx   . Breast cancer Neg Hx     Her Social History Is Significant For: Social History   Socioeconomic History  . Marital status: Married    Spouse name: Not on file  . Number of children: Not on file  . Years of education: Not on file  . Highest education level: Not on file  Occupational History  . Not on file  Tobacco Use  . Smoking status: Never Smoker  . Smokeless tobacco: Never Used  Substance and Sexual Activity  . Alcohol use: Yes    Comment: socially  . Drug use: No  . Sexual activity: Not on file  Other Topics Concern  .  Not on file  Social History Narrative   Lives in 1 story home with her husband and 2 sons   Has 2 adult sons   Has bach. Degree   Works as Designer, jewellery   Social Determinants of Radio broadcast assistant Strain:   . Difficulty of Paying Living Expenses:   Food Insecurity:   . Worried About Charity fundraiser in the Last Year:   . Arboriculturist in the Last Year:   Transportation Needs:   . Film/video editor (Medical):   Marland Kitchen Lack of Transportation (Non-Medical):   Physical Activity:   . Days of Exercise per Week:   . Minutes of Exercise per Session:   Stress:   . Feeling of Stress :   Social Connections:   . Frequency of Communication with Friends and Family:   . Frequency of Social Gatherings with  Friends and Family:   . Attends Religious Services:   . Active Member of Clubs or Organizations:   . Attends Archivist Meetings:   Marland Kitchen Marital Status:     Her Allergies Are:  Allergies  Allergen Reactions  . Gluten Meal Diarrhea and Other (See Comments)    Severe diarrhea  . Other Other (See Comments)    Bleach-respiratory distress  . Plavix [Clopidogrel Bisulfate] Itching, Rash and Other (See Comments)    Hands and feet   . Protonix [Pantoprazole] Itching and Other (See Comments)    Whelps  . Penicillins Itching, Rash and Other (See Comments)    Childhood allergy- specifics unknown; tolerated amoxicillin and ancef since then   :   Her Current Medications Are:  Outpatient Encounter Medications as of 06/11/2019  Medication Sig  . aspirin EC 325 MG EC tablet Take 1 tablet (325 mg total) by mouth daily.  Marland Kitchen atorvastatin (LIPITOR) 40 MG tablet Take 1 tablet (40 mg total) by mouth every evening.  . clobetasol ointment (TEMOVATE) 0.05 %   . clonazePAM (KLONOPIN) 0.5 MG tablet Take 1 tablet daily, as needed for seizure. Do not use more than 2-3 a week.  . divalproex (DEPAKOTE ER) 500 MG 24 hr tablet Take 1 tablet (500 mg total) by mouth at bedtime.  . Estradiol 10 MCG TABS vaginal tablet Vaginal every 2 weeks  . ezetimibe (ZETIA) 10 MG tablet Take 10 mg by mouth daily.   Marland Kitchen lamoTRIgine (LAMICTAL) 100 MG tablet Take 1 tablet (100 mg total) by mouth 2 (two) times daily.  Marland Kitchen levETIRAcetam (KEPPRA XR) 500 MG 24 hr tablet Take 5 tablets (2,500 mg total) by mouth daily. Take 5 tablets daily  . levothyroxine (SYNTHROID, LEVOTHROID) 88 MCG tablet Take 88 mcg by mouth daily before breakfast.  . Melatonin 5 MG TABS Take by mouth as needed (sleep). 10-20 mg for sleep  . metoprolol succinate (TOPROL-XL) 25 MG 24 hr tablet Take 25 mg by mouth daily.  . primidone (MYSOLINE) 50 MG tablet Take 100-150 mg by mouth See admin instructions. Take 100 mg by mouth in the morning and take 150 mg by mouth  in the evening  . propranolol ER (INDERAL LA) 60 MG 24 hr capsule Take 60 mg by mouth daily.   . sertraline (ZOLOFT) 50 MG tablet Take 3 tablets daily  . Vitamin D, Ergocalciferol, (DRISDOL) 1.25 MG (50000 UT) CAPS capsule   . zolpidem (AMBIEN) 10 MG tablet Take 1 tablet (10 mg total) by mouth at bedtime.   No facility-administered encounter medications on file as of 06/11/2019.  :  Review of Systems:  Out of a complete 14 point review of systems, all are reviewed and negative with the exception of these symptoms as listed below: Review of Systems  Neurological:       Here for sleep consult. No prior sleep study. Reports snoring is present. She also reports trouble falling/staying asleep at night.  Epworth Sleepiness Scale 0= would never doze 1= slight chance of dozing 2= moderate chance of dozing 3= high chance of dozing  Sitting and reading:0 Watching TV:1 Sitting inactive in a public place (ex. Theater or meeting):0 As a passenger in a car for an hour without a break:0 Lying down to rest in the afternoon:1 Sitting and talking to someone:0 Sitting quietly after lunch (no alcohol):0 In a car, while stopped in traffic:0 Total:2     Objective:  Neurological Exam  Physical Exam Physical Examination:   Vitals:   06/11/19 1601  BP: (!) 143/71  Pulse: (!) 55    General Examination: The patient is a very pleasant 54 y.o. female in no acute distress. She appears well-developed and well-nourished and well groomed.   HEENT: Normocephalic, atraumatic, pupils are equal, round and reactive to light, extraocular tracking is good without limitation to gaze excursion or nystagmus noted. Hearing is grossly intact. Face is symmetric with normal facial animation. Speech is clear with no dysarthria noted. There is no hypophonia.  There is a side to side head tremor and mild voice tremor.  Neck is supple with full range of passive and active motion. There are no carotid bruits on  auscultation. Oropharynx exam reveals: mild mouth dryness, good dental hygiene and mild airway crowding, due to small airway entry and redundant soft palate, slightly larger/longer uvula, tonsils small.  Mallampati class II.  Neck circumference 14 and three-quarter inches.  She has a mild to moderate overbite.  Tongue protrudes centrally and palate elevates symmetrically.  Chest: Clear to auscultation without wheezing, rhonchi or crackles noted.  Heart: S1+S2+0, regular and normal without murmurs, rubs or gallops noted.   Abdomen: Soft, non-tender and non-distended with normal bowel sounds appreciated on auscultation.  Extremities: There is no pitting edema in the distal lower extremities bilaterally.   Skin: Warm and dry without trophic changes noted.   Musculoskeletal: exam reveals no obvious joint deformities, tenderness or joint swelling or erythema.   Neurologically:  Mental status: The patient is awake, alert and oriented in all 4 spheres. Her immediate and remote memory, attention, language skills and fund of knowledge are appropriate. There is no evidence of aphasia, agnosia, apraxia or anomia. Speech is clear with normal prosody and enunciation. Thought process is linear. Mood is normal and affect is normal.  Cranial nerves II - XII are as described above under HEENT exam.  Motor exam: Normal bulk, strength and tone is noted. There is no resting tremor but she does have a bilateral upper extremity action tremor.  Romberg shows mild sway. Fine motor skills and coordination: grossly intact.  Cerebellar testing: No dysmetria or intention tremor. There is no truncal or gait ataxia.  Sensory exam: intact to light touch in the upper and lower extremities.  Gait, station and balance: She stands easily. No veering to one side is noted. No leaning to one side is noted. Posture is age-appropriate and stance is narrow based. Gait shows normal stride length and normal pace. No problems turning are  noted.   Assessment and Plan:  In summary, BRENNLEY MOREIRA is a very pleasant 53 y.o.-year old female with  an underlying medical history of hypothyroidism, essential tremor, s/p b/l DBS, seizure disorder, stroke, and overweight state, who presents for evaluation of her sleep disorder.  She does not have a telltale history concerning for underlying sleep disordered breathing but given her history of sleep difficulties, and snoring as well as family history of sleep apnea, it would be worthwhile to look at her sleep for evaluation for underlying obstructive sleep apnea especially in light of her stroke history.   I had a long chat with the patient about my findings and the diagnosis of OSA, its prognosis and treatment options. We talked about medical treatments, surgical interventions and non-pharmacological approaches. I explained in particular the risks and ramifications of untreated moderate to severe OSA, especially with respect to developing cardiovascular disease down the Road, including congestive heart failure, difficult to treat hypertension, cardiac arrhythmias, or stroke. Even type 2 diabetes has, in part, been linked to untreated OSA. Symptoms of untreated OSA include daytime sleepiness, memory problems, mood irritability and mood disorder such as depression and anxiety, lack of energy, as well as recurrent headaches, especially morning headaches. We talked about trying to maintain a healthy lifestyle in general, as well as the importance of weight control. We also talked about the importance of good sleep hygiene. I recommended the following at this time: sleep study.  I explained the sleep test procedure to the patient and also outlined possible surgical and non-surgical treatment options of OSA, including the use of a custom-made dental device (which would require a referral to a specialist dentist or oral surgeon), upper airway surgical options (which would involve a referral to an ENT surgeon). I  also explained the CPAP treatment option to the patient, who indicated that she would be willing to try CPAP if the need arises. I explained the importance of being compliant with PAP treatment, not only for insurance purposes but primarily to improve Her symptoms, and for the patient's long term health benefit, including to reduce Her cardiovascular risks. I answered all her questions today and the patient was in agreement. I plan to see her back after the sleep study is completed and encouraged her to call with any interim questions, concerns, problems or updates.   Thank you very much for allowing me to participate in the care of this nice patient. If I can be of any further assistance to you please do not hesitate to call me at [phone].  Sincerely,   Star Age, MD, PhD

## 2019-06-12 MED FILL — PRIMIDONE 50 MG TABS: 50 | 30 days supply | Qty: 150 | Fill #1

## 2019-06-14 MED FILL — SERTRALINE HCL 50 MG TABS: 50 | 90 days supply | Qty: 270 | Fill #1

## 2019-06-17 DIAGNOSIS — G250 Essential tremor: Secondary | ICD-10-CM | POA: Diagnosis not present

## 2019-06-17 DIAGNOSIS — Z9689 Presence of other specified functional implants: Secondary | ICD-10-CM | POA: Diagnosis not present

## 2019-06-17 DIAGNOSIS — Z9889 Other specified postprocedural states: Secondary | ICD-10-CM | POA: Diagnosis not present

## 2019-06-17 DIAGNOSIS — Z880 Allergy status to penicillin: Secondary | ICD-10-CM | POA: Diagnosis not present

## 2019-06-17 DIAGNOSIS — Z888 Allergy status to other drugs, medicaments and biological substances status: Secondary | ICD-10-CM | POA: Diagnosis not present

## 2019-06-19 MED FILL — AMLODIPINE BESYLATE 5 MG TA: 5 | 90 days supply | Qty: 90 | Fill #2

## 2019-06-19 MED FILL — VIT D2 1.25 MG (50,000 UNIT: 1.25 MG | 84 days supply | Qty: 24 | Fill #0

## 2019-06-20 MED FILL — clonazePAM 0.5 MG TABS: 0.5 | 30 days supply | Qty: 10 | Fill #1

## 2019-06-30 DIAGNOSIS — Z9689 Presence of other specified functional implants: Secondary | ICD-10-CM | POA: Diagnosis not present

## 2019-06-30 DIAGNOSIS — G250 Essential tremor: Secondary | ICD-10-CM | POA: Diagnosis not present

## 2019-07-02 DIAGNOSIS — Z01419 Encounter for gynecological examination (general) (routine) without abnormal findings: Secondary | ICD-10-CM | POA: Diagnosis not present

## 2019-07-02 DIAGNOSIS — Z6830 Body mass index (BMI) 30.0-30.9, adult: Secondary | ICD-10-CM | POA: Diagnosis not present

## 2019-07-08 ENCOUNTER — Other Ambulatory Visit: Payer: Self-pay

## 2019-07-08 MED ORDER — ZOLPIDEM TARTRATE 10 MG PO TABS: 10.0000 mg | ORAL_TABLET | Freq: Every day | ORAL | 5 refills | Status: DC

## 2019-07-08 MED FILL — ZOLPIDEM TARTRATE 10 MG TAB: 10 | 30 days supply | Qty: 30 | Fill #0

## 2019-07-12 MED FILL — SYNTHROID 88 MCG TABLET: 88 | 90 days supply | Qty: 90 | Fill #1

## 2019-07-12 MED FILL — LAMOTRIGINE 100 MG TABS: 100 | 90 days supply | Qty: 180 | Fill #1

## 2019-07-12 MED FILL — PRIMIDONE 50 MG TABS: 50 | 30 days supply | Qty: 150 | Fill #2

## 2019-07-14 MED FILL — ATORVASTATIN CALCIUM 40 MG: 40 | 90 days supply | Qty: 90 | Fill #1

## 2019-07-22 MED FILL — DIVALPROEX SOD ER 500 MG TA: 500 | 90 days supply | Qty: 90 | Fill #3

## 2019-07-22 MED FILL — clonazePAM 0.5 MG TABS: 0.5 | 30 days supply | Qty: 10 | Fill #2

## 2019-07-31 MED FILL — PROPRANOLOL HCL ER 60 MG CP: 60 | 30 days supply | Qty: 30 | Fill #8

## 2019-08-05 MED FILL — ZOLPIDEM TARTRATE 10 MG TAB: 10 | 30 days supply | Qty: 30 | Fill #1

## 2019-08-08 DIAGNOSIS — T85113A Breakdown (mechanical) of implanted electronic neurostimulator, generator, initial encounter: Secondary | ICD-10-CM | POA: Diagnosis not present

## 2019-08-08 DIAGNOSIS — Z8349 Family history of other endocrine, nutritional and metabolic diseases: Secondary | ICD-10-CM | POA: Diagnosis not present

## 2019-08-08 DIAGNOSIS — G902 Horner's syndrome: Secondary | ICD-10-CM | POA: Diagnosis not present

## 2019-08-08 DIAGNOSIS — T85193A Other mechanical complication of implanted electronic neurostimulator, generator, initial encounter: Secondary | ICD-10-CM | POA: Diagnosis not present

## 2019-08-08 DIAGNOSIS — E785 Hyperlipidemia, unspecified: Secondary | ICD-10-CM | POA: Diagnosis not present

## 2019-08-08 DIAGNOSIS — Z79890 Hormone replacement therapy (postmenopausal): Secondary | ICD-10-CM | POA: Diagnosis not present

## 2019-08-08 DIAGNOSIS — Z809 Family history of malignant neoplasm, unspecified: Secondary | ICD-10-CM | POA: Diagnosis not present

## 2019-08-08 DIAGNOSIS — Z8249 Family history of ischemic heart disease and other diseases of the circulatory system: Secondary | ICD-10-CM | POA: Diagnosis not present

## 2019-08-08 DIAGNOSIS — G250 Essential tremor: Secondary | ICD-10-CM | POA: Diagnosis not present

## 2019-08-08 DIAGNOSIS — Z8673 Personal history of transient ischemic attack (TIA), and cerebral infarction without residual deficits: Secondary | ICD-10-CM | POA: Diagnosis not present

## 2019-08-08 MED FILL — HYDROCODON-APAP 5-325: 5-325 | 3 days supply | Qty: 12 | Fill #0

## 2019-08-11 ENCOUNTER — Other Ambulatory Visit: Payer: Self-pay | Admitting: *Deleted

## 2019-08-11 MED FILL — PRIMIDONE 50 MG TABS: 50 | 30 days supply | Qty: 150 | Fill #3

## 2019-08-11 NOTE — Patient Outreach (Signed)
[place] [facility]) Care Management  08/11/2019  SEMIYAH NEWGENT 03/25/1965 [id number]   Transition of care telephone call  Referral received: 08/07/19 Initial outreach:08/11/19 Insurance: UMR   Initial unsuccessful telephone call to patient's preferred number in order to complete transition of care assessment; no answer, left HIPAA compliant voicemail message requesting return call.   Objective: Per the electronic medical record, Mrs.Alyssa Mccarthy   was hospitalized at  [facility] on 08/08/19  With/for Deep Brain stimulator phase 3 generator replacement  . Comorbidities include: Hyperlipidemia, Seizure disorder,CVA essential tremor, Hypothyroidism,  She  was discharged to home on 08/08/19 without the need for home health services or durable medical equipment per the discharge summary.   Plan: This RNCM will route unsuccessful outreach letter with [place] Management pamphlet and 24 hour Nurse Advice Line Magnet to [place] Management clinical pool to be mailed to patient's home address. This RNCM will attempt another outreach within 4 business days.   Joylene Draft, RN, BSN  [place] Management Coordinator  [phone]- Mobile [phone]- Toll Free Main Office

## 2019-08-14 ENCOUNTER — Other Ambulatory Visit: Payer: Self-pay | Admitting: *Deleted

## 2019-08-14 NOTE — Patient Outreach (Signed)
[place] [facility]) Care Management  08/14/2019  ERSEL WADLEIGH [birth year] [id number]   Transition of care call Referral received: 08/07/19 Initial outreach attempt: 08/11/19 Insurance: UMR   2nd unsuccessful telephone call to patient's preferred contact number in order to complete post hospital discharge transition of care assessment , no answer left HIPAA compliant message requesting return call.    Objective: Per the electronic medical record, Mrs.Angelise Zappulla   was hospitalized at  [facility] on 08/08/19  With/for Deep Brain stimulator phase 3 generator replacement  . Comorbidities include: Hyperlipidemia, Seizure disorder,CVA essential tremor, Hypothyroidism,  She  was discharged to home on 08/08/19 without the need for home health services or durable medical equipment per the discharge summary.    Plan If no return call from patient will attempt 3rd outreach in the next 4 business days.   Joylene Draft, RN, BSN  [place] Management Coordinator  [phone]- Mobile [phone]- Toll Free Main Office

## 2019-08-19 ENCOUNTER — Other Ambulatory Visit: Payer: Self-pay | Admitting: *Deleted

## 2019-08-19 MED FILL — clonazePAM 0.5 MG TABS: 0.5 | 30 days supply | Qty: 10 | Fill #3

## 2019-08-19 NOTE — Patient Outreach (Signed)
[place] [facility]) Care Management  08/19/2019  [name] Sep 02, 1965 [id number]   Transition of care call Referral received: 08/07/19 Initial outreach attempt: 08/11/19 Insurance: [place] unsuccessful telephone call to patient's preferred contact number in order to complete post hospital discharge transition of care assessment; no answer, left HIPAA compliant message requesting return call.   Objective: Per the electronic medical record, Mrs.Alyssa Brownwas hospitalized at [facility] on 7/16/21With/for Deep Brain stimulator phase 3 generator replacement. Comorbidities include: Hyperlipidemia, Seizure disorder,CVA essential tremor, Hypothyroidism, She was discharged to home on 7/16/21without the need for home health services or durable medical equipment per the discharge summary.  Plan: If no return call from patient, will close case to [place] Management services in 10 business days after initial post hospital discharge outreach, on 7/30 /21.   Joylene Draft, RN, BSN  [place] Management Coordinator  [phone]- Mobile [phone]- Toll Free Main Office

## 2019-08-21 DIAGNOSIS — Z880 Allergy status to penicillin: Secondary | ICD-10-CM | POA: Diagnosis not present

## 2019-08-21 DIAGNOSIS — E7849 Other hyperlipidemia: Secondary | ICD-10-CM | POA: Diagnosis not present

## 2019-08-21 DIAGNOSIS — E559 Vitamin D deficiency, unspecified: Secondary | ICD-10-CM | POA: Diagnosis not present

## 2019-08-21 DIAGNOSIS — Z888 Allergy status to other drugs, medicaments and biological substances status: Secondary | ICD-10-CM | POA: Diagnosis not present

## 2019-08-21 DIAGNOSIS — E038 Other specified hypothyroidism: Secondary | ICD-10-CM | POA: Diagnosis not present

## 2019-08-21 DIAGNOSIS — G250 Essential tremor: Secondary | ICD-10-CM | POA: Diagnosis not present

## 2019-08-21 DIAGNOSIS — R569 Unspecified convulsions: Secondary | ICD-10-CM | POA: Diagnosis not present

## 2019-08-21 DIAGNOSIS — Z9689 Presence of other specified functional implants: Secondary | ICD-10-CM | POA: Diagnosis not present

## 2019-08-21 DIAGNOSIS — I10 Essential (primary) hypertension: Secondary | ICD-10-CM | POA: Diagnosis not present

## 2019-08-21 DIAGNOSIS — R82998 Other abnormal findings in urine: Secondary | ICD-10-CM | POA: Diagnosis not present

## 2019-08-21 DIAGNOSIS — R109 Unspecified abdominal pain: Secondary | ICD-10-CM | POA: Diagnosis not present

## 2019-08-21 DIAGNOSIS — Z4802 Encounter for removal of sutures: Secondary | ICD-10-CM | POA: Diagnosis not present

## 2019-08-21 DIAGNOSIS — K219 Gastro-esophageal reflux disease without esophagitis: Secondary | ICD-10-CM | POA: Diagnosis not present

## 2019-08-22 ENCOUNTER — Other Ambulatory Visit: Payer: Self-pay | Admitting: *Deleted

## 2019-08-22 NOTE — Patient Outreach (Signed)
[place] [facility]) Care Management  08/22/2019  ELCIE PELSTER May 20, 1965 [id number]   Transition of care /Case Closure Unsuccessful outreach    Referral received:08/07/19 Initial outreach:08/11/19 Insurance: UMR   Unable to complete post hospital discharge transition of care assessment. No return call form patient after 3 call attempts and no response to request to contact RN Care Coordinator in unsuccessful outreach letter mailed to home on 08/11/19.  Objective: Per the electronic medical record, Mrs.Gracilyn Brownwas hospitalized at [facility] on 7/16/21With/for Deep Brain stimulator phase 3 generator replacement. Comorbidities include: Hyperlipidemia, Seizure disorder,CVA essential tremor, Hypothyroidism, She was discharged to home on 7/16/21without the need for home health services or durable medical equipment per the discharge summary. Plan Case closed to [facility] [employer] as it has been 10 days since initial post discharge outreach attempt.    Joylene Draft, RN, BSN  [place] Management Coordinator  [phone]- Mobile [phone]- Toll Free Main Office

## 2019-08-30 MED FILL — PROPRANOLOL HCL ER 60 MG CP: 60 | 30 days supply | Qty: 30 | Fill #9

## 2019-09-01 MED FILL — LEVETIRACETAM ER 500 MG TAB: 500 | 90 days supply | Qty: 450 | Fill #1

## 2019-09-03 ENCOUNTER — Other Ambulatory Visit: Payer: Self-pay | Admitting: Endocrinology

## 2019-09-03 DIAGNOSIS — R109 Unspecified abdominal pain: Secondary | ICD-10-CM

## 2019-09-04 MED FILL — ZOLPIDEM TARTRATE 10 MG TAB: 10 | 30 days supply | Qty: 30 | Fill #2

## 2019-09-05 MED FILL — VIT D2 1.25 MG (50,000 UNIT: 1.25 MG | 84 days supply | Qty: 24 | Fill #1

## 2019-09-10 MED FILL — PRIMIDONE 50 MG TABS: 50 | 30 days supply | Qty: 150 | Fill #4

## 2019-09-12 MED FILL — SERTRALINE HCL 50 MG TABLET: 50 | 90 days supply | Qty: 270 | Fill #2

## 2019-09-17 MED FILL — AMLODIPINE BESYLATE 5 MG TA: 5 | 90 days supply | Qty: 90 | Fill #3

## 2019-09-18 MED FILL — clonazePAM 0.5 MG TABS: 0.5 | 30 days supply | Qty: 10 | Fill #4

## 2019-09-22 MED FILL — EZETIMIBE 10 MG TABS: 10 | 90 days supply | Qty: 90 | Fill #1

## 2019-09-23 ENCOUNTER — Ambulatory Visit
Admission: RE | Admit: 2019-09-23 | Discharge: 2019-09-23 | Disposition: A | Discharge status: Home / Self Care | Payer: 59 | Source: Ambulatory Visit | Attending: Endocrinology | Admitting: Endocrinology

## 2019-09-23 DIAGNOSIS — R109 Unspecified abdominal pain: Secondary | ICD-10-CM

## 2019-10-01 MED FILL — PROPRANOLOL HCL ER 60 MG CP: 60 | 30 days supply | Qty: 30 | Fill #10

## 2019-10-04 MED FILL — ZOLPIDEM TARTRATE 10 MG TAB: 10 | 30 days supply | Qty: 30 | Fill #3

## 2019-10-06 ENCOUNTER — Ambulatory Visit: Payer: 59

## 2019-10-09 DIAGNOSIS — N94810 Vulvar vestibulitis: Secondary | ICD-10-CM | POA: Diagnosis not present

## 2019-10-10 ENCOUNTER — Other Ambulatory Visit (HOSPITAL_COMMUNITY): Payer: Self-pay | Admitting: Endocrinology

## 2019-10-10 MED FILL — SYNTHROID 88 MCG TABLET: 88 | 90 days supply | Qty: 90 | Fill #0

## 2019-10-10 MED FILL — PRIMIDONE 50 MG TABS: 50 | 30 days supply | Qty: 150 | Fill #5

## 2019-10-10 MED FILL — LAMOTRIGINE 100 MG TABS: 100 | 90 days supply | Qty: 180 | Fill #2

## 2019-10-12 MED FILL — ATORVASTATIN CALCIUM 40 MG: 40 | 90 days supply | Qty: 90 | Fill #2

## 2019-10-14 MED FILL — ESTRADIOL 10 MCG TABS: 10 | 90 days supply | Qty: 26 | Fill #1

## 2019-10-18 MED FILL — clonazePAM 0.5 MG TABS: 0.5 | 30 days supply | Qty: 10 | Fill #5

## 2019-10-18 MED FILL — DIVALPROEX SOD ER 500 MG TA: 500 | 90 days supply | Qty: 90 | Fill #0

## 2019-10-20 ENCOUNTER — Other Ambulatory Visit: Payer: Self-pay

## 2019-10-20 ENCOUNTER — Other Ambulatory Visit: Payer: Self-pay | Admitting: Neurology

## 2019-10-20 MED ORDER — DIVALPROEX SODIUM ER 500 MG PO TB24: 500.0000 mg | ORAL_TABLET | Freq: Every day | ORAL | 0 refills | Status: DC

## 2019-10-20 NOTE — Telephone Encounter (Signed)
Refill sent in

## 2019-10-23 ENCOUNTER — Other Ambulatory Visit: Payer: Self-pay

## 2019-10-23 ENCOUNTER — Encounter: Payer: Self-pay | Admitting: Speech Pathology

## 2019-10-23 ENCOUNTER — Ambulatory Visit: Payer: 59 | Attending: Endocrinology | Admitting: Speech Pathology

## 2019-10-23 DIAGNOSIS — Z9689 Presence of other specified functional implants: Secondary | ICD-10-CM | POA: Diagnosis not present

## 2019-10-23 DIAGNOSIS — G250 Essential tremor: Secondary | ICD-10-CM | POA: Diagnosis not present

## 2019-10-23 DIAGNOSIS — R471 Dysarthria and anarthria: Secondary | ICD-10-CM | POA: Diagnosis not present

## 2019-10-23 DIAGNOSIS — Z880 Allergy status to penicillin: Secondary | ICD-10-CM | POA: Diagnosis not present

## 2019-10-23 DIAGNOSIS — Z9682 Presence of neurostimulator: Secondary | ICD-10-CM | POA: Diagnosis not present

## 2019-10-23 DIAGNOSIS — Z888 Allergy status to other drugs, medicaments and biological substances status: Secondary | ICD-10-CM | POA: Diagnosis not present

## 2019-10-23 NOTE — Patient Instructions (Signed)
SLOP: Slow, Loud, Over-enunciate, Pause  Speech Exercises  Do 3 times in a row, 2 times a day  Call the cat Buttercup A calendar of [place], [place] Four floors to cover Yellow oil ointment Fellow lovers of felines Catastrophe in [place] plumbers plums The churchs chimes chimed Telling time 'til eleven Five valve levers Keep the gate closed Go see that guy Fat cows give milk [employer] Gophers Fat frogs flip freely Kohl's into bed Get that game to [employer] Thick thistles stick together Cinnamon aluminum linoleum Black bugs blood Lovely lemon linament Red leather, yellow leather  Big grocery buggy    Purple baby carriage [facility] Proper copper coffee pot Ripe purple cabbage Three free throws [employer] tackled  [employer] dipped the dessert  [employer]gers that [employer] of [place] Shirts shrink, [place] 49ers Take the tackle box File the flash message Give me five flapjacks Fundamental relatives Dye the pets purple Talking [place] time after time Dark chocolate chunks Political landscape of the kingdom Estate manager/land agent genius We played yo-yos yesterday  Add some "everyday" phrases, aka things you say EVERY DAY. Practice these using strategies listed above.  1. What do you want for dinner?  2. Have you heard from Judson Roch?  3. Have you found a beach house yet?  4.  5.  6.  7.  8.  9.  10.

## 2019-10-24 NOTE — Therapy (Signed)
[facility] [address] [place], [place], [address] Phone: [phone]   Fax:  [phone]  Speech Language Pathology Evaluation  Patient Details  Name: Alyssa Mccarthy MRN: [id number] Date of Birth: 29-Jul-1965 Referring Provider (SLP): [name], [place]   Encounter Date: 10/23/2019   End of Session - 10/23/19 1606    Visit Number 1    Number of Visits 9    Date for SLP Re-Evaluation 12/22/19    SLP Start Time 4403    SLP Stop Time  1613    SLP Time Calculation (min) 43 min    Activity Tolerance Patient tolerated treatment well           Past Medical History:  Diagnosis Date  . Complication of anesthesia   . Essential tremor   . Hyperlipidemia   . Hypothyroidism   . PONV (postoperative nausea and vomiting)   . Seizure disorder ([place])   . Stroke [facility])     Past Surgical History:  Procedure Laterality Date  . ABDOMINAL HYSTERECTOMY    . APPENDECTOMY    . brain stimulator implant  02/2018   replacement   . BRAIN SURGERY  2015   deep brain stimulator implants bilaterally  . CARPAL TUNNEL RELEASE    . PARTIAL PROCTECTOMY BY TEM N/A 12/04/2017   Procedure: PARTIAL PROCTECTOMY REMOVAL OF RECTAL MASS BY TEM ERAS PATHWAY;  Surgeon: Alyssa Boston, MD;  Location: [facility];  Service: General;  Laterality: N/A;  . TOTAL KNEE ARTHROPLASTY      There were no vitals filed for this visit.   Subjective Assessment - 10/23/19 1542    Subjective "I feel like I have a thick tongue, and I have trouble getting the words out."    Currently in Pain? No/denies              SLP Evaluation [facility] - 10/23/19 1542      SLP Visit Information   SLP Received On 10/23/19    Referring Provider (SLP) [name], PA-C    Onset Date referral 09/30/19   speech issues over the past year   Medical Diagnosis slurred speech, essential tremor      Subjective   Patient/Family Stated Goal get my words out better      General  Information   HPI Pt is a 54 y.o. female referred for slurred speech s/p deep brain stimulator placement. 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      Balance Screen   Has the patient fallen in the past 6 months No    Has the patient had a decrease in activity level because of a fear of falling?  No    Is the patient reluctant to leave their home because of a fear of falling?  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     Cognition   Overall Cognitive Status Within Functional Limits for tasks assessed      Auditory Comprehension   Overall Auditory Comprehension Appears within functional limits for tasks assessed   wears hearing aids     Visual Recognition/Discrimination   Discrimination Not tested  Reading Comprehension   Reading Status Within funtional limits      Expression   Primary Mode of Expression Verbal      Verbal Expression   Overall Verbal Expression Appears within functional limits for tasks assessed      Oral Motor/Sensory Function   Overall Oral Motor/Sensory Function Impaired    Labial ROM Reduced left    Labial Symmetry Abnormal symmetry left    Labial Strength Within Functional Limits    Lingual ROM Within Functional Limits    Lingual Symmetry Within Functional Limits    Lingual Strength Within Functional Limits    Lingual Coordination Reduced    Facial ROM Reduced left    Facial Symmetry Left droop    Velum Within Functional Limits    Mandible --   adequate strength; tremor noted     Motor Speech   Overall Motor Speech Impaired    Respiration Impaired    Level of Impairment Conversation    Phonation Hoarse   mild strained/strangled vocal quality w/ vowel  prolongation   Resonance Within functional limits    Articulation Impaired    Level of Impairment Sentence   imprecise lingual consonants   Intelligibility Intelligible    Motor Planning Witnin functional limits    Motor Speech Errors Aware    Effective Techniques Slow rate    Phonation --      Standardized Assessments   Standardized Assessments  Other Assessment       Oral motor assessment revealed WNL lingual ROM and WNL lingual strength. Labial ROM was mildly reduced left and strength was WNL. Velar ROM appeared WNL. Active jaw tremor noted.   Conversational speech was within normal range for intensity, average low-mid 70s dB at 30 cm. Overall speech intelligibility for this listener in a quiet environment was not affected, at approx 100%. Singular and alternating motion rates were slow and irregular. Imprecise lingual consonants are noted. Vocal quality is intermittently hoarse. MPT is 18.2 seconds; quality is mildly strained, strangled. Communication Effectiveness Survey score is 19/32 (higher score is "more effective").                     SLP Education - 10/24/19 0808    Education Details ST focus would be on compensations, strategies    Person(s) Educated Patient    Methods Explanation    Comprehension Verbalized understanding            SLP Short Term Goals - 10/24/19 4431      SLP SHORT TERM GOAL #1   Title pt will complete dysarthria HEP for speech intelligibility ("tongue twisters") with >95% use of compensations, in 2 sessions    Time 4    Period Weeks    Status New      SLP SHORT TERM GOAL #2   Title Pt will use compensations for dysarthria >85% of the time in 5-8 minutes simple conversation, x2 visits.    Time 4    Period Weeks    Status New            SLP Long Term Goals - 10/24/19 0813      SLP LONG TERM GOAL #1   Title Patient will use compensations for dysarthria in 15 minutes conversation x 2 visits.    Time 8    Period Weeks     Status New      SLP LONG TERM GOAL #2   Title Pt will report improved confidence in ability to communicate  at work using dysarthria compensations, strategies.    Time 8    Period Weeks    Status New      SLP LONG TERM GOAL #3   Title Pt will report improved communication effectiveness as measured by Communication Effectiveness Survey.    Time 8    Period Weeks    Status New            Plan - 10/24/19 9702    Clinical Impression Statement Mrs. Yogi presents with mild dysarthria, onset s/p DBS placement for essential tremor, which has increased over the past year. She describes the sensation of having "a thick tongue" and reports if she speaks quickly her speech is more discoordinated. Speech is characterized by intermittent hoarse vocal quality, imprecise lingual consonants and discoordination noted with sentences/conversation of increasing length/complexity. Vocal quality for vowel prolongation is mildly strained/strangled. Her MD mentioned to her that she may have vocal tremor; she may benefit from ENT consult to evaluate for this. 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 I recommend skilled ST to train pt in intelligibility compensations to improve communication effectiveness and confidence in her ability to communicate at work.    Speech Therapy Frequency 1x /week    Duration 8 weeks    Treatment/Interventions SLP instruction and feedback;Compensatory strategies;Patient/family education;Compensatory techniques    Potential to Achieve Goals Good    SLP Home Exercise Plan tongue twisters, dysarthria strategies, personally relevant phrases initiated, see pt instructions    Consulted and Agree with Plan of Care Patient           Patient will benefit from skilled therapeutic intervention in order to improve the following deficits and impairments:   Dysarthria and anarthria    Problem  List Patient Active Problem List   Diagnosis Date Noted  . Hypokalemia 12/05/2017  . Hypomagnesemia 12/05/2017  . Adenomatous rectal polyp s/p TEM partial proctectomy 12/04/2017 12/04/2017  . Cerebral thrombosis with cerebral infarction 02/18/2016  . Anisocoria 02/17/2016  . Epilepsy ([place]) 02/17/2016  . Essential tremor 02/17/2016  . HLD (hyperlipidemia) 02/17/2016  . Headache 02/17/2016  . Stroke-like symptoms 02/17/2016   Deneise Lever, [place], [place] 10/24/2019, 8:36 AM  [name] [address] [place], [place], [address] Phone: [phone]   Fax:  [phone]  Name: ESHAL PROPPS MRN: [id number] Date of Birth: 31-Dec-1965

## 2019-10-29 MED FILL — PROPRANOLOL HCL ER 60 MG CP: 60 | 30 days supply | Qty: 30 | Fill #11

## 2019-11-03 MED FILL — ZOLPIDEM TARTRATE 10 MG TAB: 10 | 30 days supply | Qty: 30 | Fill #4

## 2019-11-14 ENCOUNTER — Other Ambulatory Visit (HOSPITAL_COMMUNITY): Payer: Self-pay | Admitting: Neurology

## 2019-11-14 MED FILL — PRIMIDONE 50 MG TABS: 50 | 30 days supply | Qty: 150 | Fill #0

## 2019-11-17 ENCOUNTER — Other Ambulatory Visit: Payer: Self-pay | Admitting: Neurology

## 2019-11-17 MED FILL — clonazePAM 0.5 MG TABS: 0.5 | 30 days supply | Qty: 10 | Fill #0

## 2019-11-18 ENCOUNTER — Other Ambulatory Visit: Payer: Self-pay

## 2019-11-18 ENCOUNTER — Ambulatory Visit: Payer: 59 | Attending: Endocrinology

## 2019-11-18 DIAGNOSIS — R471 Dysarthria and anarthria: Secondary | ICD-10-CM | POA: Insufficient documentation

## 2019-11-18 NOTE — Therapy (Signed)
[facility] [address] [place], [place], [address] Phone: [phone]   Fax:  [phone]  Speech Language Pathology Treatment  Patient Details  Name: Alyssa Mccarthy MRN: [id number] Date of Birth: 07-21-1965 Referring Provider (SLP): [name], [place]   Encounter Date: 11/18/2019   End of Session - 11/18/19 1633    Visit Number 2    Number of Visits 9    Date for SLP Re-Evaluation 12/22/19    SLP Start Time 9407    SLP Stop Time  6808    SLP Time Calculation (min) 40 min    Activity Tolerance Patient tolerated treatment well           Past Medical History:  Diagnosis Date  . Complication of anesthesia   . Essential tremor   . Hyperlipidemia   . Hypothyroidism   . PONV (postoperative nausea and vomiting)   . Seizure disorder ([place])   . Stroke [facility])     Past Surgical History:  Procedure Laterality Date  . ABDOMINAL HYSTERECTOMY    . APPENDECTOMY    . brain stimulator implant  02/2018   replacement   . BRAIN SURGERY  2015   deep brain stimulator implants bilaterally  . CARPAL TUNNEL RELEASE    . PARTIAL PROCTECTOMY BY TEM N/A 12/04/2017   Procedure: PARTIAL PROCTECTOMY REMOVAL OF RECTAL MASS BY TEM ERAS PATHWAY;  Surgeon: Michael Boston, MD;  Location: [facility];  Service: General;  Laterality: N/A;  . TOTAL KNEE ARTHROPLASTY      There were no vitals filed for this visit.   Subjective Assessment - 11/18/19 1538    Subjective "At least daily - sometimes twice a day." re: frequency of HEP    Currently in Pain? No/denies                 ADULT SLP TREATMENT - 11/18/19 1539      General Information   Behavior/Cognition Alert;Cooperative;Pleasant mood      Treatment Provided   Treatment provided Cognitive-Linquistic      Cognitive-Linquistic Treatment   Treatment focused on Dysarthria    Skilled Treatment Pt endorses using compensations (SLOP) in her daily activities in the last 4 weeks,  when she notices a word is not intelligible. When pt does not notice she has said something unintelligible and listener makes her aware she repeats the word she tihinks she may have said in error - this is correct 80-90% of the time. SLP, to address pt's current level of frustration, asked pt to compare frustration level about her speech prior to ST (8/10, where 10=extremely frustrating) to now, after having some compensations to use (3/10, where 10=extrememly frustrating). Pt stated her "tongue twisters" which pt calls "her words" with remarkable slow articuation with 5 reps. SLP had pt repeat certain word combinations with this subset of phrases and found that /st/ and "sh" and "ch" were more challenging for pt. SLP then copied  multiple pages of /st/ words and asked pt to practice saying common words in highlighted categories (medial and final /st/, multisyllable) by saying the word and then a sentence with the word- focusing on SLOP.       Assessment / Recommendations / Plan   Plan Continue with current plan of care      Progression Toward Goals   Progression toward goals Progressing toward goals              SLP Short Term Goals - 11/18/19 1636  SLP SHORT TERM GOAL #1   Title pt will complete dysarthria HEP for speech intelligibility ("tongue twisters") with >95% use of compensations, in 2 sessions    Time 4    Period Weeks    Status On-going      SLP SHORT TERM GOAL #2   Title Pt will use compensations for dysarthria >85% of the time in 5-8 minutes simple conversation, x2 visits.    Time 4    Period Weeks    Status On-going            SLP Long Term Goals - 11/18/19 1636      SLP LONG TERM GOAL #1   Title Patient will use compensations for dysarthria in 15 minutes conversation x 2 visits.    Time 8    Period Weeks    Status On-going      SLP LONG TERM GOAL #2   Title Pt will report improved confidence in ability to communicate at work using dysarthria compensations,  strategies.    Time 8    Period Weeks    Status On-going      SLP LONG TERM GOAL #3   Title Pt will report improved communication effectiveness as measured by Communication Effectiveness Survey.    Time 8    Period Weeks    Status On-going            Plan - 11/18/19 1634    Clinical Impression Statement Alyssa Mccarthy presents with mild dysarthria, onset s/p DBS placement for essential tremor, which has increased over the past year. She continues to report if she speaks quickly her speech is more discoordinated, but she reports frustration level pre and post ST evaluation as 8, and 3 out of 10, respectively, where 10=extreme frustration. 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 I recommend skilled ST to train pt in intelligibility compensations to improve communication effectiveness and confidence in her ability to communicate at work.    Speech Therapy Frequency 1x /week    Duration 8 weeks    Treatment/Interventions SLP instruction and feedback;Compensatory strategies;Patient/family education;Compensatory techniques    Potential to Achieve Goals Good    SLP Home Exercise Plan tongue twisters, dysarthria strategies, personally relevant phrases initiated, see pt instructions    Consulted and Agree with Plan of Care Patient           Patient will benefit from skilled therapeutic intervention in order to improve the following deficits and impairments:   Dysarthria and anarthria    Problem List Patient Active Problem List   Diagnosis Date Noted  . Hypokalemia 12/05/2017  . Hypomagnesemia 12/05/2017  . Adenomatous rectal polyp s/p TEM partial proctectomy 12/04/2017 12/04/2017  . Cerebral thrombosis with cerebral infarction 02/18/2016  . Anisocoria 02/17/2016  . Epilepsy ([place]) 02/17/2016  . Essential tremor 02/17/2016  . HLD (hyperlipidemia) 02/17/2016  . Headache  02/17/2016  . Stroke-like symptoms 02/17/2016    [facility] ,MS, CCC-SLP  11/18/2019, 4:36 PM  [facility] [address] [place], [place], [address] Phone: [phone]   Fax:  [phone]   Name: Alyssa Mccarthy MRN: [id number] Date of Birth: 1965/07/23

## 2019-11-24 MED FILL — LEVETIRACETAM ER 500 MG TAB: 500 | 90 days supply | Qty: 450 | Fill #2

## 2019-11-27 ENCOUNTER — Ambulatory Visit: Payer: 59 | Attending: Endocrinology | Admitting: Speech Pathology

## 2019-11-27 ENCOUNTER — Other Ambulatory Visit: Payer: Self-pay

## 2019-11-27 DIAGNOSIS — R471 Dysarthria and anarthria: Secondary | ICD-10-CM | POA: Insufficient documentation

## 2019-11-27 NOTE — Patient Instructions (Signed)
Add some medical-related terms or names that give you trouble. Practice them SLOWLY  Cholecystitis Lymphoblastic Obstructive Constipation Obstipation Cysts Cholesterol Glioblastoma Osteoporosis

## 2019-11-27 NOTE — Therapy (Signed)
[facility] [address] [place], [place], [address] Phone: [phone]   Fax:  [phone]  Speech Language Pathology Treatment  Patient Details  Name: Alyssa Mccarthy MRN: [id number] Date of Birth: [birth year] Referring Provider (SLP): [name], PA-C   Encounter Date: 11/27/2019   End of Session - 11/27/19 1735    Visit Number 3    Number of Visits 9    Date for SLP Re-Evaluation 12/22/19    SLP Start Time 1533    SLP Stop Time  1615    SLP Time Calculation (min) 42 min    Activity Tolerance Patient tolerated treatment well           Past Medical History:  Diagnosis Date  . Complication of anesthesia   . Essential tremor   . Hyperlipidemia   . Hypothyroidism   . PONV (postoperative nausea and vomiting)   . Seizure disorder ([place])   . Stroke [facility])     Past Surgical History:  Procedure Laterality Date  . ABDOMINAL HYSTERECTOMY    . APPENDECTOMY    . brain stimulator implant  02/2018   replacement   . BRAIN SURGERY  2015   deep brain stimulator implants bilaterally  . CARPAL TUNNEL RELEASE    . PARTIAL PROCTECTOMY BY TEM N/A 12/04/2017   Procedure: PARTIAL PROCTECTOMY REMOVAL OF RECTAL MASS BY TEM ERAS PATHWAY;  Surgeon: Michael Boston, MD;  Location: [facility];  Service: General;  Laterality: N/A;  . TOTAL KNEE ARTHROPLASTY      There were no vitals filed for this visit.   Subjective Assessment - 11/27/19 1534    Subjective "These can't be real words."                 ADULT SLP TREATMENT - 11/27/19 1737      General Information   Behavior/Cognition Alert;Cooperative;Pleasant mood      Treatment Provided   Treatment provided Cognitive-Linquistic      Pain Assessment   Pain Assessment No/denies pain      Cognitive-Linquistic Treatment   Treatment focused on Dysarthria    Skilled Treatment Patient continues to report SLOP compensations have been helpful in daily activities.  Continues to practice tongue twisters and /st/ blends at home. Reported difficulty with /sts/, so SLP broke this down for pt using articulatory cues and visual image and she was able to produce accurately ~85% of the time. Also had pt use these words in a sentence vs in isolation which was helpful for her. Pt used compensations in simple to mod complex conversation, functionally for 10 minute conversation today. We generated list of medical terms/diagnoses with /st/ combinations for pt to add to her practice.      Assessment / Recommendations / Plan   Plan Continue with current plan of care      Progression Toward Goals   Progression toward goals Progressing toward goals              SLP Short Term Goals - 11/27/19 1736      SLP SHORT TERM GOAL #1   Title pt will complete dysarthria HEP for speech intelligibility ("tongue twisters") with >95% use of compensations, in 2 sessions    Baseline 11/27/19    Time 3    Period Weeks    Status On-going      SLP SHORT TERM GOAL #2   Title Pt will use compensations for dysarthria >85% of the time in 5-8 minutes simple  conversation, x2 visits.    Baseline 11/27/19    Time 3    Period Weeks    Status On-going            SLP Long Term Goals - 11/27/19 1736      SLP LONG TERM GOAL #1   Title Patient will use compensations for dysarthria in 15 minutes conversation x 2 visits.    Time 7    Period Weeks    Status On-going      SLP LONG TERM GOAL #2   Title Pt will report improved confidence in ability to communicate at work using dysarthria compensations, strategies.    Time 7    Period Weeks    Status On-going      SLP LONG TERM GOAL #3   Title Pt will report improved communication effectiveness as measured by Communication Effectiveness Survey.    Time 7    Period Weeks    Status On-going            Plan - 11/27/19 1735    Clinical Impression Statement Mrs. Stead presents with mild dysarthria, onset s/p DBS placement for  essential tremor, which has increased over the past year. She continues to report if she speaks quickly her speech is more discoordinated, but she reports frustration level pre and post ST evaluation as 8, and 3 out of 10, respectively, where 10=extreme frustration. 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 I recommend skilled ST to train pt in intelligibility compensations to improve communication effectiveness and confidence in her ability to communicate at work.    Speech Therapy Frequency 1x /week    Duration 8 weeks    Treatment/Interventions SLP instruction and feedback;Compensatory strategies;Patient/family education;Compensatory techniques    Potential to Achieve Goals Good    SLP Home Exercise Plan tongue twisters, dysarthria strategies, personally relevant phrases initiated, see pt instructions    Consulted and Agree with Plan of Care Patient           Patient will benefit from skilled therapeutic intervention in order to improve the following deficits and impairments:   Dysarthria and anarthria    Problem List Patient Active Problem List   Diagnosis Date Noted  . Hypokalemia 12/05/2017  . Hypomagnesemia 12/05/2017  . Adenomatous rectal polyp s/p TEM partial proctectomy 12/04/2017 12/04/2017  . Cerebral thrombosis with cerebral infarction 02/18/2016  . Anisocoria 02/17/2016  . Epilepsy ([place]) 02/17/2016  . Essential tremor 02/17/2016  . HLD (hyperlipidemia) 02/17/2016  . Headache 02/17/2016  . Stroke-like symptoms 02/17/2016   Deneise Lever, [place], CCC-SLP Speech-Language Pathologist  [name] 11/27/2019, 5:45 PM  [facility] [address] [place], [place], [address] Phone: [phone]   Fax:  [phone]   Name: Alyssa Mccarthy MRN: [id number] Date of Birth: August 14, 1965

## 2019-11-28 ENCOUNTER — Other Ambulatory Visit (HOSPITAL_COMMUNITY): Payer: Self-pay | Admitting: Neurology

## 2019-11-28 MED FILL — PROPRANOLOL HCL ER 60 MG CP: 60 | 30 days supply | Qty: 30 | Fill #0

## 2019-11-28 MED FILL — VIT D2 1.25 MG (50,000 UNIT: 1.25 MG | 84 days supply | Qty: 24 | Fill #2

## 2019-12-02 ENCOUNTER — Other Ambulatory Visit: Payer: Self-pay

## 2019-12-02 ENCOUNTER — Ambulatory Visit: Payer: 59 | Admitting: Speech Pathology

## 2019-12-02 DIAGNOSIS — R471 Dysarthria and anarthria: Secondary | ICD-10-CM

## 2019-12-02 NOTE — Therapy (Signed)
[facility] [address] [place], [place], [address] Phone: [phone]   Fax:  [phone]  Speech Language Pathology Treatment  Patient Details  Name: Alyssa Mccarthy MRN: [id number] Date of Birth: 1965/10/16 Referring Provider (SLP): [name], PA-C   Encounter Date: 12/02/2019   End of Session - 12/02/19 1727    Visit Number 4    Number of Visits 9    Date for SLP Re-Evaluation 12/22/19    SLP Start Time 1619    SLP Stop Time  1700    SLP Time Calculation (min) 41 min    Activity Tolerance Patient tolerated treatment well           Past Medical History:  Diagnosis Date  . Complication of anesthesia   . Essential tremor   . Hyperlipidemia   . Hypothyroidism   . PONV (postoperative nausea and vomiting)   . Seizure disorder ([place])   . Stroke [facility])     Past Surgical History:  Procedure Laterality Date  . ABDOMINAL HYSTERECTOMY    . APPENDECTOMY    . brain stimulator implant  02/2018   replacement   . BRAIN SURGERY  2015   deep brain stimulator implants bilaterally  . CARPAL TUNNEL RELEASE    . PARTIAL PROCTECTOMY BY TEM N/A 12/04/2017   Procedure: PARTIAL PROCTECTOMY REMOVAL OF RECTAL MASS BY TEM ERAS PATHWAY;  Surgeon: Michael Boston, MD;  Location: [facility];  Service: General;  Laterality: N/A;  . TOTAL KNEE ARTHROPLASTY      There were no vitals filed for this visit.   Subjective Assessment - 12/02/19 1626    Subjective "Overall I'm feeling a lot more confident."    Currently in Pain? No/denies                 ADULT SLP TREATMENT - 12/02/19 1622      General Information   Behavior/Cognition Alert;Cooperative;Pleasant mood      Treatment Provided   Treatment provided Cognitive-Linquistic      Pain Assessment   Pain Assessment No/denies pain      Cognitive-Linquistic Treatment   Treatment focused on Dysarthria    Skilled Treatment Pt demo'd HEP; reports improved confidence  with /st/, /ch/ blends and pt produced these in isolation/sentences 95% accuracy. Pt was pleased a coworker gave her a compliment that she seemed more confident when she was presenting information. She feels she is able to implement SLOP strategies in most situations and feels she is doing this more naturally. Today pt used compensations in mod complex conversation for 15 minutes; we discussed her progress and plan of care, and pt would like to continue practicing strategies on her own for a couple of weeks; will return 11/30 for a check in with plan to d/c if she is maintaining carryover.       Assessment / Recommendations / Plan   Plan Other (Comment)   return in ~3 weeks     Progression Toward Goals   Progression toward goals Progressing toward goals            SLP Education - 12/02/19 1726    Education Details compensations for dysarthria    Person(s) Educated Patient    Methods Explanation    Comprehension Verbalized understanding            SLP Short Term Goals - 12/02/19 1627      SLP SHORT TERM GOAL #1   Title pt will complete dysarthria HEP  for speech intelligibility ("tongue twisters") with >95% use of compensations, in 2 sessions    Baseline 11/27/19, 12/02/19    Time 2    Period Weeks    Status Achieved      SLP SHORT TERM GOAL #2   Title Pt will use compensations for dysarthria >85% of the time in 5-8 minutes simple conversation, x2 visits.    Baseline 11/27/19, 12/02/19    Time 2    Period Weeks    Status Achieved            SLP Long Term Goals - 12/02/19 1628      SLP LONG TERM GOAL #1   Title Patient will use compensations for dysarthria in 15 minutes conversation x 2 visits.    Time 6    Period Weeks    Status On-going      SLP LONG TERM GOAL #2   Title Pt will report improved confidence in ability to communicate at work using dysarthria compensations, strategies.    Time 6    Period Weeks    Status Achieved      SLP LONG TERM GOAL #3   Title Pt  will report improved communication effectiveness as measured by Communication Effectiveness Survey.    Time 6    Period Weeks    Status On-going            Plan - 12/02/19 1727    Clinical Impression Statement Mrs. Kuck presents with mild dysarthria, onset s/p DBS placement for essential tremor, which has increased over the past year. Patient is using compensations for dysarthria in mod complex conversation today, independently. 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 Pt is pleased with current function but would like to return for check-in in ~3 weeks to ensure she is maintaining her gains. I recommend skilled ST to train pt in intelligibility compensations to improve communication effectiveness and confidence in her ability to communicate at work.    Speech Therapy Frequency 1x /week    Duration 8 weeks    Treatment/Interventions SLP instruction and feedback;Compensatory strategies;Patient/family education;Compensatory techniques    Potential to Achieve Goals Good    SLP Home Exercise Plan tongue twisters, dysarthria strategies, personally relevant phrases initiated, see pt instructions    Consulted and Agree with Plan of Care Patient           Patient will benefit from skilled therapeutic intervention in order to improve the following deficits and impairments:   Dysarthria and anarthria    Problem List Patient Active Problem List   Diagnosis Date Noted  . Hypokalemia 12/05/2017  . Hypomagnesemia 12/05/2017  . Adenomatous rectal polyp s/p TEM partial proctectomy 12/04/2017 12/04/2017  . Cerebral thrombosis with cerebral infarction 02/18/2016  . Anisocoria 02/17/2016  . Epilepsy ([place]) 02/17/2016  . Essential tremor 02/17/2016  . HLD (hyperlipidemia) 02/17/2016  . Headache 02/17/2016  . Stroke-like symptoms 02/17/2016   Deneise Lever, [place],  CCC-SLP Speech-Language Pathologist  Alyssa Mccarthy 12/02/2019, 5:30 PM  [facility] [address] [place], [place], [address] Phone: [phone]   Fax:  [phone]   Name: Alyssa Mccarthy MRN: [id number] Date of Birth: 1965-05-31

## 2019-12-03 ENCOUNTER — Other Ambulatory Visit: Payer: Self-pay | Admitting: Neurology

## 2019-12-03 MED ORDER — ZOLPIDEM TARTRATE 10 MG PO TABS: 10.0000 mg | ORAL_TABLET | Freq: Every day | ORAL | 5 refills | Status: DC

## 2019-12-03 MED FILL — ZOLPIDEM TARTRATE 10 MG TAB: 10 | 30 days supply | Qty: 30 | Fill #0

## 2019-12-08 ENCOUNTER — Ambulatory Visit: Payer: 59

## 2019-12-08 MED FILL — PRIMIDONE 50 MG TABS: 50 | 30 days supply | Qty: 150 | Fill #1

## 2019-12-09 ENCOUNTER — Ambulatory Visit (INDEPENDENT_AMBULATORY_CARE_PROVIDER_SITE_OTHER): Payer: 59 | Admitting: Neurology

## 2019-12-09 ENCOUNTER — Other Ambulatory Visit: Payer: Self-pay

## 2019-12-09 ENCOUNTER — Other Ambulatory Visit: Payer: Self-pay | Admitting: Neurology

## 2019-12-09 ENCOUNTER — Encounter: Payer: Self-pay | Admitting: Neurology

## 2019-12-09 DIAGNOSIS — G4700 Insomnia, unspecified: Secondary | ICD-10-CM

## 2019-12-09 DIAGNOSIS — G40309 Generalized idiopathic epilepsy and epileptic syndromes, not intractable, without status epilepticus: Secondary | ICD-10-CM | POA: Diagnosis not present

## 2019-12-09 MED ORDER — CLONAZEPAM 0.5 MG PO TABS
ORAL_TABLET | ORAL | 5 refills | Status: DC
Start: 2019-12-09 — End: 2020-08-20

## 2019-12-09 MED ORDER — DIVALPROEX SODIUM ER 500 MG PO TB24
500.0000 mg | ORAL_TABLET | Freq: Every day | ORAL | 0 refills | Status: DC
Start: 2019-12-09 — End: 2020-08-20

## 2019-12-09 MED ORDER — LEVETIRACETAM ER 500 MG PO TB24
2500.0000 mg | ORAL_TABLET | Freq: Every day | ORAL | 3 refills | Status: DC
Start: 2019-12-09 — End: 2020-08-20

## 2019-12-09 MED ORDER — LAMOTRIGINE 100 MG PO TABS
100.0000 mg | ORAL_TABLET | Freq: Two times a day (BID) | ORAL | 3 refills | Status: DC
Start: 2019-12-09 — End: 2020-08-20

## 2019-12-09 MED ORDER — SERTRALINE HCL 50 MG PO TABS
ORAL_TABLET | ORAL | 3 refills | Status: DC
Start: 2019-12-09 — End: 2019-12-11

## 2019-12-09 MED FILL — SERTRALINE HCL 50 MG TABLET: 50 | 90 days supply | Qty: 270 | Fill #0

## 2019-12-09 NOTE — Progress Notes (Signed)
NEUROLOGY FOLLOW UP OFFICE NOTE  Alyssa Mccarthy [id number] 23-Mar-1965  HISTORY OF PRESENT ILLNESS: I had the pleasure of seeing Alyssa Mccarthy in follow-up in the neurology clinic on 12/09/2019.  The patient was last seen 6 months ago for seizures. She is alone in the office today. Since her last visit, she called our office in May to report double vision, slowed speech, stumbling, which she did nto remember. She woke up with nausea and a headache, which usually occurs after a seizure. She had a hard time remembering things at work. She denies missing medications or alcohol use. She took prn clonazepam. She denies any further similar symptoms. She attributed the memory issues to Ambien. She takes prn clonazepam when she has an overwhelming odd sense of sleepiness, last intake was 2 weeks ago, she went to bed after. She has completed speech therapy and felt this helped her a lot, she has more confidence in talking. She continues to follow-up with Dr. [name] at [facility] for essential tremor. She is on Levetiracetam XR 2500mg  daily, Lamictal 100mg  BID, Depakote ER 500mg  qhs, and Primidone 100mg  in AM, 150mg  in PM for ET. Mood good on Zoloft 150mg  daily. She was kindly evaluated by sleep specialist Dr. Rexene Alberts due to continued sleep difficulties. She has been taking Ambien 10mg  daily for many years, prescribed by her prior neurologist and refilled in our office. She has found a sleep channel with background sounds has been helpful to reduce melatonin intake to prn (she was previously taking up to 20mg  qhs). She denies any falls but sometimes does not feel steady enough. She has a history of a left Horner's, previously evaluated by neuro-ophthalmology. She feels that sometimes her left eye feels more sensitive and she has to cover it.   History on Initial Assessment 01/29/2017: This is a pleasant 54 yo RH woman with a history of idiopathic generalized epilepsy, essential tremor s/p VIM DBS, presenting to establish care.  She had been seeing epileptologist Dr. [name] at [facility], records were reviewed. She sees Movement Disorder specialist Dr. [name] for the essential tremor. Seizures started around age 54. She reports seizures were well-controlled for 25 years, then after she had her daughter, she was having more seizures for 3-4 years, then overall has been well-controlled the past 10 years. She has no clear aura, describes body jerking that progresses to a generalized convulsion. Flashing lights and sleep deprivation are seizure triggers. She has not had any seizures since around 2015 when Depakote was re-added to her regimen. She is currently on Lamictal 100mg  BID, Keppra XR 2500mg  daily, and Depakote ER 500mg  daily with no side effects. She had been on Depakote for many years in the past but had weight gain, it was stopped, then restarted around 4 years ago. She is also on Primidone for the tremor. She denies any olfactory/gustatory hallucinations, deja vu, rising epigastric sensation, focal numbness/tingling/weakness, myoclonic jerks. She rarely has a funny feeling that "something is not quite right" and would take a prn clonazepam as a precaution, she has not needed this in about a year.   She denies any headaches, dizziness, diplopia, dysarthria/dysphagia, neck/back pain, bowel/bladder dysfunction. Sleep is good. She had been taking Zoloft increased dose 4 years ago due to irritability, improved with higher dose. She was diagnosed with a stroke in January 2018 when she had a bad headache and anisocoria. MRI brain showed subtle T2 hyperintensity in the pons, R>L. She saw Dr. 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 She has  chronic mild right-sided sensory changes since then. She has also had symptoms every once in a while where there is something she can't see (not clearly on peripheral vision), she blinks and vision is back to normal. She has chronic numbness on the left elbow region. She has noticed she has no interest in  eating, but makes herself eat. 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   Diagnostic Data: MRI brain in March 2011 reported as normal. EEG in March 2010 abnormal due to generalized polyspike and wave, consistent with a primary generalized epilepsy Prior AEDs: Phenobarbital, Zonisamide, Dilantin. On a different generic Levetiracetam, she had dry hands and peeling in fingertips, which resolved after switching to a different generic  Epilepsy Risk Factors:  There is a strong family history of seizures in her paternal grandfather, father, and daughter. Otherwise she had a normal birth and early development.  There is no history of febrile convulsions, CNS infections such as meningitis/encephalitis, significant traumatic brain injury, neurosurgical procedures.  PAST MEDICAL HISTORY: Past Medical History:  Diagnosis Date  . Complication of anesthesia   . Essential tremor   . Hyperlipidemia   . Hypothyroidism   . PONV (postoperative nausea and vomiting)   . Seizure disorder ([place])   . Stroke [facility])     MEDICATIONS: Current Outpatient Medications on File Prior to Visit  Medication Sig Dispense Refill  . aspirin EC 325 MG EC tablet Take 1 tablet (325 mg total) by mouth daily. 30 tablet 1  . atorvastatin (LIPITOR) 40 MG tablet Take 1 tablet (40 mg total) by mouth every evening. 30 tablet 1  . clobetasol ointment (TEMOVATE) 0.05 %     . clonazePAM (KLONOPIN) 0.5 MG tablet TAKE 1 TABLET BY MOUTH ONCE A DAY AS NEEDED FOR SEIZURE (DONT USE MORE THAN 2-3 A WEEK) 10 tablet 5  . divalproex (DEPAKOTE ER) 500 MG 24 hr tablet Take 1 tablet (500 mg total) by mouth at bedtime. 90 tablet 0  . Estradiol 10 MCG TABS vaginal tablet Vaginal every 2 weeks    . ezetimibe (ZETIA) 10 MG tablet Take 10 mg by mouth daily.     Marland Kitchen lamoTRIgine (LAMICTAL) 100 MG tablet Take 1 tablet (100 mg total) by mouth 2 (two) times daily. 180 tablet 3  . levETIRAcetam (KEPPRA XR) 500 MG 24 hr tablet Take 5  tablets (2,500 mg total) by mouth daily. Take 5 tablets daily 450 tablet 3  . levothyroxine (SYNTHROID, LEVOTHROID) 88 MCG tablet Take 88 mcg by mouth daily before breakfast.    . Melatonin 5 MG TABS Take by mouth as needed (sleep). 10-20 mg for sleep    . metoprolol succinate (TOPROL-XL) 25 MG 24 hr tablet Take 25 mg by mouth daily.    . primidone (MYSOLINE) 50 MG tablet Take 100-150 mg by mouth See admin instructions. Take 100 mg by mouth in the morning and take 150 mg by mouth in the evening    . propranolol ER (INDERAL LA) 60 MG 24 hr capsule Take 60 mg by mouth daily.     . sertraline (ZOLOFT) 50 MG tablet Take 3 tablets daily 270 tablet 3  . Vitamin D, Ergocalciferol, (DRISDOL) 1.25 MG (50000 UT) CAPS capsule     . zolpidem (AMBIEN) 10 MG tablet Take 1 tablet (10 mg total) by mouth at bedtime. 30 tablet 5   No current facility-administered medications on file prior to visit.    ALLERGIES: Allergies  Allergen Reactions  .  Gluten Meal Diarrhea and Other (See Comments)    Severe diarrhea  . Other Other (See Comments)    Bleach-respiratory distress  . Plavix [Clopidogrel Bisulfate] Itching, Rash and Other (See Comments)    Hands and feet   . Protonix [Pantoprazole] Itching and Other (See Comments)    Whelps  . Penicillins Itching, Rash and Other (See Comments)    Childhood allergy- specifics unknown; tolerated amoxicillin and ancef since then     FAMILY HISTORY: Family History  Problem Relation Age of Onset  . Heart attack Father 44  . Stroke Neg Hx   . Breast cancer Neg Hx     SOCIAL HISTORY: Social History   Socioeconomic History  . Marital status: Married    Spouse name: Not on file  . Number of children: Not on file  . Years of education: Not on file  . Highest education level: Not on file  Occupational History  . Not on file  Tobacco Use  . Smoking status: Never Smoker  . Smokeless tobacco: Never Used  Vaping Use  . Vaping Use: Never used  Substance and  Sexual Activity  . Alcohol use: Yes    Comment: socially  . Drug use: No  . Sexual activity: Not on file  Other Topics Concern  . Not on file  Social History Narrative   Lives in 1 story home with her husband and 2 sons   Has 2 adult sons   Has bach. Degree   Works as Designer, jewellery   Right handed   Social Determinants of [place] Strain:   . Difficulty of Paying Living Expenses: Not on file  Food Insecurity:   . Worried About Charity fundraiser in the Last Year: Not on file  . Ran Out of Food in the Last Year: Not on file  Transportation Needs:   . Lack of Transportation (Medical): Not on file  . Lack of Transportation (Non-Medical): Not on file  Physical Activity:   . Days of Exercise per Week: Not on file  . Minutes of Exercise per Session: Not on file  Stress:   . Feeling of Stress : Not on file  Social Connections:   . Frequency of Communication with Friends and Family: Not on file  . Frequency of Social Gatherings with Friends and Family: Not on file  . Attends Religious Services: Not on file  . Active Member of Clubs or Organizations: Not on file  . Attends Archivist Meetings: Not on file  . Marital Status: Not on file  Intimate Partner Violence:   . Fear of Current or Ex-Partner: Not on file  . Emotionally Abused: Not on file  . Physically Abused: Not on file  . Sexually Abused: Not on file     PHYSICAL EXAM: General: No acute distress Head:  Normocephalic/atraumatic Skin/Extremities: No rash, no edema Neurological Exam: alert and oriented to person, place, and time. No aphasia or dysarthria. Fund of knowledge is appropriate.  Recent and remote memory are intact.  Attention and concentration are normal.   Cranial nerves: Right pupil 45mm, left pupil 8mm, reactive to light. Extraocular movements intact with no nystagmus. Visual fields full.  No facial asymmetry.  Motor: Bulk and tone normal, muscle strength 5/5 throughout with  no pronator drift.   Finger to nose testing intact.  Gait narrow-based and steady, able to tandem walk adequately.  Romberg positive. She has a left hand tremor today.    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 Seizures overall stable on current regimen of Keppra XR 2500mg  daily, Lamotrigine 100mg  BID and Depakote ER 500mg  qhs. She has prn clonazepam for breakthrough seizures. Refills for medication sent. We discussed concern for long-term use of Ambien, she has been on this for many years prescribed by her prior providers. Will contact Dr. Rexene Alberts for suggestions/recommendations. She is reporting more vision changes on left eye, no clear changes compared to prior exam. Follow-up with ophthalmology. She is aware of [place] driving laws to stop driving after a seizure until 6 months seizure-free. Follow-up in 6-8 months, she knows to call for any changes.     Thank you for allowing me to participate in her care.  Please do not hesitate to call for any questions or concerns.   Ellouise Newer, M.D.   CC: Dr. Forde Dandy

## 2019-12-09 NOTE — Patient Instructions (Signed)
1. Continue all your medications  2. I will contact Dr. Rexene Alberts for any other suggestions aside from the Ambien  3. Follow-up with eye doctor  4. Follow-up in 6-8 months, call for any changes   Seizure Precautions: 1. If medication has been prescribed for you to prevent seizures, take it exactly as directed.  Do not stop taking the medicine without talking to your doctor first, even if you have not had a seizure in a long time.   2. Avoid activities in which a seizure would cause danger to yourself or to others.  Don't operate dangerous machinery, swim alone, or climb in high or dangerous places, such as on ladders, roofs, or girders.  Do not drive unless your doctor says you may.  3. If you have any warning that you may have a seizure, lay down in a safe place where you can't hurt yourself.    4.  No driving for 6 months from last seizure, as per [facility].   Please refer to the following link on the [place] website for more information: http://www.epilepsyfoundation.org/answerplace/Social/driving/drivingu.cfm   5.  Maintain good sleep hygiene. Avoid alcohol.  6.  Contact your doctor if you have any problems that may be related to the medicine you are taking.  7.  Call 911 and bring the patient back to the ED if:        A.  The seizure lasts longer than 5 minutes.       B.  The patient doesn't awaken shortly after the seizure  C.  The patient has new problems such as difficulty seeing, speaking or moving  D.  The patient was injured during the seizure  E.  The patient has a temperature over 102 F (39C)  F.  The patient vomited and now is having trouble breathing

## 2019-12-11 ENCOUNTER — Other Ambulatory Visit: Payer: Self-pay

## 2019-12-11 ENCOUNTER — Other Ambulatory Visit: Payer: Self-pay | Admitting: Neurology

## 2019-12-11 MED ORDER — SERTRALINE HCL 50 MG PO TABS
ORAL_TABLET | ORAL | 1 refills | Status: DC
Start: 2019-12-11 — End: 2020-08-20

## 2019-12-11 NOTE — Telephone Encounter (Signed)
Refill sent in

## 2019-12-15 MED FILL — clonazePAM 0.5 MG TABS: 0.5 | 30 days supply | Qty: 10 | Fill #1

## 2019-12-16 ENCOUNTER — Other Ambulatory Visit (HOSPITAL_COMMUNITY): Payer: Self-pay | Admitting: Endocrinology

## 2019-12-16 DIAGNOSIS — M1711 Unilateral primary osteoarthritis, right knee: Secondary | ICD-10-CM | POA: Diagnosis not present

## 2019-12-16 MED FILL — AMLODIPINE BESYLATE 5 MG TA: 5 | 90 days supply | Qty: 90 | Fill #0

## 2019-12-22 MED FILL — PROPRANOLOL HCL ER 60 MG CP: 60 | 30 days supply | Qty: 30 | Fill #1

## 2019-12-23 ENCOUNTER — Ambulatory Visit: Payer: 59

## 2019-12-23 DIAGNOSIS — N762 Acute vulvitis: Secondary | ICD-10-CM | POA: Diagnosis not present

## 2019-12-31 MED FILL — ZOLPIDEM TARTRATE 10 MG TAB: 10 | 30 days supply | Qty: 30 | Fill #1

## 2020-01-02 MED FILL — SYNTHROID 88 MCG TABLET: 88 | 90 days supply | Qty: 90 | Fill #1

## 2020-01-07 MED FILL — PRIMIDONE 50 MG TABS: 50 | 30 days supply | Qty: 150 | Fill #2

## 2020-01-08 ENCOUNTER — Telehealth: Payer: Self-pay | Admitting: Neurology

## 2020-01-08 MED FILL — LAMOTRIGINE 100 MG TABS: 100 | 90 days supply | Qty: 180 | Fill #0

## 2020-01-08 NOTE — Telephone Encounter (Signed)
Sent in electronic at her appointment 12/09/19

## 2020-01-09 ENCOUNTER — Telehealth: Payer: Self-pay | Admitting: Neurology

## 2020-01-09 NOTE — Telephone Encounter (Signed)
Reordered 12/09/19 pharmacy has the script

## 2020-01-10 ENCOUNTER — Other Ambulatory Visit (HOSPITAL_COMMUNITY): Payer: Self-pay | Admitting: Endocrinology

## 2020-01-10 MED FILL — ATORVASTATIN 40 MG TABLET: 40 | 90 days supply | Qty: 90 | Fill #0

## 2020-01-10 MED FILL — DIVALPROEX SOD ER 500 MG TA: 500 | 90 days supply | Qty: 90 | Fill #1

## 2020-01-14 MED FILL — clonazePAM 0.5 MG TABS: 0.5 | 30 days supply | Qty: 10 | Fill #2

## 2020-01-20 MED FILL — ESTRADIOL 10 MCG TABS: 10 | 90 days supply | Qty: 26 | Fill #2

## 2020-01-21 MED FILL — PROPRANOLOL HCL ER 60 MG CP: 60 | 30 days supply | Qty: 30 | Fill #2

## 2020-01-22 MED FILL — CLOBETASOL PROPIONATE 0.05: 0.05 | 10 days supply | Qty: 30 | Fill #1

## 2020-01-26 ENCOUNTER — Ambulatory Visit: Payer: 59

## 2020-01-27 DIAGNOSIS — Z9689 Presence of other specified functional implants: Secondary | ICD-10-CM | POA: Diagnosis not present

## 2020-01-27 DIAGNOSIS — Z4542 Encounter for adjustment and management of neuropacemaker (brain) (peripheral nerve) (spinal cord): Secondary | ICD-10-CM | POA: Diagnosis not present

## 2020-01-27 DIAGNOSIS — Z9682 Presence of neurostimulator: Secondary | ICD-10-CM | POA: Diagnosis not present

## 2020-01-27 DIAGNOSIS — G250 Essential tremor: Secondary | ICD-10-CM | POA: Diagnosis not present

## 2020-01-30 ENCOUNTER — Telehealth: Payer: Self-pay | Admitting: Neurology

## 2020-01-30 ENCOUNTER — Other Ambulatory Visit: Payer: Self-pay | Admitting: Neurology

## 2020-01-30 ENCOUNTER — Other Ambulatory Visit: Payer: Self-pay

## 2020-01-30 MED ORDER — ZOLPIDEM TARTRATE 10 MG PO TABS
10.0000 mg | ORAL_TABLET | Freq: Every day | ORAL | 5 refills | Status: DC
Start: 2020-01-30 — End: 2020-01-30

## 2020-01-30 MED FILL — ZOLPIDEM TARTRATE 10 MG TAB: 10 | 30 days supply | Qty: 30 | Fill #0

## 2020-02-04 DIAGNOSIS — Z880 Allergy status to penicillin: Secondary | ICD-10-CM | POA: Diagnosis not present

## 2020-02-04 DIAGNOSIS — Z9689 Presence of other specified functional implants: Secondary | ICD-10-CM | POA: Diagnosis not present

## 2020-02-04 DIAGNOSIS — Z888 Allergy status to other drugs, medicaments and biological substances status: Secondary | ICD-10-CM | POA: Diagnosis not present

## 2020-02-04 DIAGNOSIS — G250 Essential tremor: Secondary | ICD-10-CM | POA: Diagnosis not present

## 2020-02-06 MED FILL — PRIMIDONE 50 MG TABS: 50 | 30 days supply | Qty: 150 | Fill #3

## 2020-02-13 MED FILL — clonazePAM 0.5 MG TABS: 0.5 | 30 days supply | Qty: 10 | Fill #3

## 2020-02-19 DIAGNOSIS — Z20822 Contact with and (suspected) exposure to covid-19: Secondary | ICD-10-CM | POA: Diagnosis not present

## 2020-02-19 DIAGNOSIS — Z03818 Encounter for observation for suspected exposure to other biological agents ruled out: Secondary | ICD-10-CM | POA: Diagnosis not present

## 2020-02-20 ENCOUNTER — Other Ambulatory Visit (HOSPITAL_COMMUNITY): Payer: Self-pay | Admitting: Endocrinology

## 2020-02-20 MED FILL — PROPRANOLOL HCL ER 60 MG CP: 60 | 30 days supply | Qty: 30 | Fill #3

## 2020-02-21 MED FILL — VIT D2 1.25 MG (50,000 UNIT: 1.25 MG | 84 days supply | Qty: 24 | Fill #0

## 2020-02-23 MED FILL — LEVETIRACETAM ER 500 MG TAB: 500 | 90 days supply | Qty: 450 | Fill #3

## 2020-02-27 MED FILL — ZOLPIDEM TARTRATE 10 MG TAB: 10 | 30 days supply | Qty: 30 | Fill #1

## 2020-03-02 MED FILL — SERTRALINE HCL 50 MG TABLET: 50 | 90 days supply | Qty: 270 | Fill #1

## 2020-03-08 ENCOUNTER — Other Ambulatory Visit (HOSPITAL_COMMUNITY): Payer: Self-pay | Admitting: Physician Assistant

## 2020-03-08 DIAGNOSIS — M25562 Pain in left knee: Secondary | ICD-10-CM | POA: Diagnosis not present

## 2020-03-08 MED FILL — PRIMIDONE 50 MG TABS: 50 | 30 days supply | Qty: 150 | Fill #4

## 2020-03-09 MED FILL — AMLODIPINE BESYLATE 5 MG TA: 5 | 90 days supply | Qty: 90 | Fill #1

## 2020-03-09 MED FILL — METHYLPREDNISOLONE 4 MG TBP: 4 | 6 days supply | Qty: 21 | Fill #0

## 2020-03-15 MED FILL — clonazePAM 0.5 MG TABS: 0.5 | 30 days supply | Qty: 10 | Fill #4

## 2020-03-18 DIAGNOSIS — M25562 Pain in left knee: Secondary | ICD-10-CM | POA: Diagnosis not present

## 2020-03-22 MED FILL — PROPRANOLOL HCL ER 60 MG CP: 60 | 30 days supply | Qty: 30 | Fill #4

## 2020-03-29 MED FILL — ZOLPIDEM TARTRATE 10 MG TAB: 10 | 30 days supply | Qty: 30 | Fill #2

## 2020-04-01 MED FILL — SYNTHROID 88 MCG TABLET: 88 | 90 days supply | Qty: 90 | Fill #2

## 2020-04-01 MED FILL — LAMOTRIGINE 100 MG TABS: 100 | 90 days supply | Qty: 180 | Fill #1

## 2020-04-03 MED FILL — ATORVASTATIN 40 MG TABLET: 40 | 90 days supply | Qty: 90 | Fill #1

## 2020-04-08 ENCOUNTER — Other Ambulatory Visit (HOSPITAL_COMMUNITY): Payer: Self-pay | Admitting: Endocrinology

## 2020-04-13 MED FILL — clonazePAM 0.5 MG TABS: 0.5 | 30 days supply | Qty: 10 | Fill #5

## 2020-04-16 ENCOUNTER — Other Ambulatory Visit (HOSPITAL_BASED_OUTPATIENT_CLINIC_OR_DEPARTMENT_OTHER): Payer: Self-pay

## 2020-04-19 DIAGNOSIS — M25562 Pain in left knee: Secondary | ICD-10-CM | POA: Diagnosis not present

## 2020-04-20 MED FILL — PROPRANOLOL HCL ER 60 MG CP: 60 | 30 days supply | Qty: 30 | Fill #5

## 2020-04-25 ENCOUNTER — Other Ambulatory Visit: Payer: Self-pay

## 2020-05-14 ENCOUNTER — Other Ambulatory Visit (HOSPITAL_COMMUNITY): Payer: Self-pay

## 2020-05-14 DIAGNOSIS — M23322 Other meniscus derangements, posterior horn of medial meniscus, left knee: Secondary | ICD-10-CM | POA: Diagnosis not present

## 2020-05-14 DIAGNOSIS — M2341 Loose body in knee, right knee: Secondary | ICD-10-CM | POA: Diagnosis not present

## 2020-05-14 DIAGNOSIS — M2342 Loose body in knee, left knee: Secondary | ICD-10-CM | POA: Diagnosis not present

## 2020-05-14 DIAGNOSIS — S83282A Other tear of lateral meniscus, current injury, left knee, initial encounter: Secondary | ICD-10-CM | POA: Diagnosis not present

## 2020-05-14 DIAGNOSIS — S83242A Other tear of medial meniscus, current injury, left knee, initial encounter: Secondary | ICD-10-CM | POA: Diagnosis not present

## 2020-05-14 DIAGNOSIS — G8918 Other acute postprocedural pain: Secondary | ICD-10-CM | POA: Diagnosis not present

## 2020-05-14 DIAGNOSIS — M94261 Chondromalacia, right knee: Secondary | ICD-10-CM | POA: Diagnosis not present

## 2020-05-14 DIAGNOSIS — M23342 Other meniscus derangements, anterior horn of lateral meniscus, left knee: Secondary | ICD-10-CM | POA: Diagnosis not present

## 2020-05-14 MED ORDER — MELOXICAM 15 MG PO TABS
ORAL_TABLET | ORAL | 1 refills | Status: DC
  Filled 2020-05-14: qty 60, 55d supply, fill #0
  Filled 2020-12-19: qty 60, 55d supply, fill #1

## 2020-05-14 MED ORDER — TRAMADOL HCL 50 MG PO TABS
50.0000 mg | ORAL_TABLET | Freq: Four times a day (QID) | ORAL | 0 refills | Status: DC | PRN
  Filled 2020-05-14: qty 30, 8d supply, fill #0

## 2020-05-20 ENCOUNTER — Other Ambulatory Visit (HOSPITAL_COMMUNITY): Payer: Self-pay

## 2020-05-20 MED FILL — Zolpidem Tartrate Tab 10 MG: ORAL | 30 days supply | Qty: 30 | Fill #0 | Status: AC

## 2020-05-20 MED FILL — Divalproex Sodium Tab ER 24 HR 500 MG: ORAL | 90 days supply | Qty: 90 | Fill #0 | Status: CN

## 2020-05-21 ENCOUNTER — Other Ambulatory Visit (HOSPITAL_COMMUNITY): Payer: Self-pay

## 2020-05-21 MED ORDER — LEVETIRACETAM ER 500 MG PO TB24
ORAL_TABLET | ORAL | 3 refills | Status: DC
  Filled 2020-05-21: qty 450, 90d supply, fill #0
  Filled 2020-08-16: qty 450, 90d supply, fill #1

## 2020-05-22 ENCOUNTER — Other Ambulatory Visit (HOSPITAL_COMMUNITY): Payer: Self-pay

## 2020-05-24 ENCOUNTER — Other Ambulatory Visit (HOSPITAL_COMMUNITY): Payer: Self-pay

## 2020-05-24 MED ORDER — PRIMIDONE 50 MG PO TABS
ORAL_TABLET | ORAL | 5 refills | Status: DC
  Filled 2020-05-24: qty 150, 30d supply, fill #0
  Filled 2020-06-21: qty 150, 30d supply, fill #1
  Filled 2020-07-25: qty 150, 30d supply, fill #2
  Filled 2020-08-22: qty 150, 30d supply, fill #3
  Filled 2020-09-26: qty 150, 30d supply, fill #4
  Filled 2020-10-27: qty 150, 30d supply, fill #5

## 2020-06-11 DIAGNOSIS — G250 Essential tremor: Secondary | ICD-10-CM | POA: Diagnosis not present

## 2020-06-11 DIAGNOSIS — G40409 Other generalized epilepsy and epileptic syndromes, not intractable, without status epilepticus: Secondary | ICD-10-CM | POA: Diagnosis not present

## 2020-06-11 DIAGNOSIS — Z9682 Presence of neurostimulator: Secondary | ICD-10-CM | POA: Diagnosis not present

## 2020-06-11 DIAGNOSIS — Z79899 Other long term (current) drug therapy: Secondary | ICD-10-CM | POA: Diagnosis not present

## 2020-06-11 DIAGNOSIS — Z4542 Encounter for adjustment and management of neuropacemaker (brain) (peripheral nerve) (spinal cord): Secondary | ICD-10-CM | POA: Diagnosis not present

## 2020-06-14 DIAGNOSIS — Z20822 Contact with and (suspected) exposure to covid-19: Secondary | ICD-10-CM | POA: Diagnosis not present

## 2020-06-22 ENCOUNTER — Other Ambulatory Visit (HOSPITAL_COMMUNITY): Payer: Self-pay

## 2020-06-25 ENCOUNTER — Other Ambulatory Visit (HOSPITAL_COMMUNITY): Payer: Self-pay

## 2020-06-25 MED FILL — Atorvastatin Calcium Tab 40 MG (Base Equivalent): ORAL | 90 days supply | Qty: 90 | Fill #0 | Status: AC

## 2020-06-25 MED FILL — Propranolol HCl Cap ER 24HR 60 MG: ORAL | 30 days supply | Qty: 30 | Fill #0 | Status: AC

## 2020-06-25 MED FILL — Zolpidem Tartrate Tab 10 MG: ORAL | 30 days supply | Qty: 30 | Fill #0 | Status: AC

## 2020-06-25 MED FILL — Amlodipine Besylate Tab 5 MG (Base Equivalent): ORAL | 90 days supply | Qty: 90 | Fill #0 | Status: AC

## 2020-07-04 MED FILL — Ezetimibe Tab 10 MG: ORAL | 90 days supply | Qty: 90 | Fill #0 | Status: AC

## 2020-07-05 ENCOUNTER — Other Ambulatory Visit (HOSPITAL_COMMUNITY): Payer: Self-pay

## 2020-07-10 MED FILL — Sertraline HCl Tab 50 MG: ORAL | 90 days supply | Qty: 270 | Fill #0 | Status: AC

## 2020-07-12 ENCOUNTER — Other Ambulatory Visit (HOSPITAL_COMMUNITY): Payer: Self-pay

## 2020-07-25 MED FILL — Zolpidem Tartrate Tab 10 MG: ORAL | 30 days supply | Qty: 30 | Fill #1 | Status: AC

## 2020-07-25 MED FILL — Ergocalciferol Cap 1.25 MG (50000 Unit): ORAL | 84 days supply | Qty: 24 | Fill #0 | Status: AC

## 2020-07-26 ENCOUNTER — Other Ambulatory Visit (HOSPITAL_COMMUNITY): Payer: Self-pay

## 2020-07-27 ENCOUNTER — Other Ambulatory Visit (HOSPITAL_COMMUNITY): Payer: Self-pay

## 2020-07-28 DIAGNOSIS — G250 Essential tremor: Secondary | ICD-10-CM | POA: Diagnosis not present

## 2020-07-28 DIAGNOSIS — F418 Other specified anxiety disorders: Secondary | ICD-10-CM | POA: Diagnosis not present

## 2020-08-02 ENCOUNTER — Other Ambulatory Visit (HOSPITAL_COMMUNITY): Payer: Self-pay

## 2020-08-02 MED FILL — Propranolol HCl Cap ER 24HR 60 MG: ORAL | 30 days supply | Qty: 30 | Fill #1 | Status: AC

## 2020-08-16 ENCOUNTER — Other Ambulatory Visit (HOSPITAL_COMMUNITY): Payer: Self-pay

## 2020-08-16 ENCOUNTER — Other Ambulatory Visit: Payer: Self-pay | Admitting: Neurology

## 2020-08-16 NOTE — Telephone Encounter (Signed)
Refills at appointment 08/20/20

## 2020-08-17 ENCOUNTER — Other Ambulatory Visit (HOSPITAL_COMMUNITY): Payer: Self-pay

## 2020-08-18 ENCOUNTER — Other Ambulatory Visit (HOSPITAL_COMMUNITY): Payer: Self-pay

## 2020-08-20 ENCOUNTER — Encounter: Payer: Self-pay | Admitting: Neurology

## 2020-08-20 ENCOUNTER — Other Ambulatory Visit: Payer: Self-pay

## 2020-08-20 ENCOUNTER — Other Ambulatory Visit (HOSPITAL_COMMUNITY): Payer: Self-pay

## 2020-08-20 ENCOUNTER — Ambulatory Visit: Payer: 59 | Admitting: Neurology

## 2020-08-20 VITALS — BP 144/71 | HR 63 | Ht 62.0 in | Wt 169.0 lb

## 2020-08-20 DIAGNOSIS — G4700 Insomnia, unspecified: Secondary | ICD-10-CM

## 2020-08-20 DIAGNOSIS — G40309 Generalized idiopathic epilepsy and epileptic syndromes, not intractable, without status epilepticus: Secondary | ICD-10-CM

## 2020-08-20 DIAGNOSIS — F32A Depression, unspecified: Secondary | ICD-10-CM

## 2020-08-20 MED ORDER — DIVALPROEX SODIUM ER 500 MG PO TB24
ORAL_TABLET | Freq: Every day | ORAL | 3 refills | Status: DC
  Filled 2020-08-20: qty 90, 90d supply, fill #0

## 2020-08-20 MED ORDER — ZOLPIDEM TARTRATE 5 MG PO TABS
5.0000 mg | ORAL_TABLET | Freq: Every day | ORAL | 5 refills | Status: DC
  Filled 2020-08-20 – 2020-09-19 (×2): qty 30, 30d supply, fill #0
  Filled 2020-10-08 – 2020-10-18 (×2): qty 30, 30d supply, fill #1
  Filled 2020-11-17: qty 30, 30d supply, fill #2
  Filled 2020-12-19: qty 30, 30d supply, fill #3
  Filled 2021-01-07 – 2021-01-17 (×2): qty 30, 30d supply, fill #4
  Filled 2021-02-05 – 2021-02-14 (×3): qty 30, 30d supply, fill #5

## 2020-08-20 MED ORDER — SERTRALINE HCL 50 MG PO TABS
ORAL_TABLET | Freq: Every day | ORAL | 3 refills | Status: DC
  Filled 2020-08-20: qty 270, fill #0
  Filled 2020-10-17: qty 270, 90d supply, fill #0
  Filled 2021-02-05: qty 270, 90d supply, fill #1
  Filled 2021-05-13: qty 270, 90d supply, fill #2

## 2020-08-20 MED ORDER — LAMOTRIGINE 100 MG PO TABS
ORAL_TABLET | Freq: Two times a day (BID) | ORAL | 3 refills | Status: DC
  Filled 2020-08-20: qty 180, 90d supply, fill #0
  Filled 2020-11-17: qty 180, 90d supply, fill #1
  Filled 2021-02-13: qty 180, 90d supply, fill #2

## 2020-08-20 MED ORDER — CLONAZEPAM 0.5 MG PO TABS
ORAL_TABLET | ORAL | 5 refills | Status: DC
Start: 2020-08-20 — End: 2021-04-20
  Filled 2020-08-20: qty 10, 60d supply, fill #0
  Filled 2020-12-19: qty 10, 60d supply, fill #1

## 2020-08-20 MED ORDER — LEVETIRACETAM ER 500 MG PO TB24
ORAL_TABLET | ORAL | 3 refills | Status: DC
  Filled 2020-08-20: qty 450, fill #0
  Filled 2020-11-30: qty 450, 90d supply, fill #0
  Filled 2021-03-04: qty 450, 90d supply, fill #1

## 2020-08-20 NOTE — Patient Instructions (Signed)
Always good to see you! Continue all your medications. A new prescription for Ambien '5mg'$  every night has been sent. Proceed with sleep study as discussed. Follow-up in 8 months, call for any changes   Seizure Precautions: 1. If medication has been prescribed for you to prevent seizures, take it exactly as directed.  Do not stop taking the medicine without talking to your doctor first, even if you have not had a seizure in a long time.   2. Avoid activities in which a seizure would cause danger to yourself or to others.  Don't operate dangerous machinery, swim alone, or climb in high or dangerous places, such as on ladders, roofs, or girders.  Do not drive unless your doctor says you may.  3. If you have any warning that you may have a seizure, lay down in a safe place where you can't hurt yourself.    4.  No driving for 6 months from last seizure, as per [facility].   Please refer to the following link on the [place] website for more information: http://www.epilepsyfoundation.org/answerplace/Social/driving/drivingu.cfm   5.  Maintain good sleep hygiene. Avoid alcohol.  6.  Contact your doctor if you have any problems that may be related to the medicine you are taking.  7.  Call 911 and bring the patient back to the ED if:        A.  The seizure lasts longer than 5 minutes.       B.  The patient doesn't awaken shortly after the seizure  C.  The patient has new problems such as difficulty seeing, speaking or moving  D.  The patient was injured during the seizure  E.  The patient has a temperature over 102 F (39C)  F.  The patient vomited and now is having trouble breathing

## 2020-08-20 NOTE — Progress Notes (Signed)
NEUROLOGY FOLLOW UP OFFICE NOTE  DRUSILLA BAUMEISTER JA:[id number] [birth year]  HISTORY OF PRESENT ILLNESS: I had the pleasure of seeing Alyssa Mccarthy in follow-up in the neurology clinic on 08/20/2020.  The patient was last seen 8 months ago for seizures. She is alone in the office today. Records and images were personally reviewed where available.  Since her last visit, she underwent Neuropsychological testing at [facility] on 07/28/20 with profile showing deficits on measures of speeded word reading and color naming, numeric sequencing, set-shifting, and fine motor dexterity (L>R), suggestive of mild frontal-subcortical dysfunction consistent with diagnosis o essential tremor, results generally stable since 2015 evaluation. She continues to deny any seizures since May 2021, at which time she reported double vision, slowed speech, stumbling, and had a hard time remembering things at work. She is on Levetiracetam XR '2500mg'$  daily, Lamictal '100mg'$  BID, Depakote ER '500mg'$  qhs, and Primidone '100mg'$  in AM, '150mg'$  in PM for ET. She has prn clonazepam for times when she has an overwhelming sense of sleepiness, and has not needed it for this reason, but takes it every once in a while for anxiety and to help with sleep. She is happy to report that she was able to cut down Ambien dose to '5mg'$  every night and takes it with melatonin gummies 2.5-'5mg'$ . She gets 8 hours of refreshing sleep. She notes weight gain despite exercise and healthy eating, she feels this is due to Depakote, but reports that Depakote is the only medication which has helped her the most. Mood is good overall, she is on Sertraline '150mg'$  daily.   History on Initial Assessment 01/29/2017: This is a pleasant 55 yo RH woman with a history of idiopathic generalized epilepsy, essential tremor s/p VIM DBS, presenting to establish care. She had been seeing epileptologist Dr. [name] at [facility], records were reviewed. She sees Movement Disorder specialist Dr. [name] for the  essential tremor. Seizures started around age 42. She reports seizures were well-controlled for 25 years, then after she had her daughter, she was having more seizures for 3-4 years, then overall has been well-controlled the past 10 years. She has no clear aura, describes body jerking that progresses to a generalized convulsion. Flashing lights and sleep deprivation are seizure triggers. She has not had any seizures since around 2015 when Depakote was re-added to her regimen. She is currently on Lamictal '100mg'$  BID, Keppra XR '2500mg'$  daily, and Depakote ER '500mg'$  daily with no side effects. She had been on Depakote for many years in the past but had weight gain, it was stopped, then restarted around 4 years ago. She is also on Primidone for the tremor. She denies any olfactory/gustatory hallucinations, deja vu, rising epigastric sensation, focal numbness/tingling/weakness, myoclonic jerks. She rarely has a funny feeling that "something is not quite right" and would take a prn clonazepam as a precaution, she has not needed this in about a year.    She denies any headaches, dizziness, diplopia, dysarthria/dysphagia, neck/back pain, bowel/bladder dysfunction. Sleep is good. She had been taking Zoloft increased dose 4 years ago due to irritability, improved with higher dose. She was diagnosed with a stroke in January 2018 when she had a bad headache and anisocoria. MRI brain showed subtle T2 hyperintensity in the pons, R>L. She saw Dr. 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 She has chronic mild right-sided sensory changes since then. She has also had symptoms every once in a while where there is something she can't see (not clearly on peripheral vision), she  blinks and vision is back to normal. She has chronic numbness on the left elbow region. She has noticed she has no interest in eating, but makes herself eat. 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    Diagnostic Data: MRI brain in  March 2011 reported as normal. EEG in March 2010 abnormal due to generalized polyspike and wave, consistent with a primary generalized epilepsy Prior AEDs: Phenobarbital, Zonisamide, Dilantin. On a different generic Levetiracetam, she had dry hands and peeling in fingertips, which resolved after switching to a different generic   Epilepsy Risk Factors:  There is a strong family history of seizures in her paternal grandfather, father, and daughter. Otherwise she had a normal birth and early development.  There is no history of febrile convulsions, CNS infections such as meningitis/encephalitis, significant traumatic brain injury, neurosurgical procedures.  PAST MEDICAL HISTORY: Past Medical History:  Diagnosis Date   Complication of anesthesia    Essential tremor    Hyperlipidemia    Hypothyroidism    PONV (postoperative nausea and vomiting)    Seizure disorder (HCC)    Stroke [facility])     MEDICATIONS: Current Outpatient Medications on File Prior to Visit  Medication Sig Dispense Refill   amLODipine (NORVASC) 5 MG tablet TAKE 1 TABLET BY MOUTH ONCE DAILY 90 tablet 3   aspirin EC 325 MG EC tablet Take 1 tablet (325 mg total) by mouth daily. 30 tablet 1   atorvastatin (LIPITOR) 40 MG tablet Take 1 tablet (40 mg total) by mouth every evening. 30 tablet 1   atorvastatin (LIPITOR) 40 MG tablet TAKE 1 TABLET BY MOUTH DAILY 90 tablet 2   clobetasol ointment (TEMOVATE) 0.05 %      clonazePAM (KLONOPIN) 0.5 MG tablet TAKE 1 TABLET BY MOUTH ONCE A DAY AS NEEDED FOR SEIZURE (DONT USE MORE THAN 2-3 A WEEK) 10 tablet 5   divalproex (DEPAKOTE ER) 500 MG 24 hr tablet Take 1 tablet (500 mg total) by mouth at bedtime. 90 tablet 0   divalproex (DEPAKOTE ER) 500 MG 24 hr tablet TAKE 1 TABLET BY MOUTH AT BEDTIME 90 tablet 3   Estradiol 10 MCG TABS vaginal tablet Vaginal every 2 weeks     ezetimibe (ZETIA) 10 MG tablet Take 10 mg by mouth daily.      ezetimibe (ZETIA) 10 MG tablet TAKE 1 TABLET BY MOUTH ONCE A  DAY 90 tablet 2   lamoTRIgine (LAMICTAL) 100 MG tablet Take 1 tablet (100 mg total) by mouth 2 (two) times daily. 180 tablet 3   lamoTRIgine (LAMICTAL) 100 MG tablet TAKE 1 TABLET BY MOUTH TWO TIMES DAILY 180 tablet 3   levETIRAcetam (KEPPRA XR) 500 MG 24 hr tablet Take 5 tablets (2,500 mg total) by mouth daily. Take 5 tablets daily 450 tablet 3   levETIRAcetam (KEPPRA XR) 500 MG 24 hr tablet take 5 tablets by mouth daily 450 tablet 3   levothyroxine (SYNTHROID, LEVOTHROID) 88 MCG tablet Take 88 mcg by mouth daily before breakfast.     Melatonin 5 MG TABS Take by mouth as needed (sleep). 10-20 mg for sleep     meloxicam (MOBIC) 15 MG tablet Take 1 tablet(s) by mouth twice daily for 5 days, then take one tablet by mouth daily as needed 60 tablet 1   methylPREDNISolone (MEDROL DOSEPAK) 4 MG TBPK tablet TAKE AS DIRECTED FOR 6 DAYS 21 each 0   metoprolol succinate (TOPROL-XL) 25 MG 24 hr tablet Take 25 mg by mouth  daily.     primidone (MYSOLINE) 50 MG tablet Take 100-150 mg by mouth See admin instructions. Take 100 mg by mouth in the morning and take 150 mg by mouth in the evening     primidone (MYSOLINE) 50 MG tablet TAKE 2 TABLETS BY MOUTH EVERY MORNING AND 3 TABLETS IN THE EVENING 150 tablet 5   propranolol ER (INDERAL LA) 60 MG 24 hr capsule Take 60 mg by mouth daily.      propranolol ER (INDERAL LA) 60 MG 24 hr capsule TAKE 1 CAPSULE BY MOUTH ONCE DAILY 30 capsule 11   sertraline (ZOLOFT) 50 MG tablet Take 3 tablets daily 270 tablet 1   sertraline (ZOLOFT) 50 MG tablet TAKE 3 TABLETS BY MOUTH DAILY 270 tablet 3   SYNTHROID 88 MCG tablet TAKE 1 TABLET BY MOUTH ONCE DAILY 90 90 tablet 2   traMADol (ULTRAM) 50 MG tablet Take 1 tablet (50 mg total) by mouth every 6 (six) hours as needed. 30 tablet 0   Vitamin D, Ergocalciferol, (DRISDOL) 1.25 MG (50000 UNIT) CAPS capsule TAKE 1 CAPSULE BY MOUTH TWICE A WEEK 24 capsule 2   Vitamin D, Ergocalciferol, (DRISDOL) 1.25 MG (50000 UT) CAPS capsule       zolpidem (AMBIEN) 10 MG tablet TAKE 1 TABLET BY MOUTH AT BEDTIME 30 tablet 5   zolpidem (AMBIEN) 10 MG tablet TAKE 1 TABLET BY MOUTH AT BEDTIME 30 tablet 5   No current facility-administered medications on file prior to visit.    ALLERGIES: Allergies  Allergen Reactions   Gluten Meal Diarrhea and Other (See Comments)    Severe diarrhea   Other Other (See Comments)    Bleach-respiratory distress   Plavix [Clopidogrel Bisulfate] Itching, Rash and Other (See Comments)    Hands and feet    Protonix [Pantoprazole] Itching and Other (See Comments)    Whelps   Penicillins Itching, Rash and Other (See Comments)    Childhood allergy- specifics unknown; tolerated amoxicillin and ancef since then     FAMILY HISTORY: Family History  Problem Relation Age of Onset   Heart attack Father 13   Stroke Neg Hx    Breast cancer Neg Hx     SOCIAL HISTORY: Social History   Socioeconomic History   Marital status: Married    Spouse name: Not on file   Number of children: Not on file   Years of education: Not on file   Highest education level: Not on file  Occupational History   Not on file  Tobacco Use   Smoking status: Never   Smokeless tobacco: Never  Vaping Use   Vaping Use: Never used  Substance and Sexual Activity   Alcohol use: Yes    Comment: socially   Drug use: No   Sexual activity: Not on file  Other Topics Concern   Not on file  Social History Narrative   Lives in 1 story home with her husband and 2 sons   Has 2 adult sons   Has bach. Degree   Works as Designer, jewellery   Right handed   Social Determinants of [place] Strain: Not on file  Food Insecurity: Not on file  Transportation Needs: Not on file  Physical Activity: Not on file  Stress: Not on file  Social Connections: Not on file  Intimate Partner Violence: Not on file     PHYSICAL EXAM: Vitals:   08/20/20 1601  BP: (!) 144/71  Pulse: 63  SpO2: 97%  General: No acute  distress Head:  Normocephalic/atraumatic Skin/Extremities: No rash, no edema Neurological Exam: alert and oriented to person, place, and time. No aphasia or dysarthria. Fund of knowledge is appropriate.  Recent and remote memory are intact.  Attention and concentration are normal.   Cranial nerves: Left pupil slightly smaller than right (chronic). Extraocular movements intact with no nystagmus. Visual fields full.  No facial asymmetry.  Motor: Bulk and tone normal, muscle strength 5/5 throughout with no pronator drift.   Finger to nose testing intact.  Gait narrow-based and steady, able to tandem walk adequately.  Tremor today is very minimal.   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 She denies any seizures in over a year, she is on Keppra XR '2500mg'$  daily, Lamotrigine '100mg'$  BID and Depakote ER '500mg'$  qhs. She is also on Primidone for essential tremor. She has prn clonazepam for breakthrough seizures. Mood stable on Sertraline '150mg'$  daily. She has reduced Ambien intake to '5mg'$  every night with melatonin gummies. She had previously seen sleep specialist Dr. Rexene Alberts who had recommended a sleep study to rule out organic brain disorder potentially causing sleep difficulties, she would like to proceed with sleep study. She is aware of [place] driving laws to stop driving after a seizure until 6 months seizure-free. Follow-up in 8 months, she knows to call for any changes.    Thank you for allowing me to participate in her care.  Please do not hesitate to call for any questions or concerns.   Ellouise Newer, M.D.   CC: Dr. Forde Dandy

## 2020-08-23 ENCOUNTER — Other Ambulatory Visit (HOSPITAL_COMMUNITY): Payer: Self-pay

## 2020-08-24 ENCOUNTER — Telehealth: Payer: Self-pay

## 2020-08-24 DIAGNOSIS — E039 Hypothyroidism, unspecified: Secondary | ICD-10-CM | POA: Diagnosis not present

## 2020-08-24 DIAGNOSIS — E559 Vitamin D deficiency, unspecified: Secondary | ICD-10-CM | POA: Diagnosis not present

## 2020-08-24 DIAGNOSIS — E785 Hyperlipidemia, unspecified: Secondary | ICD-10-CM | POA: Diagnosis not present

## 2020-08-24 DIAGNOSIS — R7301 Impaired fasting glucose: Secondary | ICD-10-CM | POA: Diagnosis not present

## 2020-08-24 NOTE — Telephone Encounter (Signed)
Pt called an advised that she had not seen Dr Rexene Alberts in over a year, please  call [place] office at [phone] to schedule a follow-up appointment in sleep clinic. Pt verbalized understanding and stated she will call the office

## 2020-08-24 NOTE — Telephone Encounter (Signed)
-----   Message from Cameron Sprang, MD sent at 08/24/2020  3:54 PM EDT ----- Can you pls let patient know message below from Dr. Rexene Alberts?  Thanks! Santiago Glad  ----- Message ----- From: Star Age, MD Sent: 08/23/2020   5:28 PM EDT To: Cameron Sprang, MD  Salley Scarlet Santiago Glad, since she has not seen me in over a year, please ask her to call our office at [phone] to schedule a follow-up appointment in sleep clinic.  Thank you. sa ----- Message ----- From: Cameron Sprang, MD Sent: 08/22/2020   4:16 PM EDT To: Star Age, MD  Hi Saima! She has not seen you since May 2021 when you recommended a sleep study. She is interested in proceeding with test now, should she just call your office to schedule it or she needs to see you again? Thank you!

## 2020-08-29 MED FILL — Propranolol HCl Cap ER 24HR 60 MG: ORAL | 30 days supply | Qty: 30 | Fill #2 | Status: AC

## 2020-08-30 ENCOUNTER — Other Ambulatory Visit (HOSPITAL_COMMUNITY): Payer: Self-pay

## 2020-09-13 ENCOUNTER — Other Ambulatory Visit: Payer: Self-pay | Admitting: Endocrinology

## 2020-09-13 DIAGNOSIS — Z1231 Encounter for screening mammogram for malignant neoplasm of breast: Secondary | ICD-10-CM

## 2020-09-15 ENCOUNTER — Other Ambulatory Visit (HOSPITAL_COMMUNITY): Payer: Self-pay

## 2020-09-15 DIAGNOSIS — I700 Atherosclerosis of aorta: Secondary | ICD-10-CM | POA: Diagnosis not present

## 2020-09-15 DIAGNOSIS — I639 Cerebral infarction, unspecified: Secondary | ICD-10-CM | POA: Diagnosis not present

## 2020-09-15 DIAGNOSIS — Z0000 Encounter for general adult medical examination without abnormal findings: Secondary | ICD-10-CM | POA: Diagnosis not present

## 2020-09-15 DIAGNOSIS — D126 Benign neoplasm of colon, unspecified: Secondary | ICD-10-CM | POA: Diagnosis not present

## 2020-09-15 DIAGNOSIS — G250 Essential tremor: Secondary | ICD-10-CM | POA: Diagnosis not present

## 2020-09-15 DIAGNOSIS — E039 Hypothyroidism, unspecified: Secondary | ICD-10-CM | POA: Diagnosis not present

## 2020-09-15 DIAGNOSIS — M8580 Other specified disorders of bone density and structure, unspecified site: Secondary | ICD-10-CM | POA: Diagnosis not present

## 2020-09-15 DIAGNOSIS — R7301 Impaired fasting glucose: Secondary | ICD-10-CM | POA: Diagnosis not present

## 2020-09-15 DIAGNOSIS — R82998 Other abnormal findings in urine: Secondary | ICD-10-CM | POA: Diagnosis not present

## 2020-09-15 DIAGNOSIS — I10 Essential (primary) hypertension: Secondary | ICD-10-CM | POA: Diagnosis not present

## 2020-09-15 DIAGNOSIS — E785 Hyperlipidemia, unspecified: Secondary | ICD-10-CM | POA: Diagnosis not present

## 2020-09-15 MED ORDER — LEVOTHYROXINE SODIUM 100 MCG PO TABS
ORAL_TABLET | ORAL | 3 refills | Status: DC
  Filled 2020-09-15: qty 90, 90d supply, fill #0
  Filled 2020-12-19: qty 90, 90d supply, fill #1
  Filled 2021-03-13: qty 90, 90d supply, fill #2
  Filled 2021-06-16: qty 90, 90d supply, fill #3

## 2020-09-16 ENCOUNTER — Other Ambulatory Visit (HOSPITAL_COMMUNITY): Payer: Self-pay

## 2020-09-16 ENCOUNTER — Other Ambulatory Visit: Payer: Self-pay | Admitting: Endocrinology

## 2020-09-16 DIAGNOSIS — N644 Mastodynia: Secondary | ICD-10-CM

## 2020-09-20 ENCOUNTER — Other Ambulatory Visit (HOSPITAL_COMMUNITY): Payer: Self-pay

## 2020-09-23 DIAGNOSIS — Z86010 Personal history of colonic polyps: Secondary | ICD-10-CM | POA: Diagnosis not present

## 2020-09-23 DIAGNOSIS — K580 Irritable bowel syndrome with diarrhea: Secondary | ICD-10-CM | POA: Diagnosis not present

## 2020-09-28 ENCOUNTER — Other Ambulatory Visit (HOSPITAL_COMMUNITY): Payer: Self-pay

## 2020-10-04 ENCOUNTER — Other Ambulatory Visit (HOSPITAL_COMMUNITY): Payer: Self-pay

## 2020-10-04 MED FILL — Propranolol HCl Cap ER 24HR 60 MG: ORAL | 30 days supply | Qty: 30 | Fill #3 | Status: AC

## 2020-10-08 ENCOUNTER — Other Ambulatory Visit (HOSPITAL_COMMUNITY): Payer: Self-pay

## 2020-10-08 DIAGNOSIS — Z0100 Encounter for examination of eyes and vision without abnormal findings: Secondary | ICD-10-CM | POA: Diagnosis not present

## 2020-10-08 MED FILL — Ezetimibe Tab 10 MG: ORAL | 90 days supply | Qty: 90 | Fill #1 | Status: AC

## 2020-10-14 ENCOUNTER — Other Ambulatory Visit (HOSPITAL_COMMUNITY): Payer: Self-pay

## 2020-10-14 MED ORDER — PEG 3350-KCL-NA BICARB-NACL 420 G PO SOLR
ORAL | 0 refills | Status: DC
  Filled 2020-10-14: qty 4000, 1d supply, fill #0

## 2020-10-15 ENCOUNTER — Other Ambulatory Visit (HOSPITAL_COMMUNITY): Payer: Self-pay

## 2020-10-17 MED FILL — Ergocalciferol Cap 1.25 MG (50000 Unit): ORAL | 84 days supply | Qty: 24 | Fill #1 | Status: AC

## 2020-10-17 MED FILL — Amlodipine Besylate Tab 5 MG (Base Equivalent): ORAL | 90 days supply | Qty: 90 | Fill #1 | Status: AC

## 2020-10-18 ENCOUNTER — Other Ambulatory Visit (HOSPITAL_COMMUNITY): Payer: Self-pay

## 2020-10-18 DIAGNOSIS — D122 Benign neoplasm of ascending colon: Secondary | ICD-10-CM | POA: Diagnosis not present

## 2020-10-18 DIAGNOSIS — Z86010 Personal history of colonic polyps: Secondary | ICD-10-CM | POA: Diagnosis not present

## 2020-10-18 DIAGNOSIS — D123 Benign neoplasm of transverse colon: Secondary | ICD-10-CM | POA: Diagnosis not present

## 2020-10-18 DIAGNOSIS — K6289 Other specified diseases of anus and rectum: Secondary | ICD-10-CM | POA: Diagnosis not present

## 2020-10-26 ENCOUNTER — Ambulatory Visit
Admission: RE | Admit: 2020-10-26 | Discharge: 2020-10-26 | Disposition: A | Discharge status: Home / Self Care | Payer: 59 | Source: Ambulatory Visit | Attending: Endocrinology | Admitting: Endocrinology

## 2020-10-26 ENCOUNTER — Ambulatory Visit: Payer: 59

## 2020-10-26 ENCOUNTER — Other Ambulatory Visit: Payer: Self-pay

## 2020-10-26 DIAGNOSIS — N644 Mastodynia: Secondary | ICD-10-CM

## 2020-10-26 DIAGNOSIS — R922 Inconclusive mammogram: Secondary | ICD-10-CM | POA: Diagnosis not present

## 2020-10-27 ENCOUNTER — Other Ambulatory Visit (HOSPITAL_COMMUNITY): Payer: Self-pay

## 2020-10-28 ENCOUNTER — Other Ambulatory Visit: Payer: 59

## 2020-10-29 ENCOUNTER — Other Ambulatory Visit (HOSPITAL_COMMUNITY): Payer: Self-pay

## 2020-11-05 ENCOUNTER — Other Ambulatory Visit (HOSPITAL_COMMUNITY): Payer: Self-pay

## 2020-11-05 MED FILL — Propranolol HCl Cap ER 24HR 60 MG: ORAL | 30 days supply | Qty: 30 | Fill #4 | Status: AC

## 2020-11-17 ENCOUNTER — Other Ambulatory Visit (HOSPITAL_COMMUNITY): Payer: Self-pay

## 2020-11-26 ENCOUNTER — Other Ambulatory Visit (HOSPITAL_COMMUNITY): Payer: Self-pay

## 2020-11-26 MED ORDER — DIVALPROEX SODIUM ER 250 MG PO TB24
ORAL_TABLET | ORAL | 3 refills | Status: DC
  Filled 2020-11-26: qty 90, 90d supply, fill #0

## 2020-11-27 ENCOUNTER — Other Ambulatory Visit (HOSPITAL_COMMUNITY): Payer: Self-pay

## 2020-11-29 ENCOUNTER — Other Ambulatory Visit (HOSPITAL_COMMUNITY): Payer: Self-pay

## 2020-12-01 ENCOUNTER — Other Ambulatory Visit (HOSPITAL_COMMUNITY): Payer: Self-pay

## 2020-12-02 ENCOUNTER — Other Ambulatory Visit (HOSPITAL_COMMUNITY): Payer: Self-pay

## 2020-12-04 ENCOUNTER — Other Ambulatory Visit (HOSPITAL_COMMUNITY): Payer: Self-pay

## 2020-12-05 ENCOUNTER — Other Ambulatory Visit (HOSPITAL_COMMUNITY): Payer: Self-pay

## 2020-12-06 ENCOUNTER — Other Ambulatory Visit (HOSPITAL_COMMUNITY): Payer: Self-pay

## 2020-12-07 ENCOUNTER — Other Ambulatory Visit (HOSPITAL_COMMUNITY): Payer: Self-pay

## 2020-12-07 MED ORDER — PRIMIDONE 50 MG PO TABS
ORAL_TABLET | ORAL | 5 refills | Status: DC
  Filled 2020-12-07: qty 150, 30d supply, fill #0
  Filled 2021-01-07: qty 150, 30d supply, fill #1
  Filled 2021-02-05 – 2021-02-11 (×2): qty 150, 30d supply, fill #2
  Filled 2021-03-13: qty 150, 30d supply, fill #3
  Filled 2021-04-17: qty 150, 30d supply, fill #4
  Filled 2021-05-16: qty 150, 30d supply, fill #5

## 2020-12-09 ENCOUNTER — Other Ambulatory Visit (HOSPITAL_COMMUNITY): Payer: Self-pay

## 2020-12-09 DIAGNOSIS — Z9682 Presence of neurostimulator: Secondary | ICD-10-CM | POA: Diagnosis not present

## 2020-12-09 DIAGNOSIS — G250 Essential tremor: Secondary | ICD-10-CM | POA: Diagnosis not present

## 2020-12-10 ENCOUNTER — Other Ambulatory Visit (HOSPITAL_COMMUNITY): Payer: Self-pay

## 2020-12-10 MED ORDER — PROPRANOLOL HCL ER 60 MG PO CP24
60.0000 mg | ORAL_CAPSULE | Freq: Every day | ORAL | 11 refills | Status: AC
  Filled 2020-12-10: qty 30, 30d supply, fill #0
  Filled 2021-01-07: qty 30, 30d supply, fill #1
  Filled 2021-02-05: qty 30, 30d supply, fill #2
  Filled 2021-03-13: qty 30, 30d supply, fill #3
  Filled 2021-04-17: qty 30, 30d supply, fill #4
  Filled 2021-05-16: qty 30, 30d supply, fill #5
  Filled 2021-06-16: qty 30, 30d supply, fill #6
  Filled 2021-07-14: qty 30, 30d supply, fill #7
  Filled 2021-08-11: qty 30, 30d supply, fill #8
  Filled 2021-09-15: qty 30, 30d supply, fill #9

## 2020-12-19 ENCOUNTER — Other Ambulatory Visit (HOSPITAL_COMMUNITY): Payer: Self-pay

## 2020-12-20 ENCOUNTER — Other Ambulatory Visit (HOSPITAL_COMMUNITY): Payer: Self-pay

## 2021-01-07 ENCOUNTER — Other Ambulatory Visit (HOSPITAL_COMMUNITY): Payer: Self-pay

## 2021-01-10 ENCOUNTER — Other Ambulatory Visit (HOSPITAL_COMMUNITY): Payer: Self-pay

## 2021-01-10 MED ORDER — AMLODIPINE BESYLATE 5 MG PO TABS
5.0000 mg | ORAL_TABLET | Freq: Every day | ORAL | 3 refills | Status: DC
  Filled 2021-01-10: qty 90, 90d supply, fill #0
  Filled 2021-04-25: qty 90, 90d supply, fill #1
  Filled 2021-07-26: qty 90, 90d supply, fill #2
  Filled 2021-10-22: qty 90, 90d supply, fill #3

## 2021-01-10 MED ORDER — ATORVASTATIN CALCIUM 40 MG PO TABS
40.0000 mg | ORAL_TABLET | Freq: Every day | ORAL | 2 refills | Status: DC
  Filled 2021-01-10: qty 90, 90d supply, fill #0
  Filled 2021-03-15: qty 90, 90d supply, fill #1
  Filled 2021-07-14: qty 90, 90d supply, fill #2

## 2021-01-10 MED ORDER — EZETIMIBE 10 MG PO TABS
10.0000 mg | ORAL_TABLET | Freq: Every day | ORAL | 2 refills | Status: DC
  Filled 2021-01-10: qty 90, 90d supply, fill #0
  Filled 2021-01-20 – 2021-04-25 (×2): qty 90, 90d supply, fill #1
  Filled 2021-07-26: qty 90, 90d supply, fill #2

## 2021-01-18 ENCOUNTER — Other Ambulatory Visit (HOSPITAL_COMMUNITY): Payer: Self-pay

## 2021-01-20 ENCOUNTER — Other Ambulatory Visit (HOSPITAL_COMMUNITY): Payer: Self-pay

## 2021-01-26 ENCOUNTER — Encounter: Payer: Self-pay | Admitting: Neurology

## 2021-02-07 ENCOUNTER — Other Ambulatory Visit (HOSPITAL_COMMUNITY): Payer: Self-pay

## 2021-02-10 ENCOUNTER — Other Ambulatory Visit (HOSPITAL_COMMUNITY): Payer: Self-pay

## 2021-02-11 ENCOUNTER — Other Ambulatory Visit (HOSPITAL_COMMUNITY): Payer: Self-pay

## 2021-02-11 MED ORDER — PREGABALIN 50 MG PO CAPS
50.0000 mg | ORAL_CAPSULE | Freq: Two times a day (BID) | ORAL | 0 refills | Status: DC | PRN
  Filled 2021-02-11: qty 60, 30d supply, fill #0

## 2021-02-11 MED ORDER — FAMCICLOVIR 500 MG PO TABS
ORAL_TABLET | ORAL | 0 refills | Status: DC
  Filled 2021-02-11: qty 12, 4d supply, fill #0
  Filled 2021-02-11: qty 9, 3d supply, fill #0

## 2021-02-14 ENCOUNTER — Other Ambulatory Visit (HOSPITAL_COMMUNITY): Payer: Self-pay

## 2021-02-15 ENCOUNTER — Other Ambulatory Visit (HOSPITAL_COMMUNITY): Payer: Self-pay

## 2021-03-04 ENCOUNTER — Other Ambulatory Visit (HOSPITAL_COMMUNITY): Payer: Self-pay

## 2021-03-07 ENCOUNTER — Other Ambulatory Visit (HOSPITAL_COMMUNITY): Payer: Self-pay

## 2021-03-13 ENCOUNTER — Other Ambulatory Visit: Payer: Self-pay | Admitting: Neurology

## 2021-03-13 ENCOUNTER — Other Ambulatory Visit (HOSPITAL_COMMUNITY): Payer: Self-pay

## 2021-03-14 ENCOUNTER — Other Ambulatory Visit (HOSPITAL_COMMUNITY): Payer: Self-pay

## 2021-03-14 ENCOUNTER — Other Ambulatory Visit: Payer: Self-pay

## 2021-03-14 MED ORDER — ZOLPIDEM TARTRATE 5 MG PO TABS: 5.0000 mg | ORAL_TABLET | Freq: Every day | ORAL | 0 refills | Status: DC

## 2021-03-14 MED ORDER — ZOLPIDEM TARTRATE 5 MG PO TABS
ORAL_TABLET | ORAL | 0 refills | Status: DC
  Filled 2021-03-14: qty 30, 30d supply, fill #0

## 2021-03-15 ENCOUNTER — Other Ambulatory Visit (HOSPITAL_COMMUNITY): Payer: Self-pay

## 2021-03-15 MED ORDER — LAGEVRIO 200 MG PO CAPS
ORAL_CAPSULE | ORAL | 0 refills | Status: DC
  Filled 2021-03-15: qty 40, 5d supply, fill #0

## 2021-03-16 ENCOUNTER — Other Ambulatory Visit (HOSPITAL_COMMUNITY): Payer: Self-pay

## 2021-03-23 ENCOUNTER — Other Ambulatory Visit (HOSPITAL_COMMUNITY): Payer: Self-pay

## 2021-03-29 DIAGNOSIS — Z4542 Encounter for adjustment and management of neuropacemaker (brain) (peripheral nerve) (spinal cord): Secondary | ICD-10-CM | POA: Diagnosis not present

## 2021-03-29 DIAGNOSIS — Z9689 Presence of other specified functional implants: Secondary | ICD-10-CM | POA: Diagnosis not present

## 2021-03-29 DIAGNOSIS — R1310 Dysphagia, unspecified: Secondary | ICD-10-CM | POA: Diagnosis not present

## 2021-03-29 DIAGNOSIS — G250 Essential tremor: Secondary | ICD-10-CM | POA: Diagnosis not present

## 2021-03-31 ENCOUNTER — Other Ambulatory Visit (HOSPITAL_COMMUNITY): Payer: Self-pay

## 2021-03-31 MED ORDER — PREDNISONE 10 MG PO TABS
ORAL_TABLET | ORAL | 0 refills | Status: DC
  Filled 2021-03-31: qty 3, 3d supply, fill #0

## 2021-03-31 MED ORDER — ALBUTEROL SULFATE HFA 108 (90 BASE) MCG/ACT IN AERS
INHALATION_SPRAY | RESPIRATORY_TRACT | 0 refills | Status: DC
  Filled 2021-03-31: qty 18, 30d supply, fill #0

## 2021-04-10 ENCOUNTER — Other Ambulatory Visit (HOSPITAL_COMMUNITY): Payer: Self-pay

## 2021-04-11 ENCOUNTER — Other Ambulatory Visit (HOSPITAL_COMMUNITY): Payer: Self-pay

## 2021-04-11 ENCOUNTER — Other Ambulatory Visit: Payer: Self-pay | Admitting: Neurology

## 2021-04-11 MED ORDER — PREDNISONE 10 MG PO TABS
ORAL_TABLET | ORAL | 0 refills | Status: DC
  Filled 2021-04-11: qty 3, 3d supply, fill #0

## 2021-04-11 MED ORDER — ZOLPIDEM TARTRATE 5 MG PO TABS
ORAL_TABLET | ORAL | 0 refills | Status: DC
  Filled 2021-04-11: qty 30, 30d supply, fill #0

## 2021-04-14 DIAGNOSIS — I10 Essential (primary) hypertension: Secondary | ICD-10-CM | POA: Diagnosis not present

## 2021-04-14 DIAGNOSIS — M8580 Other specified disorders of bone density and structure, unspecified site: Secondary | ICD-10-CM | POA: Diagnosis not present

## 2021-04-14 DIAGNOSIS — Z8673 Personal history of transient ischemic attack (TIA), and cerebral infarction without residual deficits: Secondary | ICD-10-CM | POA: Diagnosis not present

## 2021-04-14 DIAGNOSIS — E559 Vitamin D deficiency, unspecified: Secondary | ICD-10-CM | POA: Diagnosis not present

## 2021-04-14 DIAGNOSIS — I700 Atherosclerosis of aorta: Secondary | ICD-10-CM | POA: Diagnosis not present

## 2021-04-14 DIAGNOSIS — R7301 Impaired fasting glucose: Secondary | ICD-10-CM | POA: Diagnosis not present

## 2021-04-14 DIAGNOSIS — G250 Essential tremor: Secondary | ICD-10-CM | POA: Diagnosis not present

## 2021-04-14 DIAGNOSIS — R569 Unspecified convulsions: Secondary | ICD-10-CM | POA: Diagnosis not present

## 2021-04-14 DIAGNOSIS — E039 Hypothyroidism, unspecified: Secondary | ICD-10-CM | POA: Diagnosis not present

## 2021-04-17 ENCOUNTER — Other Ambulatory Visit (HOSPITAL_COMMUNITY): Payer: Self-pay

## 2021-04-18 ENCOUNTER — Other Ambulatory Visit (HOSPITAL_COMMUNITY): Payer: Self-pay

## 2021-04-19 ENCOUNTER — Other Ambulatory Visit (HOSPITAL_COMMUNITY): Payer: Self-pay

## 2021-04-20 ENCOUNTER — Other Ambulatory Visit (HOSPITAL_COMMUNITY): Payer: Self-pay

## 2021-04-20 ENCOUNTER — Encounter: Payer: Self-pay | Admitting: Neurology

## 2021-04-20 ENCOUNTER — Ambulatory Visit: Payer: 59 | Admitting: Neurology

## 2021-04-20 VITALS — BP 136/72 | HR 59 | Ht 62.0 in | Wt 170.4 lb

## 2021-04-20 DIAGNOSIS — G4700 Insomnia, unspecified: Secondary | ICD-10-CM

## 2021-04-20 DIAGNOSIS — G40309 Generalized idiopathic epilepsy and epileptic syndromes, not intractable, without status epilepticus: Secondary | ICD-10-CM

## 2021-04-20 MED ORDER — CLONAZEPAM 0.5 MG PO TABS
ORAL_TABLET | ORAL | 5 refills | Status: DC
  Filled 2021-04-20: qty 10, 30d supply, fill #0
  Filled 2021-06-07: qty 10, 30d supply, fill #1
  Filled 2021-07-01 – 2021-07-14 (×2): qty 10, 30d supply, fill #2
  Filled 2021-08-11: qty 10, 30d supply, fill #3
  Filled 2021-09-15: qty 10, 30d supply, fill #4

## 2021-04-20 MED ORDER — ZOLPIDEM TARTRATE 5 MG PO TABS
5.0000 mg | ORAL_TABLET | Freq: Every day | ORAL | 5 refills | Status: DC
  Filled 2021-04-20 – 2021-05-13 (×2): qty 30, 30d supply, fill #0
  Filled 2021-06-13: qty 30, 30d supply, fill #1
  Filled 2021-06-25 – 2021-07-14 (×2): qty 30, 30d supply, fill #2
  Filled 2021-08-11: qty 30, 30d supply, fill #3
  Filled 2021-09-05: qty 30, 30d supply, fill #4
  Filled 2021-10-12: qty 30, 30d supply, fill #5

## 2021-04-20 MED ORDER — LEVETIRACETAM ER 500 MG PO TB24
ORAL_TABLET | ORAL | 3 refills | Status: DC
  Filled 2021-04-20: qty 450, fill #0
  Filled 2021-06-07: qty 450, 90d supply, fill #0
  Filled 2021-09-05: qty 450, 90d supply, fill #1
  Filled 2021-11-09 – 2021-11-17 (×4): qty 450, 90d supply, fill #2

## 2021-04-20 MED ORDER — LAMOTRIGINE 100 MG PO TABS
ORAL_TABLET | ORAL | 3 refills | Status: DC
  Filled 2021-04-20 – 2021-04-30 (×2): qty 180, 90d supply, fill #0
  Filled 2021-07-26: qty 180, 90d supply, fill #1
  Filled 2021-08-18 – 2021-10-22 (×2): qty 180, 90d supply, fill #2
  Filled 2021-11-09 – 2021-11-11 (×2): qty 180, 90d supply, fill #3

## 2021-04-20 NOTE — Progress Notes (Signed)
? ?NEUROLOGY FOLLOW UP OFFICE NOTE ? ?Alyssa Mccarthy ?[id number] ?[birth year] ? ?HISTORY OF PRESENT ILLNESS: ?I had the pleasure of seeing Alyssa Mccarthy in follow-up in the neurology clinic on 04/20/2021.  The patient was last seen 8 months ago for seizures. She is alone in the office today.  Records and images were personally reviewed where available.  She contacted our office in November about difficulty with weight loss possibly due to Depakote, which was weaned off in January 2023. She continues on Levetiracetam ER '2500mg'$  daily, Lamictal '100mg'$  BID, and Primidone '100mg'$  in AM, '150mg'$  in PM for ET. She has prn clonazepam for times she has an overwhelming sense of sleepiness.She denies any seizures since May 2021, when she reported double vision, slowed speech, stumbling, and a hard time remembering things at work. She has taken the clonazepam twice to help her sleep. She continues to take Ambien '5mg'$  qhs and had increased melatonin to 10-'15mg'$  qhs. She has not been back to see sleep specialist Dr. Rexene Alberts or done the sleep study. She reports doing well, no staring/unresponsive episodes, gaps in time, olfactory/gustatory hallucinations, focal numbness/tingling/weakness, myoclonic jerks. Her husband notes she is a lot more alert, and she is faster at work since stopping the Depakote. She started the keto diet and has lost weight. She sees Dr. [name] for ET s/p DBS and has a swallow evaluation ordered, she has choked a few times. Speech therapy helped with dysarthria.  ? ? ?History on Initial Assessment 01/29/2017: This is a pleasant 56 yo RH woman with a history of idiopathic generalized epilepsy, essential tremor s/p VIM DBS, presenting to establish care. She had been seeing epileptologist Dr. [name] at [facility], records were reviewed. She sees Movement Disorder specialist Dr. [name] for the essential tremor. Seizures started around age 41. She reports seizures were well-controlled for 25 years, then after she had her daughter, she was  having more seizures for 3-4 years, then overall has been well-controlled the past 10 years. She has no clear aura, describes body jerking that progresses to a generalized convulsion. Flashing lights and sleep deprivation are seizure triggers. She has not had any seizures since around 2015 when Depakote was re-added to her regimen. She is currently on Lamictal '100mg'$  BID, Keppra XR '2500mg'$  daily, and Depakote ER '500mg'$  daily with no side effects. She had been on Depakote for many years in the past but had weight gain, it was stopped, then restarted around 4 years ago. She is also on Primidone for the tremor. She denies any olfactory/gustatory hallucinations, deja vu, rising epigastric sensation, focal numbness/tingling/weakness, myoclonic jerks. She rarely has a funny feeling that "something is not quite right" and would take a prn clonazepam as a precaution, she has not needed this in about a year.  ?  ?She denies any headaches, dizziness, diplopia, dysarthria/dysphagia, neck/back pain, bowel/bladder dysfunction. Sleep is good. She had been taking Zoloft increased dose 4 years ago due to irritability, improved with higher dose. She was diagnosed with a stroke in January 2018 when she had a bad headache and anisocoria. MRI brain showed subtle T2 hyperintensity in the pons, R>L. She saw Dr. Sanda Mccarthy who confirmed a left Horner's. She has chronic mild right-sided sensory changes since then. She has also had symptoms every once in a while where there is something she can't see (not clearly on peripheral vision), she blinks and vision is back to normal. She has chronic numbness on the left elbow region. She has noticed she has no interest in  eating, but makes herself eat. 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  ?  ?Diagnostic Data: ?MRI brain in March 2011 reported as normal. ?EEG in March 2010 abnormal due to generalized polyspike and wave, consistent with a primary generalized epilepsy ?Prior  AEDs: Phenobarbital, Zonisamide, Dilantin. On a different generic Levetiracetam, she had dry hands and peeling in fingertips, which resolved after switching to a different generic ?  ?Epilepsy Risk Factors:  There is a strong family history of seizures in her paternal grandfather, father, and daughter. Otherwise she had a normal birth and early development.  There is no history of febrile convulsions, CNS infections such as meningitis/encephalitis, significant traumatic brain injury, neurosurgical procedures. ? ? ?PAST MEDICAL HISTORY: ?Past Medical History:  ?Diagnosis Date  ? Complication of anesthesia   ? Essential tremor   ? Hyperlipidemia   ? Hypothyroidism   ? PONV (postoperative nausea and vomiting)   ? Seizure disorder ([place])   ? Stroke [facility])   ? ? ?MEDICATIONS: ?Current Outpatient Medications on File Prior to Visit  ?Medication Sig Dispense Refill  ? albuterol (VENTOLIN HFA) 108 (90 Base) MCG/ACT inhaler Inhale 2 puffs into the lungs 2 times a day for 4 days as needed then use 2 puffs 2 times a day as needed then wean after that 18 g 0  ? amLODipine (NORVASC) 5 MG tablet TAKE 1 TABLET BY MOUTH ONCE DAILY 90 tablet 3  ? amLODipine (NORVASC) 5 MG tablet TAKE 1 TABLET BY MOUTH ONCE DAILY 90 tablet 3  ? aspirin EC 325 MG EC tablet Take 1 tablet (325 mg total) by mouth daily. 30 tablet 1  ? atorvastatin (LIPITOR) 40 MG tablet TAKE 1 TABLET BY MOUTH DAILY 90 tablet 2  ? atorvastatin (LIPITOR) 40 MG tablet TAKE 1 TABLET BY MOUTH ONCE DAILY 90 tablet 2  ? clobetasol ointment (TEMOVATE) 0.05 %     ? clonazePAM (KLONOPIN) 0.5 MG tablet TAKE 1 TABLET BY MOUTH ONCE A DAY AS NEEDED FOR SEIZURE (DONT USE MORE THAN 2-3 A WEEK) 10 tablet 5  ? divalproex (DEPAKOTE ER) 250 MG 24 hr tablet Take 1 tablet by mouth every night 90 tablet 3  ? Estradiol 10 MCG TABS vaginal tablet Vaginal every 2 weeks    ? ezetimibe (ZETIA) 10 MG tablet Take 10 mg by mouth daily.     ? ezetimibe (ZETIA) 10 MG tablet TAKE 1 TABLET BY MOUTH ONCE A  DAY 90 tablet 2  ? famciclovir (FAMVIR) 500 MG tablet Take 1 tablet by mouth 3 times a day for 1 week 21 tablet 0  ? lamoTRIgine (LAMICTAL) 100 MG tablet TAKE 1 TABLET BY MOUTH TWO TIMES DAILY 180 tablet 3  ? levETIRAcetam (KEPPRA XR) 500 MG 24 hr tablet Take 5 tablets by mouth daily 450 tablet 3  ? levothyroxine (SYNTHROID) 100 MCG tablet TAKE 1 TABLET BY MOUTH ONCE DAILY 90 tablet 3  ? levothyroxine (SYNTHROID, LEVOTHROID) 88 MCG tablet Take 88 mcg by mouth daily before breakfast.    ? Melatonin 5 MG TABS Take by mouth as needed (sleep). 10-20 mg for sleep    ? meloxicam (MOBIC) 15 MG tablet Take 1 tablet(s) by mouth twice daily for 5 days, then take one tablet by mouth daily as needed (Patient not taking: Reported on 08/20/2020) 60 tablet 1  ? metoprolol succinate (TOPROL-XL) 25 MG 24 hr tablet Take 25 mg by mouth daily.    ? molnupiravir EUA (LAGEVRIO) 200 MG CAPS  capsule Take 4 capsules by mouth every 12 hours for 5 days 40 capsule 0  ? polyethylene glycol-electrolytes (NULYTELY) 420 g solution Use as directed 4000 mL 0  ? predniSONE (DELTASONE) 10 MG tablet Take 1 tablet by mouth daily for 3 days as directed 3 tablet 0  ? pregabalin (LYRICA) 50 MG capsule Take 1 capsule (50 mg total) by mouth 2 (two) times daily as needed. 60 capsule 0  ? primidone (MYSOLINE) 50 MG tablet TAKE 2 TABLETS BY MOUTH EVERY MORNING AND 3 TABLETS IN THE EVENING 150 tablet 5  ? propranolol ER (INDERAL LA) 60 MG 24 hr capsule TAKE 1 CAPSULE BY MOUTH ONCE DAILY 30 capsule 11  ? propranolol ER (INDERAL LA) 60 MG 24 hr capsule TAKE 1 CAPSULE BY MOUTH ONCE DAILY 30 capsule 11  ? sertraline (ZOLOFT) 50 MG tablet TAKE 3 TABLETS BY MOUTH DAILY 270 tablet 3  ? SYNTHROID 88 MCG tablet TAKE 1 TABLET BY MOUTH ONCE DAILY 90 90 tablet 2  ? zolpidem (AMBIEN) 5 MG tablet Take 1 tablet (5 mg total) by mouth at bedtime. 30 tablet 0  ? zolpidem (AMBIEN) 5 MG tablet Take 1 tablet by mouth at bedtime 30 tablet 0  ? ?No current facility-administered  medications on file prior to visit.  ? ? ?ALLERGIES: ?Allergies  ?Allergen Reactions  ? Gluten Meal Diarrhea and Other (See Comments)  ?  Severe diarrhea  ? Other Other (See Comments)  ?  Bleach-respiratory di

## 2021-04-20 NOTE — Patient Instructions (Signed)
Good to see you doing well. Continue all your medications. I will check with Dr. Rexene Alberts for the sleep study. Follow-up in 8 months, call for any changes. ? ? ?Seizure Precautions: ?1. If medication has been prescribed for you to prevent seizures, take it exactly as directed.  Do not stop taking the medicine without talking to your doctor first, even if you have not had a seizure in a long time.  ? ?2. Avoid activities in which a seizure would cause danger to yourself or to others.  Don't operate dangerous machinery, swim alone, or climb in high or dangerous places, such as on ladders, roofs, or girders.  Do not drive unless your doctor says you may. ? ?3. If you have any warning that you may have a seizure, lay down in a safe place where you can't hurt yourself.   ? ?4.  No driving for 6 months from last seizure, as per [facility].   Please refer to the following link on the [employer] website for more information: http://[email or web address]/[email or web address]/[email or web address]  ? ?5.  Maintain good sleep hygiene. Avoid alcohol. ? ?6.  Contact your doctor if you have any problems that may be related to the medicine you are taking. ? ?7.  Call 911 and bring the patient back to the ED if: ?      ? A.  The seizure lasts longer than 5 minutes.      ? B.  The patient doesn't awaken shortly after the seizure ? C.  The patient has new problems such as difficulty seeing, speaking or moving ? D.  The patient was injured during the seizure ? E.  The patient has a temperature over 102 F (39C) ? F.  The patient vomited and now is having trouble breathing ?      ? ?

## 2021-04-21 ENCOUNTER — Other Ambulatory Visit (HOSPITAL_COMMUNITY): Payer: Self-pay

## 2021-04-22 ENCOUNTER — Telehealth: Payer: Self-pay

## 2021-04-22 NOTE — Telephone Encounter (Signed)
Pt called an informed Dr Delice Lesch contacted Dr. Rexene Alberts, she needs to see Alyssa Mccarthy first in the office again, pls have her call [facility] to make another appointment, last visit was in 2021 ?

## 2021-04-22 NOTE — Telephone Encounter (Signed)
-----   Message from Cameron Sprang, MD sent at 04/22/2021  8:34 AM EDT ----- ?Regarding: sleep specialist ?Pls let patient know I contacted Dr. Rexene Alberts, she needs to see Ms Masterson first in the office again, pls have her call [facility] to make another appointment, last visit was in 2021, thanks ? ?

## 2021-04-25 ENCOUNTER — Other Ambulatory Visit (HOSPITAL_COMMUNITY): Payer: Self-pay

## 2021-04-28 ENCOUNTER — Other Ambulatory Visit (HOSPITAL_COMMUNITY): Payer: Self-pay | Admitting: *Deleted

## 2021-04-28 DIAGNOSIS — R1310 Dysphagia, unspecified: Secondary | ICD-10-CM

## 2021-04-30 ENCOUNTER — Other Ambulatory Visit (HOSPITAL_COMMUNITY): Payer: Self-pay

## 2021-05-03 ENCOUNTER — Ambulatory Visit (HOSPITAL_COMMUNITY)
Admission: RE | Admit: 2021-05-03 | Discharge: 2021-05-03 | Disposition: A | Discharge status: Home / Self Care | Payer: 59 | Source: Ambulatory Visit | Attending: Neurology | Admitting: Neurology

## 2021-05-03 DIAGNOSIS — R1310 Dysphagia, unspecified: Secondary | ICD-10-CM | POA: Diagnosis not present

## 2021-05-03 NOTE — Progress Notes (Signed)
Objective Swallowing Evaluation: Type of Study: MBS-Modified Barium Swallow Study ?  ?Patient Details  ?Name: Alyssa Mccarthy ?MRN: [id number] ?Date of Birth: 1965/03/04 ? ?Today's Date: 05/03/2021 ?Time: SLP Start Time (ACUTE ONLY): 7829 ?-SLP Stop Time (ACUTE ONLY): 1125 ? ?SLP Time Calculation (min) (ACUTE ONLY): 20 min ? ? ?Past Medical History:  ?Past Medical History:  ?Diagnosis Date  ? Complication of anesthesia   ? Essential tremor   ? Hyperlipidemia   ? Hypothyroidism   ? PONV (postoperative nausea and vomiting)   ? Seizure disorder ([place])   ? Stroke [facility])   ? ?Past Surgical History:  ?Past Surgical History:  ?Procedure Laterality Date  ? ABDOMINAL HYSTERECTOMY    ? APPENDECTOMY    ? brain stimulator implant  02/2018  ? replacement   ? BRAIN SURGERY  2015  ? deep brain stimulator implants bilaterally  ? CARPAL TUNNEL RELEASE    ? PARTIAL PROCTECTOMY BY TEM N/A 12/04/2017  ? Procedure: PARTIAL PROCTECTOMY REMOVAL OF RECTAL MASS BY TEM ERAS PATHWAY;  Surgeon: Michael Boston, MD;  Location: [facility];  Service: General;  Laterality: N/A;  ? TOTAL KNEE ARTHROPLASTY    ? 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 She has been seizure-free since 2021. She was referred for an OP MBS due to occasional choking/coughing incidents when eating, transient sensation of lodging of food in throat. ?  ?Subjective: good historian ? ? ? Recommendations for follow up therapy are one component of a multi-disciplinary discharge planning process, led by the attending physician.  Recommendations may be updated based on patient status, additional functional criteria and insurance authorization. ? ?Assessment / Plan / Recommendation ? ? ?  05/03/2021  ?  1:00 PM  ?Clinical Impressions  ?Clinical Impression Pt presents with normal oropharyngeal swallow c/b normal bolus cohesion and fast propulsion through oral and  pharyngeal cavities; normal timing of swallow onset with reliable laryngeal vestibule closure; no penetration nor aspiration; adequate pharyngeal stripping with no residue post swallow.  Alyssa Mccarthy watched video in real time and we discussed results.  No recommendations nor f/u.  She agrees.  ?SLP Visit Diagnosis Dysphagia, unspecified (R13.10)  ?Impact on safety and function No limitations  ? ?   ?   ? View : No data to display.  ?  ?  ?  ?   ?   ? View : No data to display.  ?  ?  ?  ? ? ? ?  05/03/2021  ?  1:00 PM  ?Diet Recommendations  ?SLP Diet Recommendations Regular solids;Thin liquid  ?   ? ? ?  05/03/2021  ?  1:00 PM  ?Other Recommendations  ?Oral Care Recommendations Oral care BID  ?Follow Up Recommendations No SLP follow up  ? ? ?   ? View : No data to display.  ?  ?  ?  ?   ? ? ?  05/03/2021  ?  1:00 PM  ?Oral Phase  ?Oral Phase WFL  ?  ? ?  05/03/2021  ?  1:00 PM  ?Pharyngeal Phase  ?Pharyngeal Phase WFL  ?  ? ? ?  05/03/2021  ?  1:00 PM  ?Cervical Esophageal Phase   ?Cervical Esophageal Phase WFL  ? ? ? ?Alyssa Mccarthy ?05/03/2021, 1:59 PM ?  ? ?   ?   ?   ?   ?   ? ?  ? ?  ?  ?   ?  ? ? ?  ? ? ?

## 2021-05-12 DIAGNOSIS — L821 Other seborrheic keratosis: Secondary | ICD-10-CM | POA: Diagnosis not present

## 2021-05-12 DIAGNOSIS — D2239 Melanocytic nevi of other parts of face: Secondary | ICD-10-CM | POA: Diagnosis not present

## 2021-05-12 DIAGNOSIS — L812 Freckles: Secondary | ICD-10-CM | POA: Diagnosis not present

## 2021-05-12 DIAGNOSIS — D225 Melanocytic nevi of trunk: Secondary | ICD-10-CM | POA: Diagnosis not present

## 2021-05-12 DIAGNOSIS — D1801 Hemangioma of skin and subcutaneous tissue: Secondary | ICD-10-CM | POA: Diagnosis not present

## 2021-05-13 ENCOUNTER — Other Ambulatory Visit (HOSPITAL_COMMUNITY): Payer: Self-pay

## 2021-05-17 ENCOUNTER — Other Ambulatory Visit (HOSPITAL_COMMUNITY): Payer: Self-pay

## 2021-06-07 ENCOUNTER — Other Ambulatory Visit (HOSPITAL_COMMUNITY): Payer: Self-pay

## 2021-06-08 ENCOUNTER — Other Ambulatory Visit (HOSPITAL_COMMUNITY): Payer: Self-pay

## 2021-06-09 ENCOUNTER — Other Ambulatory Visit (HOSPITAL_COMMUNITY): Payer: Self-pay

## 2021-06-14 ENCOUNTER — Other Ambulatory Visit (HOSPITAL_COMMUNITY): Payer: Self-pay

## 2021-06-16 ENCOUNTER — Other Ambulatory Visit (HOSPITAL_COMMUNITY): Payer: Self-pay

## 2021-06-17 ENCOUNTER — Other Ambulatory Visit (HOSPITAL_COMMUNITY): Payer: Self-pay

## 2021-06-17 MED ORDER — PRIMIDONE 50 MG PO TABS
ORAL_TABLET | ORAL | 5 refills | Status: DC
  Filled 2021-06-17: qty 150, 30d supply, fill #0
  Filled 2021-07-14: qty 150, 30d supply, fill #1
  Filled 2021-08-11: qty 150, 30d supply, fill #2
  Filled 2021-09-15: qty 150, 30d supply, fill #3
  Filled 2021-10-22 – 2022-01-08 (×5): qty 150, 30d supply, fill #4
  Filled 2022-02-21: qty 150, 30d supply, fill #5

## 2021-06-27 ENCOUNTER — Other Ambulatory Visit (HOSPITAL_COMMUNITY): Payer: Self-pay

## 2021-06-28 ENCOUNTER — Other Ambulatory Visit (HOSPITAL_COMMUNITY): Payer: Self-pay

## 2021-07-01 ENCOUNTER — Encounter: Payer: Self-pay | Admitting: Neurology

## 2021-07-01 ENCOUNTER — Other Ambulatory Visit (HOSPITAL_COMMUNITY): Payer: Self-pay

## 2021-07-14 ENCOUNTER — Other Ambulatory Visit (HOSPITAL_COMMUNITY): Payer: Self-pay

## 2021-07-18 ENCOUNTER — Other Ambulatory Visit (HOSPITAL_COMMUNITY): Payer: Self-pay

## 2021-07-19 ENCOUNTER — Other Ambulatory Visit (HOSPITAL_COMMUNITY): Payer: Self-pay

## 2021-07-27 ENCOUNTER — Other Ambulatory Visit (HOSPITAL_COMMUNITY): Payer: Self-pay

## 2021-08-11 ENCOUNTER — Other Ambulatory Visit (HOSPITAL_COMMUNITY): Payer: Self-pay

## 2021-08-12 ENCOUNTER — Other Ambulatory Visit (HOSPITAL_COMMUNITY): Payer: Self-pay

## 2021-08-19 ENCOUNTER — Other Ambulatory Visit (HOSPITAL_COMMUNITY): Payer: Self-pay

## 2021-09-05 ENCOUNTER — Other Ambulatory Visit: Payer: Self-pay | Admitting: Neurology

## 2021-09-06 ENCOUNTER — Other Ambulatory Visit (HOSPITAL_COMMUNITY): Payer: Self-pay

## 2021-09-06 MED ORDER — SERTRALINE HCL 50 MG PO TABS
ORAL_TABLET | Freq: Every day | ORAL | 0 refills | Status: DC
  Filled 2021-09-06: qty 90, 30d supply, fill #0

## 2021-09-07 ENCOUNTER — Other Ambulatory Visit (HOSPITAL_COMMUNITY): Payer: Self-pay

## 2021-09-09 ENCOUNTER — Other Ambulatory Visit (HOSPITAL_COMMUNITY): Payer: Self-pay

## 2021-09-09 MED ORDER — AMLODIPINE BESYLATE 5 MG PO TABS
5.0000 mg | ORAL_TABLET | Freq: Every day | ORAL | 3 refills | Status: AC
  Filled 2021-09-09 – 2022-04-24 (×2): qty 90, 90d supply, fill #0
  Filled 2022-08-03: qty 90, 90d supply, fill #1

## 2021-09-15 ENCOUNTER — Other Ambulatory Visit (HOSPITAL_COMMUNITY): Payer: Self-pay

## 2021-09-15 MED ORDER — ALBUTEROL SULFATE HFA 108 (90 BASE) MCG/ACT IN AERS
INHALATION_SPRAY | RESPIRATORY_TRACT | 3 refills | Status: AC
  Filled 2021-09-15: qty 6.7, 50d supply, fill #0

## 2021-09-15 MED ORDER — LEVOTHYROXINE SODIUM 100 MCG PO TABS
100.0000 ug | ORAL_TABLET | Freq: Every day | ORAL | 0 refills | Status: DC
  Filled 2021-09-15: qty 90, 90d supply, fill #0

## 2021-09-17 ENCOUNTER — Other Ambulatory Visit (HOSPITAL_COMMUNITY): Payer: Self-pay

## 2021-09-23 DIAGNOSIS — K580 Irritable bowel syndrome with diarrhea: Secondary | ICD-10-CM | POA: Diagnosis not present

## 2021-10-11 ENCOUNTER — Other Ambulatory Visit (HOSPITAL_COMMUNITY): Payer: Self-pay

## 2021-10-11 DIAGNOSIS — E039 Hypothyroidism, unspecified: Secondary | ICD-10-CM | POA: Diagnosis not present

## 2021-10-11 DIAGNOSIS — Z79899 Other long term (current) drug therapy: Secondary | ICD-10-CM | POA: Diagnosis not present

## 2021-10-11 DIAGNOSIS — I10 Essential (primary) hypertension: Secondary | ICD-10-CM | POA: Diagnosis not present

## 2021-10-11 DIAGNOSIS — Z8739 Personal history of other diseases of the musculoskeletal system and connective tissue: Secondary | ICD-10-CM | POA: Diagnosis not present

## 2021-10-11 DIAGNOSIS — G250 Essential tremor: Secondary | ICD-10-CM | POA: Diagnosis not present

## 2021-10-11 DIAGNOSIS — R7301 Impaired fasting glucose: Secondary | ICD-10-CM | POA: Diagnosis not present

## 2021-10-11 DIAGNOSIS — Z8673 Personal history of transient ischemic attack (TIA), and cerebral infarction without residual deficits: Secondary | ICD-10-CM | POA: Diagnosis not present

## 2021-10-11 DIAGNOSIS — R569 Unspecified convulsions: Secondary | ICD-10-CM | POA: Diagnosis not present

## 2021-10-11 DIAGNOSIS — M8580 Other specified disorders of bone density and structure, unspecified site: Secondary | ICD-10-CM | POA: Diagnosis not present

## 2021-10-11 DIAGNOSIS — Z9682 Presence of neurostimulator: Secondary | ICD-10-CM | POA: Diagnosis not present

## 2021-10-11 DIAGNOSIS — R200 Anesthesia of skin: Secondary | ICD-10-CM | POA: Diagnosis not present

## 2021-10-11 DIAGNOSIS — E559 Vitamin D deficiency, unspecified: Secondary | ICD-10-CM | POA: Diagnosis not present

## 2021-10-11 DIAGNOSIS — I700 Atherosclerosis of aorta: Secondary | ICD-10-CM | POA: Diagnosis not present

## 2021-10-11 MED ORDER — PROPRANOLOL HCL ER 60 MG PO CP24
60.0000 mg | ORAL_CAPSULE | Freq: Every day | ORAL | 3 refills | Status: AC
  Filled 2021-10-11: qty 90, 90d supply, fill #0
  Filled 2022-01-08: qty 90, 90d supply, fill #1
  Filled 2022-07-13: qty 90, 90d supply, fill #2
  Filled 2022-10-11: qty 90, 90d supply, fill #3

## 2021-10-11 MED ORDER — PRIMIDONE 50 MG PO TABS
ORAL_TABLET | ORAL | 0 refills | Status: AC
  Filled 2021-10-11: qty 450, 90d supply, fill #0

## 2021-10-12 ENCOUNTER — Other Ambulatory Visit (HOSPITAL_COMMUNITY): Payer: Self-pay

## 2021-10-13 ENCOUNTER — Other Ambulatory Visit (HOSPITAL_COMMUNITY): Payer: Self-pay

## 2021-10-18 ENCOUNTER — Other Ambulatory Visit (HOSPITAL_COMMUNITY): Payer: Self-pay

## 2021-10-22 ENCOUNTER — Other Ambulatory Visit: Payer: Self-pay | Admitting: Neurology

## 2021-10-22 ENCOUNTER — Other Ambulatory Visit (HOSPITAL_COMMUNITY): Payer: Self-pay

## 2021-10-24 ENCOUNTER — Other Ambulatory Visit (HOSPITAL_COMMUNITY): Payer: Self-pay

## 2021-10-24 MED ORDER — EZETIMIBE 10 MG PO TABS
10.0000 mg | ORAL_TABLET | Freq: Every day | ORAL | 4 refills | Status: DC
  Filled 2021-10-24: qty 90, 90d supply, fill #0
  Filled 2022-01-29: qty 90, 90d supply, fill #1
  Filled 2022-04-24: qty 90, 90d supply, fill #2
  Filled 2022-08-03: qty 90, 90d supply, fill #3

## 2021-10-24 MED ORDER — ZOLPIDEM TARTRATE 5 MG PO TABS
5.0000 mg | ORAL_TABLET | Freq: Every day | ORAL | 5 refills | Status: DC
  Filled 2021-10-24 – 2021-11-09 (×3): qty 30, 30d supply, fill #0
  Filled 2021-12-01 – 2021-12-11 (×2): qty 30, 30d supply, fill #1
  Filled 2022-01-08: qty 30, 30d supply, fill #2
  Filled 2022-02-21: qty 30, 30d supply, fill #3

## 2021-10-24 MED ORDER — CLONAZEPAM 0.5 MG PO TABS
ORAL_TABLET | ORAL | 5 refills | Status: DC
  Filled 2021-10-24: qty 10, 23d supply, fill #0
  Filled 2021-12-01: qty 10, 23d supply, fill #1
  Filled 2022-01-08: qty 10, 23d supply, fill #2
  Filled 2022-01-29: qty 10, 23d supply, fill #3
  Filled 2022-02-21: qty 10, 23d supply, fill #4

## 2021-11-01 ENCOUNTER — Other Ambulatory Visit (HOSPITAL_COMMUNITY): Payer: Self-pay

## 2021-11-02 ENCOUNTER — Other Ambulatory Visit (HOSPITAL_COMMUNITY): Payer: Self-pay

## 2021-11-03 ENCOUNTER — Other Ambulatory Visit (HOSPITAL_COMMUNITY): Payer: Self-pay

## 2021-11-03 MED ORDER — LEVOTHYROXINE SODIUM 100 MCG PO TABS
100.0000 ug | ORAL_TABLET | Freq: Every morning | ORAL | 3 refills | Status: DC
  Filled 2021-11-03 – 2021-12-01 (×3): qty 90, 90d supply, fill #0
  Filled 2022-01-29 – 2022-03-09 (×2): qty 90, 90d supply, fill #1
  Filled 2022-06-14: qty 90, 90d supply, fill #2
  Filled 2022-09-19: qty 90, 90d supply, fill #3

## 2021-11-06 DIAGNOSIS — Z0100 Encounter for examination of eyes and vision without abnormal findings: Secondary | ICD-10-CM | POA: Diagnosis not present

## 2021-11-09 ENCOUNTER — Other Ambulatory Visit (HOSPITAL_COMMUNITY): Payer: Self-pay

## 2021-11-10 ENCOUNTER — Other Ambulatory Visit (HOSPITAL_COMMUNITY): Payer: Self-pay

## 2021-11-10 MED ORDER — AMLODIPINE BESYLATE 5 MG PO TABS
5.0000 mg | ORAL_TABLET | Freq: Every day | ORAL | 3 refills | Status: DC
  Filled 2021-11-10 – 2022-01-29 (×2): qty 90, 90d supply, fill #0
  Filled 2022-10-27: qty 90, 90d supply, fill #1

## 2021-11-11 ENCOUNTER — Other Ambulatory Visit: Payer: Self-pay | Admitting: Neurology

## 2021-11-11 ENCOUNTER — Other Ambulatory Visit (HOSPITAL_COMMUNITY): Payer: Self-pay

## 2021-11-11 MED ORDER — SERTRALINE HCL 50 MG PO TABS
150.0000 mg | ORAL_TABLET | Freq: Every day | ORAL | 3 refills | Status: DC
  Filled 2021-11-11: qty 270, 90d supply, fill #0
  Filled 2021-12-01: qty 270, 90d supply, fill #1

## 2021-11-17 ENCOUNTER — Other Ambulatory Visit (HOSPITAL_COMMUNITY): Payer: Self-pay

## 2021-11-18 ENCOUNTER — Other Ambulatory Visit (HOSPITAL_COMMUNITY): Payer: Self-pay

## 2021-12-01 ENCOUNTER — Other Ambulatory Visit (HOSPITAL_COMMUNITY): Payer: Self-pay

## 2021-12-02 ENCOUNTER — Other Ambulatory Visit (HOSPITAL_COMMUNITY): Payer: Self-pay

## 2021-12-11 ENCOUNTER — Other Ambulatory Visit (HOSPITAL_COMMUNITY): Payer: Self-pay

## 2021-12-11 MED ORDER — ATORVASTATIN CALCIUM 40 MG PO TABS
40.0000 mg | ORAL_TABLET | Freq: Every day | ORAL | 4 refills | Status: DC
  Filled 2021-12-11: qty 90, 90d supply, fill #0
  Filled 2022-03-09: qty 90, 90d supply, fill #1
  Filled 2022-06-14: qty 90, 90d supply, fill #2
  Filled 2022-10-17: qty 90, 90d supply, fill #3

## 2021-12-12 ENCOUNTER — Other Ambulatory Visit: Payer: Self-pay | Admitting: Endocrinology

## 2021-12-12 ENCOUNTER — Other Ambulatory Visit (HOSPITAL_COMMUNITY): Payer: Self-pay

## 2021-12-12 DIAGNOSIS — Z1231 Encounter for screening mammogram for malignant neoplasm of breast: Secondary | ICD-10-CM

## 2021-12-13 ENCOUNTER — Other Ambulatory Visit (HOSPITAL_COMMUNITY): Payer: Self-pay

## 2021-12-19 ENCOUNTER — Other Ambulatory Visit (HOSPITAL_COMMUNITY): Payer: Self-pay

## 2021-12-19 ENCOUNTER — Ambulatory Visit: Payer: 59 | Admitting: Podiatry

## 2021-12-19 DIAGNOSIS — L600 Ingrowing nail: Secondary | ICD-10-CM

## 2021-12-19 DIAGNOSIS — M79675 Pain in left toe(s): Secondary | ICD-10-CM

## 2021-12-19 DIAGNOSIS — M79674 Pain in right toe(s): Secondary | ICD-10-CM | POA: Diagnosis not present

## 2021-12-19 MED ORDER — SULFAMETHOXAZOLE-TRIMETHOPRIM 800-160 MG PO TABS
1.0000 | ORAL_TABLET | Freq: Two times a day (BID) | ORAL | 0 refills | Status: DC
  Filled 2021-12-19: qty 14, 7d supply, fill #0

## 2021-12-19 NOTE — Progress Notes (Signed)
error 

## 2021-12-19 NOTE — Patient Instructions (Signed)

## 2021-12-20 ENCOUNTER — Other Ambulatory Visit (HOSPITAL_COMMUNITY): Payer: Self-pay

## 2021-12-24 NOTE — Progress Notes (Signed)
Subjective:   Patient ID: Alyssa Mccarthy, female   DOB: 56 y.o.   MRN: [id number]   HPI Chief Complaint  Patient presents with   Ingrown Toenail    Bilateral ingrown toenails hallux    56 year old female presents with the above concerns.  She has not seen any drainage or pus to the area is tender with pressure.  She states the left side goes out long she will get pus underneath this.  No recent injuries.    Review of Systems  All other systems reviewed and are negative.  Past Medical History:  Diagnosis Date   Complication of anesthesia    Essential tremor    Hyperlipidemia    Hypothyroidism    PONV (postoperative nausea and vomiting)    Seizure disorder (HCC)    Stroke [facility])     Past Surgical History:  Procedure Laterality Date   ABDOMINAL HYSTERECTOMY     APPENDECTOMY     brain stimulator implant  02/2018   replacement    BRAIN SURGERY  2015   deep brain stimulator implants bilaterally   CARPAL TUNNEL RELEASE     PARTIAL PROCTECTOMY BY TEM N/A 12/04/2017   Procedure: PARTIAL PROCTECTOMY REMOVAL OF RECTAL MASS BY TEM ERAS PATHWAY;  Surgeon: Michael Boston, MD;  Location: [facility];  Service: General;  Laterality: N/A;   TOTAL KNEE ARTHROPLASTY       Current Outpatient Medications:    sulfamethoxazole-trimethoprim (BACTRIM DS) 800-160 MG tablet, Take 1 tablet by mouth 2 times daily., Disp: 14 tablet, Rfl: 0   albuterol (VENTOLIN HFA) 108 (90 Base) MCG/ACT inhaler, Inhale 2 puffs into the lungs 2 times a day for 4 days as needed then use 2 puffs 2 times a day as needed then wean after that, Disp: 6.7 g, Rfl: 3   amLODipine (NORVASC) 5 MG tablet, TAKE 1 TABLET BY MOUTH ONCE DAILY, Disp: 90 tablet, Rfl: 3   amLODipine (NORVASC) 5 MG tablet, TAKE 1 TABLET BY MOUTH ONCE DAILY, Disp: 90 tablet, Rfl: 3   amLODipine (NORVASC) 5 MG tablet, Take 1 tablet (5 mg total) by mouth daily., Disp: 90 tablet, Rfl: 3   aspirin EC 325 MG EC tablet, Take 1 tablet (325 mg total) by mouth daily.,  Disp: 30 tablet, Rfl: 1   atorvastatin (LIPITOR) 40 MG tablet, Take 1 tablet (40 mg total) by mouth daily., Disp: 90 tablet, Rfl: 4   clonazePAM (KLONOPIN) 0.5 MG tablet, TAKE 1 TABLET BY MOUTH ONCE A DAY AS NEEDED FOR SEIZURE (DONT USE MORE THAN 2-3 A WEEK), Disp: 10 tablet, Rfl: 5   Estradiol 10 MCG TABS vaginal tablet, Vaginal every 2 weeks, Disp: , Rfl:    ezetimibe (ZETIA) 10 MG tablet, Take 10 mg by mouth daily. , Disp: , Rfl:    ezetimibe (ZETIA) 10 MG tablet, Take 1 tablet (10 mg total) by mouth daily., Disp: 90 tablet, Rfl: 4   lamoTRIgine (LAMICTAL) 100 MG tablet, Take 1 tablet by mouth twice a day, Disp: 180 tablet, Rfl: 3   levETIRAcetam (KEPPRA XR) 500 MG 24 hr tablet, Take 5 tablets by mouth daily as directed, Disp: 450 tablet, Rfl: 3   levothyroxine (SYNTHROID) 100 MCG tablet, Take 1 tablet (100 mcg total) by mouth in the morning on an empty stomach, Disp: 90 tablet, Rfl: 3   Melatonin 5 MG TABS, Take by mouth as needed (sleep). 10-20 mg for sleep, Disp: , Rfl:    meloxicam (MOBIC) 15 MG tablet, Take 1 tablet(s) by  mouth twice daily for 5 days, then take one tablet by mouth daily as needed, Disp: 60 tablet, Rfl: 1   metoprolol succinate (TOPROL-XL) 25 MG 24 hr tablet, Take 25 mg by mouth daily., Disp: , Rfl:    molnupiravir EUA (LAGEVRIO) 200 MG CAPS capsule, Take 4 capsules by mouth every 12 hours for 5 days, Disp: 40 capsule, Rfl: 0   primidone (MYSOLINE) 50 MG tablet, TAKE 2 TABLETS BY MOUTH EVERY MORNING AND 3 TABLETS IN THE EVENING, Disp: 150 tablet, Rfl: 5   primidone (MYSOLINE) 50 MG tablet, Take 2 tablets (100 mg total) by mouth in the morning, THEN 3 tablets (150 mg total) at night, Disp: 450 tablet, Rfl: 0   propranolol ER (INDERAL LA) 60 MG 24 hr capsule, TAKE 1 CAPSULE BY MOUTH ONCE DAILY, Disp: 30 capsule, Rfl: 11   propranolol ER (INDERAL LA) 60 MG 24 hr capsule, Take 1 capsule (60 mg total) by mouth daily., Disp: 90 capsule, Rfl: 3   sertraline (ZOLOFT) 50 MG tablet,  Take 3 tablets (150 mg total) by mouth daily., Disp: 270 tablet, Rfl: 3   zolpidem (AMBIEN) 5 MG tablet, Take 1 tablet (5 mg total) by mouth at bedtime., Disp: 30 tablet, Rfl: 5  Allergies  Allergen Reactions   Gluten Meal Diarrhea and Other (See Comments)    Severe diarrhea   Other Other (See Comments)    Bleach-respiratory distress   Plavix [Clopidogrel Bisulfate] Itching, Rash and Other (See Comments)    Hands and feet    Protonix [Pantoprazole] Itching and Other (See Comments)    Whelps   Penicillins Itching, Rash and Other (See Comments)    Childhood allergy- specifics unknown; tolerated amoxicillin and ancef since then            Objective:  Physical Exam  General: AAO x3, NAD  Dermatological: Incurvation present to the hallux toenails bilaterally on both the medial and lateral borders.  There is currently no drainage or pus.  Localized edema of the nail borders without any ascending cellulitis.  No fluctuance or crepitation.   No open lesions.  Vascular: Dorsalis Pedis artery and Posterior Tibial artery pedal pulses are 2/4 bilateral with immedate capillary fill time.  There is no pain with calf compression, swelling, warmth, erythema.   Neruologic: Grossly intact via light touch bilateral.   Musculoskeletal: Tenderness to ingrown toenails.  No other areas of discomfort.  Muscular strength 5/5 in all groups tested bilateral.       Assessment:   Bilateral hallux ingrown toenail     Plan:  -Treatment options discussed including all alternatives, risks, and complications -Etiology of symptoms were discussed -At this time, the patient is requesting partial nail removal with chemical matricectomy to the symptomatic portion of the nail. Risks and complications were discussed with the patient for which they understand and written consent was obtained. Under sterile conditions a total of 3 mL of a mixture of 2% lidocaine plain and 0.5% Marcaine plain was infiltrated in a  hallux block fashion. Once anesthetized, the skin was prepped in sterile fashion. A tourniquet was then applied. Next the medial and lateral aspect of hallux nail border was then sharply excised making sure to remove the entire offending nail border. Once the nails were ensured to be removed area was debrided and the underlying skin was intact. There is no purulence identified in the procedure. Next phenol was then applied under standard conditions and copiously irrigated. Silvadene was applied. A dry sterile dressing was  applied. After application of the dressing the tourniquet was removed and there is found to be an immediate capillary refill time to the digit. The patient tolerated the procedure well any complications. Post procedure instructions were discussed the patient for which he verbally understood. Discussed signs/symptoms of infection and directed to call the office immediately should any occur or go directly to the emergency room. In the meantime, encouraged to call the office with any questions, concerns, changes symptoms. -Bactrim  Trula Slade DPM

## 2021-12-30 ENCOUNTER — Other Ambulatory Visit (HOSPITAL_COMMUNITY): Payer: Self-pay

## 2021-12-30 ENCOUNTER — Encounter: Payer: Self-pay | Admitting: Neurology

## 2021-12-30 ENCOUNTER — Ambulatory Visit: Payer: 59 | Admitting: Neurology

## 2021-12-30 VITALS — BP 130/82 | HR 61 | Ht 62.0 in | Wt 150.2 lb

## 2021-12-30 DIAGNOSIS — G40309 Generalized idiopathic epilepsy and epileptic syndromes, not intractable, without status epilepticus: Secondary | ICD-10-CM

## 2021-12-30 DIAGNOSIS — G4700 Insomnia, unspecified: Secondary | ICD-10-CM

## 2021-12-30 DIAGNOSIS — G5622 Lesion of ulnar nerve, left upper limb: Secondary | ICD-10-CM | POA: Diagnosis not present

## 2021-12-30 MED ORDER — LEVETIRACETAM ER 500 MG PO TB24
2500.0000 mg | ORAL_TABLET | Freq: Every day | ORAL | 3 refills | Status: DC
  Filled 2021-12-30: qty 450, fill #0
  Filled 2022-01-08 – 2022-02-21 (×3): qty 450, 90d supply, fill #0
  Filled 2022-05-22: qty 450, 90d supply, fill #1
  Filled 2022-09-05: qty 450, 90d supply, fill #2

## 2021-12-30 MED ORDER — LAMOTRIGINE 100 MG PO TABS
100.0000 mg | ORAL_TABLET | Freq: Two times a day (BID) | ORAL | 3 refills | Status: DC
Start: 2021-12-30 — End: 2022-09-13
  Filled 2021-12-30 – 2022-01-29 (×3): qty 180, 90d supply, fill #0
  Filled 2022-05-16: qty 180, 90d supply, fill #1
  Filled 2022-08-13: qty 180, 90d supply, fill #2

## 2021-12-30 NOTE — Patient Instructions (Signed)
Always a pleasure to see you. Continue all your medications. When you are ready, pls call Dr. Guadelupe Sabin office to schedule a visit with her and discuss sleep study. Follow-up in 8 months. Have fun and be safe!!  Seizure Precautions: 1. If medication has been prescribed for you to prevent seizures, take it exactly as directed.  Do not stop taking the medicine without talking to your doctor first, even if you have not had a seizure in a long time.   2. Avoid activities in which a seizure would cause danger to yourself or to others.  Don't operate dangerous machinery, swim alone, or climb in high or dangerous places, such as on ladders, roofs, or girders.  Do not drive unless your doctor says you may.  3. If you have any warning that you may have a seizure, lay down in a safe place where you can't hurt yourself.    4.  No driving for 6 months from last seizure, as per [facility].   Please refer to the following link on the [facility] website for more information: http://[email or web address]/[email or web address]/drivingu.cfm   5.  Maintain good sleep hygiene. Avoid alcohol.  6.  Contact your doctor if you have any problems that may be related to the medicine you are taking.  7.  Call 911 and bring the patient back to the ED if:        A.  The seizure lasts longer than 5 minutes.       B.  The patient doesn't awaken shortly after the seizure  C.  The patient has new problems such as difficulty seeing, speaking or moving  D.  The patient was injured during the seizure  E.  The patient has a temperature over 102 F (39C)  F.  The patient vomited and now is having trouble breathing

## 2021-12-30 NOTE — Progress Notes (Unsigned)
NEUROLOGY FOLLOW UP OFFICE NOTE  RHEANNE Mccarthy [id number] 30-Nov-1965  HISTORY OF PRESENT ILLNESS: I had the pleasure of seeing Alyssa Mccarthy in follow-up in the neurology clinic on 12/30/2021.  The patient was last seen 9 months ago for seizures. She is alone in the office today. Records and images were personally reviewed where available.  Since her last visit, she reports doing well, seizure-free since May 2021 when she reported double vision, slowed speech, stumbling, and difficulty remembering at work. She has prn clonazepam for seizure rescue. She continues on Levetiracetam ER '2500mg'$  daily and Lamictal '100mg'$  BID. She is on Primidone '100mg'$  in AM, '150mg'$  in PM for ET. She reports weaning herself off Zoloft with no issues. Her tremors seem better than usual. She has cut down Ambien '5mg'$  to 3-4 times a week. She usually starts with melatonin 5-'10mg'$ , if it does not help she would take 1 or 2 Benadryl. The Ambien is her last resort if she does not sleep well for 2 nights. She had been seen by sleep specialist Dr. Rexene Alberts who recommended a sleep study but she has not followed up yet. She reports tingling in the last 2 digits on her left hand radiating to her elbow. Legs are unaffected. Her husband has noticed her gait is a bit wider, no falls. .   History on Initial Assessment 01/29/2017: This is a pleasant 56 yo RH woman with a history of idiopathic generalized epilepsy, essential tremor s/p VIM DBS, presenting to establish care. She had been seeing epileptologist Dr. [name] at [facility], records were reviewed. She sees Movement Disorder specialist Dr. [name] for the essential tremor. Seizures started around age 56. She reports seizures were well-controlled for 25 years, then after she had her daughter, she was having more seizures for 3-4 years, then overall has been well-controlled the past 10 years. She has no clear aura, describes body jerking that progresses to a generalized convulsion. Flashing lights and sleep  deprivation are seizure triggers. She has not had any seizures since around 2015 when Depakote was re-added to her regimen. She is currently on Lamictal '100mg'$  BID, Keppra XR '2500mg'$  daily, and Depakote ER '500mg'$  daily with no side effects. She had been on Depakote for many years in the past but had weight gain, it was stopped, then restarted around 4 years ago. She is also on Primidone for the tremor. She denies any olfactory/gustatory hallucinations, deja vu, rising epigastric sensation, focal numbness/tingling/weakness, myoclonic jerks. She rarely has a funny feeling that "something is not quite right" and would take a prn clonazepam as a precaution, she has not needed this in about a year.    She denies any headaches, dizziness, diplopia, dysarthria/dysphagia, neck/back pain, bowel/bladder dysfunction. Sleep is good. She had been taking Zoloft increased dose 4 years ago due to irritability, improved with higher dose. She was diagnosed with a stroke in January 2018 when she had a bad headache and anisocoria. MRI brain showed subtle T2 hyperintensity in the pons, R>L. She saw Dr. 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 She has chronic mild right-sided sensory changes since then. She has also had symptoms every once in a while where there is something she can't see (not clearly on peripheral vision), she blinks and vision is back to normal. She has chronic numbness on the left elbow region. She has noticed she has no interest in eating, but makes herself eat. 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  Diagnostic Data: MRI brain in March 2011 reported as normal. EEG in March 2010 abnormal due to generalized polyspike and wave, consistent with a primary generalized epilepsy Prior AEDs: Phenobarbital, Zonisamide, Dilantin. On a different generic Levetiracetam, she had dry hands and peeling in fingertips, which resolved after switching to a different generic   Epilepsy Risk Factors:   There is a strong family history of seizures in her paternal grandfather, father, and daughter. Otherwise she had a normal birth and early development.  There is no history of febrile convulsions, CNS infections such as meningitis/encephalitis, significant traumatic brain injury, neurosurgical procedures.   PAST MEDICAL HISTORY: Past Medical History:  Diagnosis Date   Complication of anesthesia    Essential tremor    Hyperlipidemia    Hypothyroidism    PONV (postoperative nausea and vomiting)    Seizure disorder (HCC)    Stroke [facility])     MEDICATIONS: Current Outpatient Medications on File Prior to Visit  Medication Sig Dispense Refill   albuterol (VENTOLIN HFA) 108 (90 Base) MCG/ACT inhaler Inhale 2 puffs into the lungs 2 times a day for 4 days as needed then use 2 puffs 2 times a day as needed then wean after that 6.7 g 3   amLODipine (NORVASC) 5 MG tablet TAKE 1 TABLET BY MOUTH ONCE DAILY 90 tablet 3   amLODipine (NORVASC) 5 MG tablet TAKE 1 TABLET BY MOUTH ONCE DAILY 90 tablet 3   amLODipine (NORVASC) 5 MG tablet Take 1 tablet (5 mg total) by mouth daily. 90 tablet 3   aspirin EC 325 MG EC tablet Take 1 tablet (325 mg total) by mouth daily. 30 tablet 1   atorvastatin (LIPITOR) 40 MG tablet Take 1 tablet (40 mg total) by mouth daily. 90 tablet 4   clonazePAM (KLONOPIN) 0.5 MG tablet TAKE 1 TABLET BY MOUTH ONCE A DAY AS NEEDED FOR SEIZURE (DONT USE MORE THAN 2-3 A WEEK) 10 tablet 5   Estradiol 10 MCG TABS vaginal tablet Vaginal every 2 weeks     ezetimibe (ZETIA) 10 MG tablet Take 10 mg by mouth daily.      ezetimibe (ZETIA) 10 MG tablet Take 1 tablet (10 mg total) by mouth daily. 90 tablet 4   lamoTRIgine (LAMICTAL) 100 MG tablet Take 1 tablet by mouth twice a day 180 tablet 3   levETIRAcetam (KEPPRA XR) 500 MG 24 hr tablet Take 5 tablets by mouth daily as directed 450 tablet 3   levothyroxine (SYNTHROID) 100 MCG tablet Take 1 tablet (100 mcg total) by mouth in the morning on an empty  stomach 90 tablet 3   Melatonin 5 MG TABS Take by mouth as needed (sleep). 10-20 mg for sleep     meloxicam (MOBIC) 15 MG tablet Take 1 tablet(s) by mouth twice daily for 5 days, then take one tablet by mouth daily as needed 60 tablet 1   metoprolol succinate (TOPROL-XL) 25 MG 24 hr tablet Take 25 mg by mouth daily.     molnupiravir EUA (LAGEVRIO) 200 MG CAPS capsule Take 4 capsules by mouth every 12 hours for 5 days 40 capsule 0   primidone (MYSOLINE) 50 MG tablet TAKE 2 TABLETS BY MOUTH EVERY MORNING AND 3 TABLETS IN THE EVENING 150 tablet 5   primidone (MYSOLINE) 50 MG tablet Take 2 tablets (100 mg total) by mouth in the morning, THEN 3 tablets (150 mg total) at night 450 tablet 0   propranolol ER (INDERAL LA) 60 MG 24 hr capsule TAKE 1 CAPSULE  BY MOUTH ONCE DAILY 30 capsule 11   propranolol ER (INDERAL LA) 60 MG 24 hr capsule Take 1 capsule (60 mg total) by mouth daily. 90 capsule 3   sertraline (ZOLOFT) 50 MG tablet Take 3 tablets (150 mg total) by mouth daily. 270 tablet 3   sulfamethoxazole-trimethoprim (BACTRIM DS) 800-160 MG tablet Take 1 tablet by mouth 2 times daily. 14 tablet 0   zolpidem (AMBIEN) 5 MG tablet Take 1 tablet (5 mg total) by mouth at bedtime. 30 tablet 5   No current facility-administered medications on file prior to visit.    ALLERGIES: Allergies  Allergen Reactions   Gluten Meal Diarrhea and Other (See Comments)    Severe diarrhea   Other Other (See Comments)    Bleach-respiratory distress   Plavix [Clopidogrel Bisulfate] Itching, Rash and Other (See Comments)    Hands and feet    Protonix [Pantoprazole] Itching and Other (See Comments)    Whelps   Penicillins Itching, Rash and Other (See Comments)    Childhood allergy- specifics unknown; tolerated amoxicillin and ancef since then     FAMILY HISTORY: Family History  Problem Relation Age of Onset   Breast cancer Mother    Heart attack Father 36   Stroke Neg Hx     SOCIAL HISTORY: Social History    Socioeconomic History   Marital status: Married    Spouse name: Not on file   Number of children: Not on file   Years of education: Not on file   Highest education level: Not on file  Occupational History   Not on file  Tobacco Use   Smoking status: Never   Smokeless tobacco: Never  Vaping Use   Vaping Use: Never used  Substance and Sexual Activity   Alcohol use: Yes    Comment: socially   Drug use: No   Sexual activity: Not on file  Other Topics Concern   Not on file  Social History Narrative   Lives in 1 story home with her husband and 2 sons   Has 2 adult sons   Has bach. Degree   Works as Designer, jewellery   Right handed   Social Determinants of [place] Strain: Not on file  Food Insecurity: Not on file  Transportation Needs: Not on file  Physical Activity: Not on file  Stress: Not on file  Social Connections: Not on file  Intimate Partner Violence: Not on file     PHYSICAL EXAM: Vitals:   12/30/21 1218  BP: 130/82  Pulse: 61   General: No acute distress Head:  Normocephalic/atraumatic Skin/Extremities: No rash, no edema Neurological Exam: alert and awake. No aphasia or dysarthria. Fund of knowledge is appropriate.  Attention and concentration are normal.   Cranial nerves: Left pupil slightly smaller than right (chronic). Extraocular movements intact with no nystagmus. Visual fields full.  No facial asymmetry.  Motor: Bulk and tone normal, muscle strength 5/5 throughout with no pronator drift.   Finger to nose testing intact.  Gait narrow-based and steady, able to tandem walk adequately.  Romberg negative. No significant tremor today. 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 She is dong well with no seizures or seizure-like symptoms since May 2021, continue Keppra XR  '2500mg'$  daily and Lamotrigine '100mg'$  BID. She is also on Primidone for  essential tremor. She has prn clonazepam for breakthrough seizures. She has weaned off Sertraline and reduced Ambien intake. We discussed contacting Dr. Guadelupe Sabin office to schedule a follow-up which is needed before she can schedule the sleep study previously recommended. We discussed +Tinel sign and tingling in the distribution of the ulnar nerve, suggestive of ulnar neuropathy, she will try using an elbow brace. She is aware of [place] driving laws to stop driving after a seizure until 6 months seizure-free. Follow-up in 8 months, call for any changes.    Thank you for allowing me to participate in her care.  Please do not hesitate to call for any questions or concerns.    Ellouise Newer, M.D.   CC: Dr. Forde Dandy

## 2022-01-06 ENCOUNTER — Ambulatory Visit: Payer: 59 | Admitting: Neurology

## 2022-01-08 ENCOUNTER — Other Ambulatory Visit (HOSPITAL_COMMUNITY): Payer: Self-pay

## 2022-01-09 ENCOUNTER — Other Ambulatory Visit: Payer: Self-pay

## 2022-01-10 ENCOUNTER — Ambulatory Visit: Payer: 59 | Admitting: Podiatry

## 2022-01-10 ENCOUNTER — Other Ambulatory Visit (HOSPITAL_COMMUNITY): Payer: Self-pay

## 2022-01-10 DIAGNOSIS — L600 Ingrowing nail: Secondary | ICD-10-CM

## 2022-01-10 MED ORDER — PRIMIDONE 50 MG PO TABS
ORAL_TABLET | ORAL | 3 refills | Status: DC
  Filled 2022-01-10 – 2022-03-09 (×4): qty 450, 90d supply, fill #0
  Filled 2022-05-16 – 2022-12-15 (×9): qty 450, 90d supply, fill #1

## 2022-01-11 ENCOUNTER — Other Ambulatory Visit (HOSPITAL_COMMUNITY): Payer: Self-pay

## 2022-01-13 NOTE — Progress Notes (Signed)
Subjective: Chief Complaint  Patient presents with   Nail Problem    Nail check    56 year old female presents the office with above concerns.  She states she is doing well.  She has not tenderness with the first little bit but this subsided and she denied any pain.  Denies any drainage or pus.  Subsequent Epsom salts.  No redness.  Objective: AAO x3, NAD DP/PT pulses palpable bilaterally, CRT less than 3 seconds Status post partial nail avulsions.  Scabbing is present with some dried blood however there is no edema, erythema, drainage or pus or signs of infection.  No open lesions. No pain with calf compression, swelling, warmth, erythema  Assessment: Ingrown toenails  Plan: -All treatment options discussed with the patient including all alternatives, risks, complications.  -Procedure sites appear to be healing well.  Minimal scabbing, dried blood present.  Keep a bandage on the area during the day to avoid pressure.  Epsom salts soaks.  Monitor for signs or symptoms of recurrence or infection. -Patient encouraged to call the office with any questions, concerns, change in symptoms.   Trula Slade DPM

## 2022-01-30 ENCOUNTER — Other Ambulatory Visit: Payer: Self-pay

## 2022-01-30 ENCOUNTER — Other Ambulatory Visit (HOSPITAL_COMMUNITY): Payer: Self-pay

## 2022-02-08 ENCOUNTER — Ambulatory Visit
Admission: RE | Admit: 2022-02-08 | Discharge: 2022-02-08 | Disposition: A | Discharge status: Home / Self Care | Payer: Commercial Managed Care - PPO | Source: Ambulatory Visit | Attending: Endocrinology | Admitting: Endocrinology

## 2022-02-08 DIAGNOSIS — Z1231 Encounter for screening mammogram for malignant neoplasm of breast: Secondary | ICD-10-CM | POA: Diagnosis not present

## 2022-02-12 ENCOUNTER — Encounter: Payer: Self-pay | Admitting: Emergency Medicine

## 2022-02-12 ENCOUNTER — Ambulatory Visit (INDEPENDENT_AMBULATORY_CARE_PROVIDER_SITE_OTHER): Payer: Commercial Managed Care - PPO

## 2022-02-12 ENCOUNTER — Ambulatory Visit
Admission: EM | Admit: 2022-02-12 | Discharge: 2022-02-12 | Disposition: A | Discharge status: Home / Self Care | Payer: Commercial Managed Care - PPO | Attending: Emergency Medicine | Admitting: Emergency Medicine

## 2022-02-12 DIAGNOSIS — S99912A Unspecified injury of left ankle, initial encounter: Secondary | ICD-10-CM

## 2022-02-12 DIAGNOSIS — S93602A Unspecified sprain of left foot, initial encounter: Secondary | ICD-10-CM

## 2022-02-12 DIAGNOSIS — M25572 Pain in left ankle and joints of left foot: Secondary | ICD-10-CM | POA: Diagnosis not present

## 2022-02-12 DIAGNOSIS — S93412A Sprain of calcaneofibular ligament of left ankle, initial encounter: Secondary | ICD-10-CM

## 2022-02-12 DIAGNOSIS — S93492A Sprain of other ligament of left ankle, initial encounter: Secondary | ICD-10-CM

## 2022-02-12 DIAGNOSIS — S99922A Unspecified injury of left foot, initial encounter: Secondary | ICD-10-CM | POA: Diagnosis not present

## 2022-02-12 MED ORDER — IBUPROFEN 600 MG PO TABS: 600.0000 mg | ORAL_TABLET | Freq: Four times a day (QID) | ORAL | 0 refills | Status: AC | PRN

## 2022-02-12 NOTE — Discharge Instructions (Addendum)
There were no fractures on your ankle or foot x-ray.  Take 600 mg of the ibuprofen, 1000 mg of Tylenol 3 times a day.  Continue ice, elevation, crutches.  Wear the ASO as needed for comfort.  I have put in an urgent referral to Dr. Benjamine Mola, sports medicine for reevaluation.  You may benefit from physical therapy.

## 2022-02-12 NOTE — ED Triage Notes (Signed)
Pain to left ankle after rolling last night while dancing last night Bruising noted  Here w/ husband Ibuprofen at 0700 - mod relief Oxycontin last night at 2200

## 2022-02-12 NOTE — ED Provider Notes (Signed)
HPI  SUBJECTIVE:  Alyssa Mccarthy is a 57 y.o. female who presents with left lateral ankle pain, swelling, inability to bear weight after rolling her ankle while dancing in high heels last night.  Cannot recall if she inverted or everted her ankle.  She reports limitation of motion of the ankle and bruising.  She was unable to bear weight immediately and has not been able to do so since then.  She denies direct trauma to the ankle, distal numbness or tingling.  She tried crutches, ice, elevation, 2 rounds of ibuprofen and some leftover oxycodone with improvement in her symptoms.  Symptoms are worse with movement, trying to weight-bear or holding her ankle still.  She has a past medical history of CVA and is on aspirin 325 for this.  She also has a history of epilepsy, hypertension, hypothyroidism and left navicular fracture.  PCP: [facility].    Past Medical History:  Diagnosis Date   Complication of anesthesia    Essential tremor    Hyperlipidemia    Hypothyroidism    PONV (postoperative nausea and vomiting)    Seizure disorder ([place])    Stroke [facility])     Past Surgical History:  Procedure Laterality Date   ABDOMINAL HYSTERECTOMY     APPENDECTOMY     brain stimulator implant  02/2018   replacement    BRAIN SURGERY  2015   deep brain stimulator implants bilaterally   CARPAL TUNNEL RELEASE     PARTIAL PROCTECTOMY BY TEM N/A 12/04/2017   Procedure: PARTIAL PROCTECTOMY REMOVAL OF RECTAL MASS BY TEM ERAS PATHWAY;  Surgeon: Michael Boston, MD;  Location: [facility];  Service: General;  Laterality: N/A;   TOTAL KNEE ARTHROPLASTY      Family History  Problem Relation Age of Onset   Breast cancer Mother    Heart attack Father 51   Stroke Neg Hx     Social History   Tobacco Use   Smoking status: Never   Smokeless tobacco: Never  Vaping Use   Vaping Use: Never used  Substance Use Topics   Alcohol use: Yes    Comment: socially   Drug use: No    No current facility-administered  medications for this encounter.  Current Outpatient Medications:    ibuprofen (ADVIL) 600 MG tablet, Take 1 tablet (600 mg total) by mouth every 6 (six) hours as needed., Disp: 30 tablet, Rfl: 0   albuterol (VENTOLIN HFA) 108 (90 Base) MCG/ACT inhaler, Inhale 2 puffs into the lungs 2 times a day for 4 days as needed then use 2 puffs 2 times a day as needed then wean after that, Disp: 6.7 g, Rfl: 3   amLODipine (NORVASC) 5 MG tablet, TAKE 1 TABLET BY MOUTH ONCE DAILY (Patient not taking: Reported on 02/12/2022), Disp: 90 tablet, Rfl: 3   amLODipine (NORVASC) 5 MG tablet, Take 1 tablet (5 mg total) by mouth daily., Disp: 90 tablet, Rfl: 3   aspirin EC 325 MG EC tablet, Take 1 tablet (325 mg total) by mouth daily., Disp: 30 tablet, Rfl: 1   atorvastatin (LIPITOR) 40 MG tablet, Take 1 tablet (40 mg total) by mouth daily., Disp: 90 tablet, Rfl: 4   clonazePAM (KLONOPIN) 0.5 MG tablet, TAKE 1 TABLET BY MOUTH ONCE A DAY AS NEEDED FOR SEIZURE (DONT USE MORE THAN 2-3 A WEEK), Disp: 10 tablet, Rfl: 5   ezetimibe (ZETIA) 10 MG tablet, Take 10 mg by mouth daily.  (Patient not taking: Reported on 02/12/2022), Disp: , Rfl:  ezetimibe (ZETIA) 10 MG tablet, Take 1 tablet (10 mg total) by mouth daily., Disp: 90 tablet, Rfl: 4   lamoTRIgine (LAMICTAL) 100 MG tablet, Take 1 tablet (100 mg total) by mouth 2 (two) times daily., Disp: 180 tablet, Rfl: 3   levETIRAcetam (KEPPRA XR) 500 MG 24 hr tablet, Take 5 tablets (2,500 mg total) by mouth daily as directed, Disp: 450 tablet, Rfl: 3   levothyroxine (SYNTHROID) 100 MCG tablet, Take 1 tablet (100 mcg total) by mouth in the morning on an empty stomach, Disp: 90 tablet, Rfl: 3   Melatonin 10 MG TABS, Take 10 mg by mouth as needed (sleep). 10-20 mg for sleep, Disp: , Rfl:    metoprolol succinate (TOPROL-XL) 25 MG 24 hr tablet, Take 25 mg by mouth daily., Disp: , Rfl:    Multiple Vitamin (MULTIVITAMIN) tablet, Take 1 tablet by mouth daily., Disp: , Rfl:    primidone  (MYSOLINE) 50 MG tablet, TAKE 2 TABLETS BY MOUTH EVERY MORNING AND 3 TABLETS IN THE EVENING (Patient not taking: Reported on 02/12/2022), Disp: 150 tablet, Rfl: 5   primidone (MYSOLINE) 50 MG tablet, Take 2 tablets (100 mg total) by mouth in the morning, THEN 3 tablets (150 mg total) at night (Patient not taking: Reported on 02/12/2022), Disp: 450 tablet, Rfl: 0   primidone (MYSOLINE) 50 MG tablet, Take 2 tablets (100 mg total) by mouth every morning AND 3 tablets (150 mg total) at bedtime., Disp: 450 tablet, Rfl: 3   propranolol ER (INDERAL LA) 60 MG 24 hr capsule, TAKE 1 CAPSULE BY MOUTH ONCE DAILY (Patient not taking: Reported on 02/12/2022), Disp: 30 capsule, Rfl: 11   propranolol ER (INDERAL LA) 60 MG 24 hr capsule, Take 1 capsule (60 mg total) by mouth daily., Disp: 90 capsule, Rfl: 3   zolpidem (AMBIEN) 5 MG tablet, Take 1 tablet (5 mg total) by mouth at bedtime., Disp: 30 tablet, Rfl: 5  Allergies  Allergen Reactions   Other Other (See Comments)    Bleach-respiratory distress   Plavix [Clopidogrel Bisulfate] Itching, Rash and Other (See Comments)    Hands and feet    Protonix [Pantoprazole] Itching and Other (See Comments)    Whelps   Penicillins Itching, Rash and Other (See Comments)    Childhood allergy- specifics unknown; tolerated amoxicillin and ancef since then      ROS  As noted in HPI.   Physical Exam  BP (!) 155/85 (BP Location: Left Arm)   Pulse 72   Temp 98.3 F (36.8 C) (Oral)   Resp 16   Ht '5\' 2"'$  (1.575 m)   Wt 68.5 kg   SpO2 99%   BMI 27.62 kg/m   Constitutional: Well developed, well nourished, no acute distress Eyes:  EOMI, conjunctiva normal bilaterally HENT: Normocephalic, atraumatic,mucus membranes moist Respiratory: Normal inspiratory effort Cardiovascular: Normal rate GI: nondistended skin: No rash, skin intact Musculoskeletal: L Ankle: Lateral soft tissue swelling.  Proximal fibula NT , Distal fibula tender, Medial malleolus NT ,  Deltoid  ligaments medially NT,  ATFL tender, calcaneofibular ligament tender, posterior tablofibular ligament tender ,  Achilles NT, calcaneus T ,  Proximal 5th metatarsal tender, base of fourth metatarsal tender, Midfoot NT, distal NVI with baseline sensation / motor to foot with DP 2+. Pain  with dorsiflexion/plantar flexion. Pain  with inversion/eversion.  No appreciable bruising.  Negative squeeze test.  Ant drawer test stable. Pt not able to bear weight in dept.   Neurologic: Alert & oriented x 3, no focal neuro  deficits Psychiatric: Speech and behavior appropriate   ED Course   Medications - No data to display  Orders Placed This Encounter  Procedures   DG Ankle Complete Left    Standing Status:   Standing    Number of Occurrences:   1    Order Specific Question:   Reason for Exam (SYMPTOM  OR DIAGNOSIS REQUIRED)    Answer:   rolled ankle last night while dancing - pain with standing and ROM    Order Specific Question:   Release to patient    Answer:   Immediate   DG Foot Complete Left    Standing Status:   Standing    Number of Occurrences:   1    Order Specific Question:   Reason for Exam (SYMPTOM  OR DIAGNOSIS REQUIRED)    Answer:   rolled ankle while dancing last night - also c/o foot pain    Order Specific Question:   Is patient pregnant?    Answer:   No    Order Specific Question:   Release to patient    Answer:   Immediate   AMB referral to sports medicine    Referral Priority:   Urgent    Referral Type:   Consultation    Referral Reason:   Specialty Services Required    Referred to Provider:   Silverio Decamp, MD    Number of Visits Requested:   1   Apply ASO ankle    Standing Status:   Standing    Number of Occurrences:   1    Order Specific Question:   Laterality    Answer:   Left    No results found for this or any previous visit (from the past 24 hour(s)). DG Foot Complete Left  Result Date: 02/12/2022 CLINICAL DATA:  Rolled ankle, pain EXAM: LEFT FOOT -  COMPLETE 3+ VIEW COMPARISON:  None Available. FINDINGS: There is no evidence of fracture or dislocation. There is no evidence of arthropathy or other focal bone abnormality. Soft tissues are unremarkable. IMPRESSION: No fracture or dislocation of the left foot. Joint spaces are preserved. Electronically Signed   By: Delanna Ahmadi M.D.   On: 02/12/2022 12:20   DG Ankle Complete Left  Result Date: 02/12/2022 CLINICAL DATA:  Rolled ankle last night while dancing. Left ankle pain. History of prior navicular fracture 30 years ago. EXAM: LEFT ANKLE COMPLETE - 3+ VIEW COMPARISON:  None Available. FINDINGS: Normal bone mineralization. The ankle mortise is symmetric and intact. Minimal dorsal talonavicular degenerative osteophytes. No acute fracture is seen. No dislocation. No soft tissue swelling. IMPRESSION: Minimal talonavicular osteoarthritis. No acute fracture. Electronically Signed   By: Yvonne Kendall M.D.   On: 02/12/2022 11:35    ED Clinical Impression  1. Sprain of anterior talofibular ligament of left ankle, initial encounter   2. Sprain of calcaneofibular ligament of left ankle, initial encounter   3. Sprain of left foot, initial encounter      ED Assessment/Plan      Reviewed imaging independently.  No fracture, dislocation left ankle or foot.  See radiology report for full details.  Patient presents with an ankle and foot sprain.  Placing in an ASO.  She already has crutches.  Home with Tylenol/ibuprofen, may take the leftover OxyContin as needed for severe pain.  Will refer to Dr. Dianah Field, sports medicine for follow-up within the week.  Discussed imaging, MDM, treatment plan, and plan for follow-up with patient. patient agrees with plan.  Meds ordered this encounter  Medications   ibuprofen (ADVIL) 600 MG tablet    Sig: Take 1 tablet (600 mg total) by mouth every 6 (six) hours as needed.    Dispense:  30 tablet    Refill:  0      *This clinic note was created using  Lobbyist. Therefore, there may be occasional mistakes despite careful proofreading.  ?    Melynda Ripple, MD 02/13/22 1032

## 2022-02-17 ENCOUNTER — Ambulatory Visit: Payer: Commercial Managed Care - PPO | Admitting: Sports Medicine

## 2022-02-17 DIAGNOSIS — S93492A Sprain of other ligament of left ankle, initial encounter: Secondary | ICD-10-CM

## 2022-02-17 DIAGNOSIS — G8929 Other chronic pain: Secondary | ICD-10-CM | POA: Insufficient documentation

## 2022-02-17 DIAGNOSIS — S93402A Sprain of unspecified ligament of left ankle, initial encounter: Secondary | ICD-10-CM | POA: Insufficient documentation

## 2022-02-17 NOTE — Progress Notes (Signed)
    Procedures performed today:    None.  Independent interpretation of notes and tests performed by another provider:   None.  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 She was unable to bear weight initially, she was seen in urgent care, x-rays were negative for fracture. Placed in ASO. Overall doing a lot better, she has tenderness ATFL and fifth metatarsal neck. Able to do partial weightbearing without pain. She will continue ibuprofen, continue ASO, partial weightbearing for a week followed by full weightbearing as tolerated. Home exercise given, return to see me in 2 weeks.    ____________________________________________ Gwen Her. Dianah Field, M.D., ABFM., CAQSM., AME. Primary Care and Sports Medicine [facility]  Adjunct Professor of [place] of [facility] of Medicine  Risk manager

## 2022-02-17 NOTE — Assessment & Plan Note (Signed)
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 She was unable to bear weight initially, she was seen in urgent care, x-rays were negative for fracture. Placed in ASO. Overall doing a lot better, she has tenderness ATFL and fifth metatarsal neck. Able to do partial weightbearing without pain. She will continue ibuprofen, continue ASO, partial weightbearing for a week followed by full weightbearing as tolerated. Home exercise given, return to see me in 2 weeks.

## 2022-02-22 ENCOUNTER — Other Ambulatory Visit: Payer: Self-pay

## 2022-02-22 ENCOUNTER — Other Ambulatory Visit (HOSPITAL_COMMUNITY): Payer: Self-pay

## 2022-02-23 ENCOUNTER — Other Ambulatory Visit: Payer: Self-pay

## 2022-03-03 ENCOUNTER — Ambulatory Visit: Payer: Commercial Managed Care - PPO | Admitting: Sports Medicine

## 2022-03-03 DIAGNOSIS — S93492D Sprain of other ligament of left ankle, subsequent encounter: Secondary | ICD-10-CM | POA: Diagnosis not present

## 2022-03-03 NOTE — Progress Notes (Signed)
    Procedures performed today:    None.  Independent interpretation of notes and tests performed by another provider:   None.  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 Seen in urgent care, x-rays negative for fracture, placed in an ASO, she has improved dramatically, no discrete areas of tenderness, she will continue home conditioning, she will wear her ASO when out and about, she does have a cruise coming up in August and I think she will be well-healed by then. She can see me on an as-needed basis.    ____________________________________________ Gwen Her. Dianah Field, M.D., ABFM., CAQSM., AME. Primary Care and Sports Medicine [facility]  Adjunct Professor of [place] of [facility] of Medicine  Risk manager

## 2022-03-03 NOTE — Assessment & Plan Note (Signed)
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 Seen in urgent care, x-rays negative for fracture, placed in an ASO, she has improved dramatically, no discrete areas of tenderness, she will continue home conditioning, she will wear her ASO when out and about, she does have a cruise coming up in August and I think she will be well-healed by then. She can see me on an as-needed basis.

## 2022-03-09 ENCOUNTER — Other Ambulatory Visit (HOSPITAL_COMMUNITY): Payer: Self-pay

## 2022-03-10 ENCOUNTER — Other Ambulatory Visit (HOSPITAL_BASED_OUTPATIENT_CLINIC_OR_DEPARTMENT_OTHER): Payer: Self-pay

## 2022-03-10 ENCOUNTER — Other Ambulatory Visit (HOSPITAL_COMMUNITY): Payer: Self-pay

## 2022-03-10 ENCOUNTER — Other Ambulatory Visit: Payer: Self-pay

## 2022-04-04 DIAGNOSIS — G250 Essential tremor: Secondary | ICD-10-CM | POA: Diagnosis not present

## 2022-04-05 ENCOUNTER — Other Ambulatory Visit: Payer: Self-pay

## 2022-04-05 ENCOUNTER — Other Ambulatory Visit (HOSPITAL_COMMUNITY): Payer: Self-pay

## 2022-04-05 MED ORDER — PRIMIDONE 50 MG PO TABS
ORAL_TABLET | ORAL | 3 refills | Status: AC
  Filled 2022-05-16 – 2022-06-14 (×2): qty 450, 90d supply, fill #0
  Filled 2022-09-19: qty 450, 90d supply, fill #1
  Filled 2022-12-15 – 2023-02-09 (×4): qty 450, 90d supply, fill #2

## 2022-04-05 MED ORDER — PROPRANOLOL HCL ER 60 MG PO CP24
60.0000 mg | ORAL_CAPSULE | Freq: Every day | ORAL | 3 refills | Status: DC
  Filled 2022-04-05: qty 90, 90d supply, fill #0
  Filled 2022-07-13 – 2022-10-17 (×3): qty 90, 90d supply, fill #1
  Filled 2023-01-12: qty 90, 90d supply, fill #2

## 2022-04-17 DIAGNOSIS — F418 Other specified anxiety disorders: Secondary | ICD-10-CM | POA: Diagnosis not present

## 2022-04-17 DIAGNOSIS — I700 Atherosclerosis of aorta: Secondary | ICD-10-CM | POA: Diagnosis not present

## 2022-04-17 DIAGNOSIS — E039 Hypothyroidism, unspecified: Secondary | ICD-10-CM | POA: Diagnosis not present

## 2022-04-17 DIAGNOSIS — R7301 Impaired fasting glucose: Secondary | ICD-10-CM | POA: Diagnosis not present

## 2022-04-17 DIAGNOSIS — I10 Essential (primary) hypertension: Secondary | ICD-10-CM | POA: Diagnosis not present

## 2022-04-17 DIAGNOSIS — R569 Unspecified convulsions: Secondary | ICD-10-CM | POA: Diagnosis not present

## 2022-04-17 DIAGNOSIS — K589 Irritable bowel syndrome without diarrhea: Secondary | ICD-10-CM | POA: Diagnosis not present

## 2022-04-17 DIAGNOSIS — E785 Hyperlipidemia, unspecified: Secondary | ICD-10-CM | POA: Diagnosis not present

## 2022-04-17 DIAGNOSIS — G250 Essential tremor: Secondary | ICD-10-CM | POA: Diagnosis not present

## 2022-04-17 DIAGNOSIS — M8580 Other specified disorders of bone density and structure, unspecified site: Secondary | ICD-10-CM | POA: Diagnosis not present

## 2022-04-21 ENCOUNTER — Encounter (INDEPENDENT_AMBULATORY_CARE_PROVIDER_SITE_OTHER): Payer: Commercial Managed Care - PPO | Admitting: Sports Medicine

## 2022-04-21 DIAGNOSIS — G8929 Other chronic pain: Secondary | ICD-10-CM | POA: Diagnosis not present

## 2022-04-21 DIAGNOSIS — M25572 Pain in left ankle and joints of left foot: Secondary | ICD-10-CM | POA: Diagnosis not present

## 2022-04-24 ENCOUNTER — Other Ambulatory Visit: Payer: Self-pay | Admitting: Neurology

## 2022-04-24 ENCOUNTER — Other Ambulatory Visit (HOSPITAL_COMMUNITY): Payer: Self-pay

## 2022-04-24 NOTE — Telephone Encounter (Signed)
I spent 5 total minutes of online digital evaluation and management services in this patient-initiated request for online care. 

## 2022-04-24 NOTE — Assessment & Plan Note (Signed)
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 For this reason we will proceed with MRI. Of note she does have a cruise coming up in August.

## 2022-04-25 ENCOUNTER — Other Ambulatory Visit: Payer: Self-pay

## 2022-04-25 MED ORDER — ZOLPIDEM TARTRATE 5 MG PO TABS
5.0000 mg | ORAL_TABLET | Freq: Every day | ORAL | 0 refills | Status: DC
  Filled 2022-04-25: qty 30, 30d supply, fill #0

## 2022-04-28 ENCOUNTER — Encounter (HOSPITAL_BASED_OUTPATIENT_CLINIC_OR_DEPARTMENT_OTHER): Payer: Self-pay

## 2022-04-29 ENCOUNTER — Ambulatory Visit (INDEPENDENT_AMBULATORY_CARE_PROVIDER_SITE_OTHER): Payer: Commercial Managed Care - PPO

## 2022-04-29 DIAGNOSIS — G8929 Other chronic pain: Secondary | ICD-10-CM

## 2022-04-29 DIAGNOSIS — M25572 Pain in left ankle and joints of left foot: Secondary | ICD-10-CM

## 2022-05-16 ENCOUNTER — Ambulatory Visit: Payer: Commercial Managed Care - PPO | Admitting: Sports Medicine

## 2022-05-16 ENCOUNTER — Other Ambulatory Visit (INDEPENDENT_AMBULATORY_CARE_PROVIDER_SITE_OTHER): Payer: Commercial Managed Care - PPO

## 2022-05-16 ENCOUNTER — Other Ambulatory Visit (HOSPITAL_COMMUNITY): Payer: Self-pay

## 2022-05-16 ENCOUNTER — Other Ambulatory Visit: Payer: Self-pay | Admitting: Neurology

## 2022-05-16 DIAGNOSIS — M25572 Pain in left ankle and joints of left foot: Secondary | ICD-10-CM

## 2022-05-16 DIAGNOSIS — G8929 Other chronic pain: Secondary | ICD-10-CM

## 2022-05-16 NOTE — Assessment & Plan Note (Signed)
Very pleasant 57 year old female, she is now approximately 14 to 15 weeks status post inversion injury while wearing some high heels, x-rays were negative for fracture, she never improved and spite of analgesics, physical therapy, activity modification, MRI ended up showing a joint effusion, no ligament injuries. We injected her ankle joint today, she did report immediate relief of the initial pain. I like to see her back in about 6 weeks, they do have a trip coming up in August, so if this works we may have to do another 1 prior to the trip.

## 2022-05-16 NOTE — Progress Notes (Signed)
    Procedures performed today:    Procedure: Real-time Ultrasound Guided injection of the left ankle Device: Samsung HS60  Verbal informed consent obtained.  Time-out conducted.  Noted no overlying erythema, induration, or other signs of local infection.  Skin prepped in a sterile fashion.  Local anesthesia: Topical Ethyl chloride.  With sterile technique and under real time ultrasound guidance: Synovitis noted, 1 cc Kenalog 40, 1 cc lidocaine, 1 cc bupivacaine injected easily Completed without difficulty  Advised to call if fevers/chills, erythema, induration, drainage, or persistent bleeding.  Images permanently stored and available for review in PACS.  Impression: Technically successful ultrasound guided injection.  Independent interpretation of notes and tests performed by another provider:   None.  Brief History, Exam, Impression, and Recommendations:    Chronic pain of left ankle Very pleasant 57 year old female, she is now approximately 14 to 15 weeks status post inversion injury while wearing some high heels, x-rays were negative for fracture, she never improved and spite of analgesics, physical therapy, activity modification, MRI ended up showing a joint effusion, no ligament injuries. We injected her ankle joint today, she did report immediate relief of the initial pain. I like to see her back in about 6 weeks, they do have a trip coming up in August, so if this works we may have to do another 1 prior to the trip.    ____________________________________________ Ihor Austin. Benjamin Stain, M.D., ABFM., CAQSM., AME. Primary Care and Sports Medicine [facility]  Adjunct Professor of Family Medicine  [place] [employer] [facility] of Medicine  Restaurant manager, fast food

## 2022-05-17 ENCOUNTER — Other Ambulatory Visit (HOSPITAL_COMMUNITY): Payer: Self-pay

## 2022-05-17 ENCOUNTER — Other Ambulatory Visit: Payer: Self-pay

## 2022-05-17 MED ORDER — ZOLPIDEM TARTRATE 5 MG PO TABS
5.0000 mg | ORAL_TABLET | Freq: Every day | ORAL | 5 refills | Status: DC
  Filled 2022-05-17 – 2022-05-22 (×2): qty 30, 30d supply, fill #0
  Filled 2022-06-14 – 2022-06-28 (×2): qty 30, 30d supply, fill #1

## 2022-05-19 ENCOUNTER — Other Ambulatory Visit (HOSPITAL_COMMUNITY): Payer: Self-pay

## 2022-05-19 MED ORDER — PRIMIDONE 50 MG PO TABS
ORAL_TABLET | ORAL | 5 refills | Status: AC
  Filled 2022-05-19 – 2023-03-01 (×3): qty 150, 30d supply, fill #0
  Filled 2023-04-13: qty 150, 30d supply, fill #1

## 2022-05-22 ENCOUNTER — Other Ambulatory Visit: Payer: Self-pay

## 2022-05-22 ENCOUNTER — Other Ambulatory Visit (HOSPITAL_COMMUNITY): Payer: Self-pay

## 2022-05-24 ENCOUNTER — Other Ambulatory Visit (HOSPITAL_COMMUNITY): Payer: Self-pay

## 2022-05-26 ENCOUNTER — Ambulatory Visit: Payer: 59 | Admitting: Neurology

## 2022-05-29 ENCOUNTER — Other Ambulatory Visit: Payer: Self-pay

## 2022-05-31 ENCOUNTER — Telehealth: Payer: Self-pay | Admitting: Neurology

## 2022-05-31 DIAGNOSIS — Z0279 Encounter for issue of other medical certificate: Secondary | ICD-10-CM

## 2022-05-31 NOTE — Telephone Encounter (Signed)
Pt called in wanting to see about her DMV paperwork that was turned in last week. She states the deadline is 06/04/22 and wants to make sure they will be done in time or if she needs to call the DMV?

## 2022-05-31 NOTE — Telephone Encounter (Signed)
Can you pls confirm that she has not had any seizures since our last visit? Will fill out form today. Thanks

## 2022-05-31 NOTE — Telephone Encounter (Signed)
Pt called no seizure since last appointment,

## 2022-06-14 ENCOUNTER — Other Ambulatory Visit (HOSPITAL_COMMUNITY): Payer: Self-pay

## 2022-06-15 ENCOUNTER — Other Ambulatory Visit: Payer: Self-pay

## 2022-06-15 ENCOUNTER — Other Ambulatory Visit (HOSPITAL_COMMUNITY): Payer: Self-pay

## 2022-06-27 ENCOUNTER — Ambulatory Visit: Payer: Commercial Managed Care - PPO | Admitting: Sports Medicine

## 2022-06-27 DIAGNOSIS — M25572 Pain in left ankle and joints of left foot: Secondary | ICD-10-CM | POA: Diagnosis not present

## 2022-06-27 DIAGNOSIS — G8929 Other chronic pain: Secondary | ICD-10-CM

## 2022-06-27 NOTE — Assessment & Plan Note (Signed)
This pleasant 57 year old female returns, she is doing a lot better after ankle joint injection, please see prior notes for further details, return as needed.

## 2022-06-27 NOTE — Progress Notes (Signed)
    Procedures performed today:    None.  Independent interpretation of notes and tests performed by another provider:   None.  Brief History, Exam, Impression, and Recommendations:    Chronic pain of left ankle This pleasant 57 year old female returns, she is doing a lot better after ankle joint injection, please see prior notes for further details, return as needed.    ____________________________________________ Ihor Austin. Benjamin Stain, M.D., ABFM., CAQSM., AME. Primary Care and Sports Medicine [facility]  Adjunct Professor of Family Medicine  [place] [employer] Medicine  Restaurant manager, fast food

## 2022-06-29 ENCOUNTER — Other Ambulatory Visit: Payer: Self-pay

## 2022-07-13 ENCOUNTER — Other Ambulatory Visit: Payer: Self-pay | Admitting: Neurology

## 2022-07-14 ENCOUNTER — Other Ambulatory Visit: Payer: Self-pay

## 2022-07-14 ENCOUNTER — Other Ambulatory Visit (HOSPITAL_COMMUNITY): Payer: Self-pay

## 2022-07-14 MED ORDER — CLONAZEPAM 0.5 MG PO TABS
ORAL_TABLET | ORAL | 5 refills | Status: DC
  Filled 2022-07-14: qty 10, 23d supply, fill #0

## 2022-07-21 ENCOUNTER — Ambulatory Visit: Payer: Commercial Managed Care - PPO | Admitting: Sports Medicine

## 2022-07-24 ENCOUNTER — Ambulatory Visit (INDEPENDENT_AMBULATORY_CARE_PROVIDER_SITE_OTHER): Payer: Commercial Managed Care - PPO | Admitting: Sports Medicine

## 2022-07-24 DIAGNOSIS — M25572 Pain in left ankle and joints of left foot: Secondary | ICD-10-CM | POA: Diagnosis not present

## 2022-07-24 DIAGNOSIS — G8929 Other chronic pain: Secondary | ICD-10-CM | POA: Diagnosis not present

## 2022-07-24 NOTE — Progress Notes (Signed)
    Procedures performed today:    None.  Independent interpretation of notes and tests performed by another provider:   None.  Brief History, Exam, Impression, and Recommendations:    Chronic pain of left ankle Alyssa Mccarthy returns, we treated her for chronic ankle pain at the last visit with a ankle joint injection, she did have an ankle joint effusion on MRI. She improved considerably with regards to her ankle joint pain but unfortunately is having a recurrence, she does have a trip to [place] coming up end of July, she will come back to see me about a week before the trip for repeat left ankle joint injection, it will be a few days too early but I am okay doing the shot at that point. In the meantime she will do topical Voltaren and meloxicam, home physical therapy.    ____________________________________________ Ihor Austin. Benjamin Stain, M.D., ABFM., CAQSM., AME. Primary Care and Sports Medicine  [facility]  Adjunct Professor of Family Medicine  [place] of [facility] of Medicine  Restaurant manager, fast food

## 2022-07-24 NOTE — Assessment & Plan Note (Signed)
Setareh returns, we treated her for chronic ankle pain at the last visit with a ankle joint injection, she did have an ankle joint effusion on MRI. She improved considerably with regards to her ankle joint pain but unfortunately is having a recurrence, she does have a trip to [place] coming up end of July, she will come back to see me about a week before the trip for repeat left ankle joint injection, it will be a few days too early but I am okay doing the shot at that point. In the meantime she will do topical Voltaren and meloxicam, home physical therapy.

## 2022-08-03 ENCOUNTER — Other Ambulatory Visit (HOSPITAL_COMMUNITY): Payer: Self-pay

## 2022-08-03 ENCOUNTER — Other Ambulatory Visit: Payer: Self-pay

## 2022-08-14 ENCOUNTER — Ambulatory Visit: Payer: Commercial Managed Care - PPO | Admitting: Sports Medicine

## 2022-08-14 ENCOUNTER — Other Ambulatory Visit: Payer: Self-pay

## 2022-08-15 ENCOUNTER — Ambulatory Visit: Payer: Commercial Managed Care - PPO | Admitting: Sports Medicine

## 2022-08-17 ENCOUNTER — Ambulatory Visit: Payer: Commercial Managed Care - PPO | Admitting: Sports Medicine

## 2022-08-17 ENCOUNTER — Other Ambulatory Visit (INDEPENDENT_AMBULATORY_CARE_PROVIDER_SITE_OTHER): Payer: Commercial Managed Care - PPO

## 2022-08-17 DIAGNOSIS — G8929 Other chronic pain: Secondary | ICD-10-CM

## 2022-08-17 DIAGNOSIS — M25572 Pain in left ankle and joints of left foot: Secondary | ICD-10-CM

## 2022-08-17 MED ORDER — TRIAMCINOLONE ACETONIDE 40 MG/ML IJ SUSP
40.0000 mg | Freq: Once | INTRAMUSCULAR | Status: AC
Start: 2022-08-17 — End: 2022-08-17
  Administered 2022-08-17: 40 mg via INTRAMUSCULAR

## 2022-08-17 NOTE — Assessment & Plan Note (Signed)
This is a very pleasant 57 year old female, she has chronic left ankle pain, last injected over 3 months ago, at that time she had an ankle joint effusion on MRI, she did extremely well, she does have a trip coming up to [place], a cruise. Due to recurrence of ankle pain we injected her ankle joint, she was also having some pain somewhat distally in the calcaneocuboid region, this was injected as well. Return to see me in 6 weeks as needed.

## 2022-08-17 NOTE — Addendum Note (Signed)
Addended by: Carren Rang A on: 08/17/2022 04:43 PM   Modules accepted: Orders

## 2022-08-17 NOTE — Progress Notes (Signed)
    Procedures performed today:    Procedure: Real-time Ultrasound Guided injection of the left tibiotalar joint Device: Samsung HS60  Verbal informed consent obtained.  Time-out conducted.  Noted no overlying erythema, induration, or other signs of local infection.  Skin prepped in a sterile fashion.  Local anesthesia: Topical Ethyl chloride.  With sterile technique and under real time ultrasound guidance: Minimal synovitis noted, 0.5 cc Kenalog 40, 0.5 cc lidocaine, 0.5 cc bupivacaine injected easily Completed without difficulty  Advised to call if fevers/chills, erythema, induration, drainage, or persistent bleeding.  Images permanently stored and available for review in PACS.  Impression: Technically successful ultrasound guided injection.  Procedure: Real-time Ultrasound Guided injection of the left calcaneocuboid joint Device: Samsung HS60  Verbal informed consent obtained.  Time-out conducted.  Noted no overlying erythema, induration, or other signs of local infection.  Skin prepped in a sterile fashion.  Local anesthesia: Topical Ethyl chloride.  With sterile technique and under real time ultrasound guidance: Minimal synovitis noted, 0.5 cc Kenalog 40, 0.5 cc lidocaine, 0.5 cc bupivacaine injected easily Completed without difficulty  Advised to call if fevers/chills, erythema, induration, drainage, or persistent bleeding.  Images permanently stored and available for review in PACS.  Impression: Technically successful ultrasound guided injection.  Independent interpretation of notes and tests performed by another provider:   None.  Brief History, Exam, Impression, and Recommendations:    Chronic pain of left ankle This is a very pleasant 57 year old female, she has chronic left ankle pain, last injected over 3 months ago, at that time she had an ankle joint effusion on MRI, she did extremely well, she does have a trip coming up to [place], a cruise. Due to recurrence of  ankle pain we injected her ankle joint, she was also having some pain somewhat distally in the calcaneocuboid region, this was injected as well. Return to see me in 6 weeks as needed.    ____________________________________________ Ihor Austin. Benjamin Stain, M.D., ABFM., CAQSM., AME. Primary Care and Sports Medicine [place] [facility]  Adjunct Professor of Family Medicine  [place] [employer] [facility] of Medicine  Restaurant manager, fast food

## 2022-09-01 ENCOUNTER — Ambulatory Visit: Payer: Self-pay | Admitting: Neurology

## 2022-09-06 ENCOUNTER — Other Ambulatory Visit (HOSPITAL_COMMUNITY): Payer: Self-pay

## 2022-09-11 DIAGNOSIS — I10 Essential (primary) hypertension: Secondary | ICD-10-CM | POA: Diagnosis not present

## 2022-09-11 DIAGNOSIS — E559 Vitamin D deficiency, unspecified: Secondary | ICD-10-CM | POA: Diagnosis not present

## 2022-09-11 DIAGNOSIS — E039 Hypothyroidism, unspecified: Secondary | ICD-10-CM | POA: Diagnosis not present

## 2022-09-11 DIAGNOSIS — R7301 Impaired fasting glucose: Secondary | ICD-10-CM | POA: Diagnosis not present

## 2022-09-11 DIAGNOSIS — E785 Hyperlipidemia, unspecified: Secondary | ICD-10-CM | POA: Diagnosis not present

## 2022-09-11 DIAGNOSIS — Z1212 Encounter for screening for malignant neoplasm of rectum: Secondary | ICD-10-CM | POA: Diagnosis not present

## 2022-09-13 ENCOUNTER — Ambulatory Visit (INDEPENDENT_AMBULATORY_CARE_PROVIDER_SITE_OTHER): Payer: Commercial Managed Care - PPO | Admitting: Neurology

## 2022-09-13 ENCOUNTER — Encounter: Payer: Self-pay | Admitting: Neurology

## 2022-09-13 ENCOUNTER — Other Ambulatory Visit (HOSPITAL_COMMUNITY): Payer: Self-pay

## 2022-09-13 ENCOUNTER — Other Ambulatory Visit: Payer: Self-pay

## 2022-09-13 VITALS — BP 121/66 | HR 55 | Ht 61.5 in | Wt 152.2 lb

## 2022-09-13 DIAGNOSIS — G4700 Insomnia, unspecified: Secondary | ICD-10-CM

## 2022-09-13 DIAGNOSIS — G40309 Generalized idiopathic epilepsy and epileptic syndromes, not intractable, without status epilepticus: Secondary | ICD-10-CM | POA: Diagnosis not present

## 2022-09-13 MED ORDER — LAMOTRIGINE 100 MG PO TABS
100.0000 mg | ORAL_TABLET | Freq: Two times a day (BID) | ORAL | 3 refills | Status: DC
  Filled 2022-09-13 – 2022-11-14 (×2): qty 180, 90d supply, fill #0
  Filled 2023-02-09: qty 180, 90d supply, fill #1
  Filled 2023-05-09: qty 180, 90d supply, fill #2

## 2022-09-13 MED ORDER — CLONAZEPAM 0.5 MG PO TABS
ORAL_TABLET | ORAL | 5 refills | Status: DC
  Filled 2022-09-13: qty 10, 23d supply, fill #0
  Filled 2022-11-14: qty 10, 23d supply, fill #1
  Filled 2022-11-30 – 2022-12-15 (×2): qty 10, 23d supply, fill #2
  Filled 2023-01-12: qty 10, 23d supply, fill #3

## 2022-09-13 MED ORDER — LEVETIRACETAM ER 500 MG PO TB24
2500.0000 mg | ORAL_TABLET | Freq: Every day | ORAL | 3 refills | Status: DC
  Filled 2022-09-13 – 2022-11-30 (×2): qty 450, 90d supply, fill #0
  Filled 2023-03-03: qty 450, 90d supply, fill #1
  Filled 2023-05-31: qty 450, 90d supply, fill #2
  Filled 2023-08-31: qty 450, 90d supply, fill #3
  Filled 2023-09-01: qty 150, 30d supply, fill #3

## 2022-09-13 NOTE — Patient Instructions (Signed)
Always good to see you.  Continue all your medications  2. Referral will be sent to Dr. Frances Furbish for sleep evaluation  3. Follow-up in 1 year, call for any changes   Seizure Precautions: 1. If medication has been prescribed for you to prevent seizures, take it exactly as directed.  Do not stop taking the medicine without talking to your doctor first, even if you have not had a seizure in a long time.   2. Avoid activities in which a seizure would cause danger to yourself or to others.  Don't operate dangerous machinery, swim alone, or climb in high or dangerous places, such as on ladders, roofs, or girders.  Do not drive unless your doctor says you may.  3. If you have any warning that you may have a seizure, lay down in a safe place where you can't hurt yourself.    4.  No driving for 6 months from last seizure, as per [facility].   Please refer to the following link on the Epilepsy Foundation of America's website for more information: http://www.epilepsyfoundation.org/answerplace/Social/driving/drivingu.cfm   5.  Maintain good sleep hygiene. Avoid alcohol.  6.  Contact your doctor if you have any problems that may be related to the medicine you are taking.  7.  Call 911 and bring the patient back to the ED if:        A.  The seizure lasts longer than 5 minutes.       B.  The patient doesn't awaken shortly after the seizure  C.  The patient has new problems such as difficulty seeing, speaking or moving  D.  The patient was injured during the seizure  E.  The patient has a temperature over 102 F (39C)  F.  The patient vomited and now is having trouble breathing

## 2022-09-13 NOTE — Progress Notes (Signed)
NEUROLOGY FOLLOW UP OFFICE NOTE  Alyssa Mccarthy [id number] 1965-11-19  HISTORY OF PRESENT ILLNESS: I had the pleasure of seeing Alyssa Mccarthy in follow-up in the neurology clinic on 09/13/2022.  The patient was last seen 8 months ago for seizures. She is alone in the office today. Records and images were personally reviewed where available.  She continues to follow-up with Dr. Arlana Pouch at [facility] for ET, on Primidone. She denies any seizures or seizure-like symptoms since May 2021 on Levetiracetam ER 2500mg  daily and Lamictal 100mg  BID, no side effects. She denies any staring/unresponsive episodes, gaps in time, olfactory/gustatory hallucinations, focal numbness/tingling/weakness, myoclonic jerks. No headaches, dizziness, vision changes. She has taken the clonazepam once or twice when she went a few days without sleep, which can trigger her seizures. She continues to have difficulty with sleep and had previously seen Dr. Frances Furbish but unable to follow-up. She has stopped the Ambien because it has not helped. She has had some headaches this week that she attributes to the season, Advil helps. She continues to note left hand paresthesias, she could not find an elbow brace but has found that when she keeps her arm straight, the symptoms are less. She injured her left ankle while dancing in January but has not had any falls. Mood is good.    History on Initial Assessment 01/29/2017: This is a pleasant 57 yo RH woman with a history of idiopathic generalized epilepsy, essential tremor s/p VIM DBS, presenting to establish care. She had been seeing epileptologist Dr. Modesto Charon at [facility], records were reviewed. She sees Movement Disorder specialist Dr. Arlana Pouch for the essential tremor. Seizures started around age 25. She reports seizures were well-controlled for 25 years, then after she had her daughter, she was having more seizures for 3-4 years, then overall has been well-controlled the past 10 years. She has no clear aura, describes  body jerking that progresses to a generalized convulsion. Flashing lights and sleep deprivation are seizure triggers. She has not had any seizures since around 2015 when Depakote was re-added to her regimen. She is currently on Lamictal 100mg  BID, Keppra XR 2500mg  daily, and Depakote ER 500mg  daily with no side effects. She had been on Depakote for many years in the past but had weight gain, it was stopped, then restarted around 4 years ago. She is also on Primidone for the tremor. She denies any olfactory/gustatory hallucinations, deja vu, rising epigastric sensation, focal numbness/tingling/weakness, myoclonic jerks. She rarely has a funny feeling that "something is not quite right" and would take a prn clonazepam as a precaution, she has not needed this in about a year.    She denies any headaches, dizziness, diplopia, dysarthria/dysphagia, neck/back pain, bowel/bladder dysfunction. Sleep is good. She had been taking Zoloft increased dose 4 years ago due to irritability, improved with higher dose. She was diagnosed with a stroke in January 2018 when she had a bad headache and anisocoria. MRI brain showed subtle T2 hyperintensity in the pons, R>L. She saw Dr. Theone Murdoch who confirmed a left Horner's. She has chronic mild right-sided sensory changes since then. She has also had symptoms every once in a while where there is something she can't see (not clearly on peripheral vision), she blinks and vision is back to normal. She has chronic numbness on the left elbow region. She has noticed she has no interest in eating, but makes herself eat. 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 She is driving.    Diagnostic Data: MRI  brain in March 2011 reported as normal. EEG in March 2010 abnormal due to generalized polyspike and wave, consistent with a primary generalized epilepsy Prior AEDs: Phenobarbital, Zonisamide, Dilantin. On a different generic Levetiracetam, she had dry hands and peeling in fingertips,  which resolved after switching to a different generic   Epilepsy Risk Factors:  There is a strong family history of seizures in her paternal grandfather, father, and daughter. Otherwise she had a normal birth and early development.  There is no history of febrile convulsions, CNS infections such as meningitis/encephalitis, significant traumatic brain injury, neurosurgical procedures.  PAST MEDICAL HISTORY: Past Medical History:  Diagnosis Date   Complication of anesthesia    Essential tremor    Hyperlipidemia    Hypothyroidism    PONV (postoperative nausea and vomiting)    Seizure disorder (HCC)    Stroke [facility])     MEDICATIONS: Current Outpatient Medications on File Prior to Visit  Medication Sig Dispense Refill   albuterol (VENTOLIN HFA) 108 (90 Base) MCG/ACT inhaler Inhale 2 puffs into the lungs 2 times a day for 4 days as needed then use 2 puffs 2 times a day as needed then wean after that 6.7 g 3   amLODipine (NORVASC) 5 MG tablet Take 1 tablet (5 mg total) by mouth daily. 90 tablet 3   aspirin EC 325 MG EC tablet Take 1 tablet (325 mg total) by mouth daily. 30 tablet 1   atorvastatin (LIPITOR) 40 MG tablet Take 1 tablet (40 mg total) by mouth daily. (Patient taking differently: Take 40 mg by mouth once a week. Every Monday) 90 tablet 4   clonazePAM (KLONOPIN) 0.5 MG tablet TAKE 1 TABLET BY MOUTH ONCE A DAY AS NEEDED FOR SEIZURE (DONT USE MORE THAN 2-3 A WEEK) 10 tablet 5   ezetimibe (ZETIA) 10 MG tablet Take 10 mg by mouth daily.     ibuprofen (ADVIL) 600 MG tablet Take 1 tablet (600 mg total) by mouth every 6 (six) hours as needed. 30 tablet 0   lamoTRIgine (LAMICTAL) 100 MG tablet Take 1 tablet (100 mg total) by mouth 2 (two) times daily. 180 tablet 3   levETIRAcetam (KEPPRA XR) 500 MG 24 hr tablet Take 5 tablets (2,500 mg total) by mouth daily as directed 450 tablet 3   levothyroxine (SYNTHROID) 100 MCG tablet Take 1 tablet (100 mcg total) by mouth in the morning on an empty  stomach 90 tablet 3   Melatonin 10 MG TABS Take 10 mg by mouth as needed (sleep). 10-20 mg for sleep     metoprolol succinate (TOPROL-XL) 25 MG 24 hr tablet Take 25 mg by mouth daily.     Multiple Vitamin (MULTIVITAMIN) tablet Take 1 tablet by mouth daily.     primidone (MYSOLINE) 50 MG tablet Take 2 tablets (100 mg total) by mouth in the morning, THEN 3 tablets (150 mg total) at night 450 tablet 0   propranolol ER (INDERAL LA) 60 MG 24 hr capsule TAKE 1 CAPSULE BY MOUTH ONCE DAILY 30 capsule 11   zolpidem (AMBIEN) 5 MG tablet Take 1 tablet (5 mg total) by mouth at bedtime. 30 tablet 5   amLODipine (NORVASC) 5 MG tablet Take 1 tablet (5 mg total) by mouth daily. 90 tablet 3   ezetimibe (ZETIA) 10 MG tablet Take 1 tablet (10 mg total) by mouth daily. 90 tablet 4   primidone (MYSOLINE) 50 MG tablet Take 2 tablets (100 mg total) by mouth every morning AND 3 tablets (150 mg  total) at bedtime. 450 tablet 3   primidone (MYSOLINE) 50 MG tablet Take 2 tablets (100 mg total) by mouth in the morning AND 3 tablets (150 mg total) every evening. 450 tablet 3   primidone (MYSOLINE) 50 MG tablet Take 2 tablets (100 mg total) by mouth in the morning. Then take 3 tablets (150 mg total) by mouth every evening. 150 tablet 5   propranolol ER (INDERAL LA) 60 MG 24 hr capsule Take 1 capsule (60 mg total) by mouth daily. 90 capsule 3   propranolol ER (INDERAL LA) 60 MG 24 hr capsule Take 1 capsule (60 mg total) by mouth daily. 90 capsule 3   No current facility-administered medications on file prior to visit.    ALLERGIES: Allergies  Allergen Reactions   Other Other (See Comments)    Bleach-respiratory distress   Plavix [Clopidogrel Bisulfate] Itching, Rash and Other (See Comments)    Hands and feet    Protonix [Pantoprazole] Itching and Other (See Comments)    Whelps   Penicillins Itching, Rash and Other (See Comments)    Childhood allergy- specifics unknown; tolerated amoxicillin and ancef since then      FAMILY HISTORY: Family History  Problem Relation Age of Onset   Breast cancer Mother    Heart attack Father 58   Stroke Neg Hx     SOCIAL HISTORY: Social History   Socioeconomic History   Marital status: Married    Spouse name: Not on file   Number of children: Not on file   Years of education: Not on file   Highest education level: Not on file  Occupational History   Not on file  Tobacco Use   Smoking status: Never   Smokeless tobacco: Never  Vaping Use   Vaping status: Never Used  Substance and Sexual Activity   Alcohol use: Yes    Comment: socially   Drug use: No   Sexual activity: Not on file  Other Topics Concern   Not on file  Social History Narrative   Lives in 1 story home with her husband and 2 sons   Has 2 adult sons   Has bach. Degree   Works as Investment banker, operational   Right handed   Social Determinants of Health   Financial Resource Strain: Not on file  Food Insecurity: Not on file  Transportation Needs: Not on file  Physical Activity: Not on file  Stress: Not on file  Social Connections: Not on file  Intimate Partner Violence: Not on file     PHYSICAL EXAM: Vitals:   09/13/22 1434  BP: 121/66  Pulse: (!) 55  SpO2: 98%   General: No acute distress Head:  Normocephalic/atraumatic Skin/Extremities: No rash, no edema Neurological Exam: alert and awake. No aphasia or dysarthria. Fund of knowledge is appropriate.  Attention and concentration are normal.   Cranial nerves: Pupils equal, round. Extraocular movements intact with no nystagmus. Visual fields full.  No facial asymmetry.  Motor: Bulk and tone normal, muscle strength 5/5 throughout with no pronator drift.   Finger to nose testing intact.  Gait narrow-based and steady, able to tandem walk adequately.  Romberg negative. Mild action tremor on left finger to nose testing. No resting or postural tremor today.    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 She continues to do well seizure-free since May 2021 on Levetiracetam  ER 2500mg  daily and Lamotrigine 100mg  BID, refills sent. She is also on Primidone for ET. She has prn clonazepam for breakthrough seizures. She continues to report sleep difficulties and would like to follow-up with Dr. Frances Furbish as previously discussed. She has stopped the Ambien. She is aware of [place] driving laws to stop driving after a seizure until 6 months seizure-free. Follow-up in 1 year, call for any changes.    Thank you for allowing me to participate in her care.  Please do not hesitate to call for any questions or concerns.   Patrcia Dolly, M.D.   CC: Dr. Evlyn Kanner

## 2022-09-14 ENCOUNTER — Other Ambulatory Visit: Payer: Self-pay

## 2022-09-19 ENCOUNTER — Other Ambulatory Visit (HOSPITAL_COMMUNITY): Payer: Self-pay

## 2022-09-20 ENCOUNTER — Other Ambulatory Visit (HOSPITAL_COMMUNITY): Payer: Self-pay

## 2022-09-20 ENCOUNTER — Other Ambulatory Visit: Payer: Self-pay

## 2022-09-20 DIAGNOSIS — E785 Hyperlipidemia, unspecified: Secondary | ICD-10-CM | POA: Diagnosis not present

## 2022-09-20 DIAGNOSIS — M25572 Pain in left ankle and joints of left foot: Secondary | ICD-10-CM | POA: Diagnosis not present

## 2022-09-20 DIAGNOSIS — R569 Unspecified convulsions: Secondary | ICD-10-CM | POA: Diagnosis not present

## 2022-09-20 DIAGNOSIS — Z0000 Encounter for general adult medical examination without abnormal findings: Secondary | ICD-10-CM | POA: Diagnosis not present

## 2022-09-20 DIAGNOSIS — G250 Essential tremor: Secondary | ICD-10-CM | POA: Diagnosis not present

## 2022-09-20 DIAGNOSIS — F418 Other specified anxiety disorders: Secondary | ICD-10-CM | POA: Diagnosis not present

## 2022-09-20 DIAGNOSIS — E039 Hypothyroidism, unspecified: Secondary | ICD-10-CM | POA: Diagnosis not present

## 2022-09-20 DIAGNOSIS — R7301 Impaired fasting glucose: Secondary | ICD-10-CM | POA: Diagnosis not present

## 2022-09-20 DIAGNOSIS — I10 Essential (primary) hypertension: Secondary | ICD-10-CM | POA: Diagnosis not present

## 2022-09-20 DIAGNOSIS — I700 Atherosclerosis of aorta: Secondary | ICD-10-CM | POA: Diagnosis not present

## 2022-09-20 DIAGNOSIS — R82998 Other abnormal findings in urine: Secondary | ICD-10-CM | POA: Diagnosis not present

## 2022-10-11 ENCOUNTER — Other Ambulatory Visit (HOSPITAL_COMMUNITY): Payer: Self-pay

## 2022-10-12 ENCOUNTER — Other Ambulatory Visit: Payer: Self-pay

## 2022-10-17 ENCOUNTER — Other Ambulatory Visit (HOSPITAL_COMMUNITY): Payer: Self-pay

## 2022-10-18 ENCOUNTER — Other Ambulatory Visit: Payer: Self-pay

## 2022-10-27 ENCOUNTER — Other Ambulatory Visit: Payer: Self-pay

## 2022-10-27 ENCOUNTER — Other Ambulatory Visit (HOSPITAL_COMMUNITY): Payer: Self-pay

## 2022-10-27 MED ORDER — EZETIMIBE 10 MG PO TABS
10.0000 mg | ORAL_TABLET | Freq: Every day | ORAL | 4 refills | Status: DC
  Filled 2022-10-27: qty 90, 90d supply, fill #0
  Filled 2023-02-09: qty 90, 90d supply, fill #1
  Filled 2023-05-10: qty 90, 90d supply, fill #2
  Filled 2023-08-06: qty 90, 90d supply, fill #3

## 2022-11-08 ENCOUNTER — Other Ambulatory Visit (HOSPITAL_COMMUNITY): Payer: Self-pay

## 2022-11-08 MED ORDER — INFLUENZA VIRUS VACC SPLIT PF (FLUZONE) 0.5 ML IM SUSY
0.5000 mL | PREFILLED_SYRINGE | Freq: Once | INTRAMUSCULAR | 0 refills | Status: AC
  Filled 2022-11-08: qty 0.5, 1d supply, fill #0

## 2022-11-14 ENCOUNTER — Other Ambulatory Visit (HOSPITAL_COMMUNITY): Payer: Self-pay

## 2022-11-15 ENCOUNTER — Other Ambulatory Visit: Payer: Self-pay

## 2022-11-30 ENCOUNTER — Other Ambulatory Visit (HOSPITAL_COMMUNITY): Payer: Self-pay

## 2022-12-01 ENCOUNTER — Other Ambulatory Visit (HOSPITAL_COMMUNITY): Payer: Self-pay

## 2022-12-01 ENCOUNTER — Other Ambulatory Visit: Payer: Self-pay

## 2022-12-01 MED ORDER — LEVOTHYROXINE SODIUM 100 MCG PO TABS
100.0000 ug | ORAL_TABLET | Freq: Every morning | ORAL | 3 refills | Status: DC
  Filled 2022-12-01: qty 90, 90d supply, fill #0
  Filled 2023-03-18: qty 90, 90d supply, fill #1
  Filled 2023-06-12: qty 90, 90d supply, fill #2
  Filled 2023-09-24: qty 90, 90d supply, fill #3

## 2022-12-12 ENCOUNTER — Other Ambulatory Visit (HOSPITAL_COMMUNITY): Payer: Self-pay

## 2022-12-16 ENCOUNTER — Other Ambulatory Visit (HOSPITAL_COMMUNITY): Payer: Self-pay

## 2022-12-18 ENCOUNTER — Other Ambulatory Visit (HOSPITAL_COMMUNITY): Payer: Self-pay

## 2022-12-18 ENCOUNTER — Other Ambulatory Visit: Payer: Self-pay

## 2022-12-19 ENCOUNTER — Other Ambulatory Visit: Payer: Self-pay

## 2022-12-19 ENCOUNTER — Other Ambulatory Visit (HOSPITAL_COMMUNITY): Payer: Self-pay

## 2023-01-02 ENCOUNTER — Ambulatory Visit: Payer: Commercial Managed Care - PPO | Admitting: Sports Medicine

## 2023-01-02 DIAGNOSIS — G5622 Lesion of ulnar nerve, left upper limb: Secondary | ICD-10-CM

## 2023-01-02 NOTE — Progress Notes (Signed)
    Procedures performed today:    None.  Independent interpretation of notes and tests performed by another provider:   None.  Brief History, Exam, Impression, and Recommendations:    Cubital tunnel syndrome, left Very pleasant 57 year old female, she has had several months of increasing discomfort left medial elbow worse at night, worse when bending the elbow and resulting in paresthesias fourth and fifth fingers, right elbow is fine. On exam she has a positive cubital tunnel Tinel's sign, we discussed the anatomy and pathophysiology, we will try nocturnal splinting, ulnar nerve mobilization exercises, return to see me in 6 weeks, ulnar nerve hydrodissection if not better.    ____________________________________________ Ihor Austin. Benjamin Stain, M.D., ABFM., CAQSM., AME. Primary Care and Sports Medicine [facility]  Adjunct Professor of Family Medicine  [employer]  Restaurant manager, fast food

## 2023-01-02 NOTE — Assessment & Plan Note (Signed)
Very pleasant 57 year old female, she has had several months of increasing discomfort left medial elbow worse at night, worse when bending the elbow and resulting in paresthesias fourth and fifth fingers, right elbow is fine. On exam she has a positive cubital tunnel Tinel's sign, we discussed the anatomy and pathophysiology, we will try nocturnal splinting, ulnar nerve mobilization exercises, return to see me in 6 weeks, ulnar nerve hydrodissection if not better.

## 2023-01-12 ENCOUNTER — Other Ambulatory Visit (HOSPITAL_COMMUNITY): Payer: Self-pay

## 2023-01-13 ENCOUNTER — Other Ambulatory Visit (HOSPITAL_COMMUNITY): Payer: Self-pay

## 2023-01-13 MED ORDER — ATORVASTATIN CALCIUM 40 MG PO TABS
40.0000 mg | ORAL_TABLET | Freq: Every day | ORAL | 4 refills | Status: AC
  Filled 2023-01-13: qty 90, 90d supply, fill #0

## 2023-02-08 ENCOUNTER — Encounter: Payer: Self-pay | Admitting: Neurology

## 2023-02-09 ENCOUNTER — Other Ambulatory Visit: Payer: Self-pay

## 2023-02-09 ENCOUNTER — Other Ambulatory Visit (HOSPITAL_COMMUNITY): Payer: Self-pay

## 2023-02-09 MED ORDER — AMLODIPINE BESYLATE 5 MG PO TABS
5.0000 mg | ORAL_TABLET | Freq: Every day | ORAL | 3 refills | Status: DC
  Filled 2023-02-09: qty 90, 90d supply, fill #0
  Filled 2023-05-11: qty 90, 90d supply, fill #1
  Filled 2023-08-06: qty 90, 90d supply, fill #2
  Filled 2023-11-02: qty 90, 90d supply, fill #3

## 2023-02-13 ENCOUNTER — Ambulatory Visit: Payer: Commercial Managed Care - PPO | Admitting: Sports Medicine

## 2023-02-13 DIAGNOSIS — G5622 Lesion of ulnar nerve, left upper limb: Secondary | ICD-10-CM

## 2023-02-13 NOTE — Assessment & Plan Note (Signed)
Very pleasant 58 year old female, she had several months of increasing discomfort left sided medial elbow worse at night worse with bending of the elbow and resulting in paresthesias fourth and fifth digits Right side was fine, at the last visit on exam she had a positive cubital tunnel Tinel's sign, we suspected cubital tunnel syndrome and added a nighttime ulnar neuropathy brace, home PT, she returns today with symptoms resolved, continue bracing at night, home physical therapy, return as needed. If worsening of symptoms we can consider a cubital tunnel ulnar nerve hydrodissection with ultrasound guidance.

## 2023-02-13 NOTE — Progress Notes (Signed)
    Procedures performed today:    None.  Independent interpretation of notes and tests performed by another provider:   None.  Brief History, Exam, Impression, and Recommendations:    Cubital tunnel syndrome, left Very pleasant 58 year old female, she had several months of increasing discomfort left sided medial elbow worse at night worse with bending of the elbow and resulting in paresthesias fourth and fifth digits Right side was fine, at the last visit on exam she had a positive cubital tunnel Tinel's sign, we suspected cubital tunnel syndrome and added a nighttime ulnar neuropathy brace, home PT, she returns today with symptoms resolved, continue bracing at night, home physical therapy, return as needed. If worsening of symptoms we can consider a cubital tunnel ulnar nerve hydrodissection with ultrasound guidance.    ____________________________________________ Ihor Austin. Benjamin Stain, M.D., ABFM., CAQSM., AME. Primary Care and Sports Medicine [facility]  Adjunct Professor of Family Medicine  [employer]  Restaurant manager, fast food

## 2023-02-28 DIAGNOSIS — D126 Benign neoplasm of colon, unspecified: Secondary | ICD-10-CM | POA: Diagnosis not present

## 2023-02-28 DIAGNOSIS — E039 Hypothyroidism, unspecified: Secondary | ICD-10-CM | POA: Diagnosis not present

## 2023-02-28 DIAGNOSIS — E559 Vitamin D deficiency, unspecified: Secondary | ICD-10-CM | POA: Diagnosis not present

## 2023-02-28 DIAGNOSIS — E785 Hyperlipidemia, unspecified: Secondary | ICD-10-CM | POA: Diagnosis not present

## 2023-02-28 DIAGNOSIS — I700 Atherosclerosis of aorta: Secondary | ICD-10-CM | POA: Diagnosis not present

## 2023-02-28 DIAGNOSIS — G250 Essential tremor: Secondary | ICD-10-CM | POA: Diagnosis not present

## 2023-02-28 DIAGNOSIS — F418 Other specified anxiety disorders: Secondary | ICD-10-CM | POA: Diagnosis not present

## 2023-02-28 DIAGNOSIS — R7301 Impaired fasting glucose: Secondary | ICD-10-CM | POA: Diagnosis not present

## 2023-02-28 DIAGNOSIS — G8929 Other chronic pain: Secondary | ICD-10-CM | POA: Diagnosis not present

## 2023-02-28 DIAGNOSIS — I10 Essential (primary) hypertension: Secondary | ICD-10-CM | POA: Diagnosis not present

## 2023-03-01 ENCOUNTER — Other Ambulatory Visit (HOSPITAL_COMMUNITY): Payer: Self-pay

## 2023-03-03 ENCOUNTER — Other Ambulatory Visit (HOSPITAL_COMMUNITY): Payer: Self-pay

## 2023-03-06 ENCOUNTER — Encounter: Payer: Self-pay | Admitting: Neurology

## 2023-03-07 ENCOUNTER — Other Ambulatory Visit: Payer: Self-pay

## 2023-03-07 DIAGNOSIS — G902 Horner's syndrome: Secondary | ICD-10-CM

## 2023-03-19 ENCOUNTER — Other Ambulatory Visit (HOSPITAL_COMMUNITY): Payer: Self-pay

## 2023-03-23 ENCOUNTER — Other Ambulatory Visit: Payer: Self-pay | Admitting: Endocrinology

## 2023-03-23 DIAGNOSIS — Z1231 Encounter for screening mammogram for malignant neoplasm of breast: Secondary | ICD-10-CM

## 2023-03-28 ENCOUNTER — Ambulatory Visit
Admission: RE | Admit: 2023-03-28 | Discharge: 2023-03-28 | Disposition: A | Discharge status: Home / Self Care | Payer: Commercial Managed Care - PPO | Source: Ambulatory Visit | Attending: Endocrinology | Admitting: Endocrinology

## 2023-03-28 DIAGNOSIS — Z1231 Encounter for screening mammogram for malignant neoplasm of breast: Secondary | ICD-10-CM

## 2023-04-03 ENCOUNTER — Telehealth: Payer: Self-pay | Admitting: Neurology

## 2023-04-03 NOTE — Telephone Encounter (Signed)
 Pt is saying that the referral was not rcvd by office,  Referred To Provider: Norton Blizzard  Fax#[phone]

## 2023-04-03 NOTE — Telephone Encounter (Signed)
 Referral re faxed.

## 2023-04-13 ENCOUNTER — Other Ambulatory Visit: Payer: Self-pay | Admitting: Neurology

## 2023-04-13 ENCOUNTER — Other Ambulatory Visit (HOSPITAL_COMMUNITY): Payer: Self-pay

## 2023-04-16 ENCOUNTER — Other Ambulatory Visit (HOSPITAL_COMMUNITY): Payer: Self-pay

## 2023-04-16 ENCOUNTER — Other Ambulatory Visit: Payer: Self-pay

## 2023-04-16 MED ORDER — PROPRANOLOL HCL ER 60 MG PO CP24
60.0000 mg | ORAL_CAPSULE | Freq: Every day | ORAL | 3 refills | Status: AC
  Filled 2023-04-16: qty 90, 90d supply, fill #0
  Filled 2023-07-11: qty 90, 90d supply, fill #1
  Filled 2023-09-24 – 2023-09-25 (×2): qty 90, 90d supply, fill #2
  Filled 2024-01-07: qty 90, 90d supply, fill #3

## 2023-04-16 MED ORDER — CLONAZEPAM 0.5 MG PO TABS
ORAL_TABLET | ORAL | 5 refills | Status: AC
  Filled 2023-04-16: qty 10, 23d supply, fill #0
  Filled 2023-05-09: qty 10, 23d supply, fill #1
  Filled 2023-07-11: qty 10, 23d supply, fill #2
  Filled 2023-08-06: qty 10, 23d supply, fill #3

## 2023-05-09 ENCOUNTER — Other Ambulatory Visit (HOSPITAL_COMMUNITY): Payer: Self-pay

## 2023-05-10 ENCOUNTER — Other Ambulatory Visit (HOSPITAL_COMMUNITY): Payer: Self-pay

## 2023-05-10 ENCOUNTER — Other Ambulatory Visit: Payer: Self-pay

## 2023-05-10 MED ORDER — PRIMIDONE 50 MG PO TABS
ORAL_TABLET | ORAL | 3 refills | Status: AC
  Filled 2023-05-10: qty 450, 90d supply, fill #0
  Filled 2023-08-06: qty 450, 90d supply, fill #1
  Filled 2023-12-29 – 2024-02-11 (×4): qty 450, 90d supply, fill #2

## 2023-05-11 ENCOUNTER — Other Ambulatory Visit (HOSPITAL_COMMUNITY): Payer: Self-pay

## 2023-05-14 ENCOUNTER — Other Ambulatory Visit (HOSPITAL_COMMUNITY): Payer: Self-pay

## 2023-05-15 ENCOUNTER — Other Ambulatory Visit (HOSPITAL_COMMUNITY): Payer: Self-pay

## 2023-05-15 MED ORDER — AZITHROMYCIN 250 MG PO TABS
ORAL_TABLET | ORAL | 0 refills | Status: AC
Start: 2023-05-15 — End: ?
  Filled 2023-05-15: qty 6, 5d supply, fill #0

## 2023-05-16 DIAGNOSIS — G902 Horner's syndrome: Secondary | ICD-10-CM | POA: Diagnosis not present

## 2023-05-22 DIAGNOSIS — R058 Other specified cough: Secondary | ICD-10-CM | POA: Diagnosis not present

## 2023-05-22 DIAGNOSIS — J9801 Acute bronchospasm: Secondary | ICD-10-CM | POA: Diagnosis not present

## 2023-05-31 ENCOUNTER — Other Ambulatory Visit (HOSPITAL_COMMUNITY): Payer: Self-pay

## 2023-06-12 ENCOUNTER — Other Ambulatory Visit (HOSPITAL_COMMUNITY): Payer: Self-pay

## 2023-06-19 ENCOUNTER — Encounter: Attending: Endocrinology | Admitting: Skilled Nursing Facility1

## 2023-06-19 ENCOUNTER — Encounter: Payer: Self-pay | Admitting: Skilled Nursing Facility1

## 2023-06-19 DIAGNOSIS — E631 Imbalance of constituents of food intake: Secondary | ICD-10-CM | POA: Insufficient documentation

## 2023-06-19 NOTE — Progress Notes (Unsigned)
 Medical Nutrition Therapy  Appointment Start time:  4:38  Appointment End time:  5:38  Primary concerns today: to lose weight  Referral diagnosis: [employer] employee   NUTRITION ASSESSMENT    Clinical Medical Hx:  Medications: Labs:  Notable Signs/Symptoms:   Lifestyle & Dietary Hx  Pt states she has been doing keto for the last year and a half but is struggling to lose more weight stating she is 153 pounds having lost about 20 pounds. Pt states she sees where her variety in food including fruit and vegetables has declined due to keto.  Pt states she does not have a strong appetite.   Pt states she will eat eggs but hates them because they are keto. All meats and berries are okay to eat on her current diet as well spinach and broccoli.   Pt states she recognizes activity has to be a part of a new healthy lifestyle.    Estimated daily fluid intake:  oz Supplements:  Sleep:  Stress / self-care:  Current average weekly physical activity: ADL's, gardening   24-Hr Dietary Recall First Meal:  Snack:  Second Meal:  Snack:  Third Meal:  Snack:  Beverages:    NUTRITION INTERVENTION  Nutrition education (E-1) on the following topics:  Creation of balanced and diverse meals to increase the intake of nutrient-rich foods that provide essential vitamins, minerals, fiber, and phytonutrients Variety of Fruits and Vegetables: Aim for a colorful array of fruits and vegetables to ensure a wide range of nutrients. Include a mix of leafy greens, berries, citrus fruits, cruciferous vegetables, and more. Whole Grains: Choose whole grains over refined grains. Examples include Miklas rice, quinoa, oats, whole wheat, and barley. Lean Proteins: Include lean sources of protein, such as poultry, fish, tofu, legumes, beans, lentils, and low-fat dairy products. Limit red and processed meats. Healthy Fats: Incorporate sources of healthy fats, including avocados, nuts, seeds, and olive  oil. Limit saturated and trans fats found in fried and processed foods. Dairy or Dairy Alternatives: Choose low-fat or fat-free dairy products, or plant-based alternatives like almond or soy milk. Portion Control: Be mindful of portion sizes to avoid overeating. Pay attention to hunger and satisfaction cues. Limit Added Sugars: Minimize the consumption of sugary beverages, snacks, and desserts. Check food labels for added sugars and opt for natural sources of sweetness such as whole fruits. Hydration: Drink plenty of water throughout the day. Limit sugary drinks and excessive caffeine intake. Moderate Sodium Intake: Reduce the consumption of high-sodium foods. Use herbs and spices for flavor instead of excessive salt. Meal Planning and Preparation: Plan and prepare meals ahead of time to make healthier choices more convenient. Include a mix of food groups in each meal. Limit Processed Foods: Minimize the intake of highly processed and packaged foods that are often high in added sugars, salt, and unhealthy fats. Regular Physical Activity: Combine a healthy diet with regular physical activity for overall well-being. Aim for at least 150 minutes of moderate-intensity aerobic exercise per week, along with strength training. Moderation and Balance: Enjoy treats and indulgent foods in moderation, emphasizing balance rather than strict restriction.  Handouts Provided Include  Detailed MyPlate Micros/Macros  Learning Style & Readiness for Change Teaching method utilized: Visual & Auditory  Demonstrated degree of understanding via: Teach Back  Barriers to learning/adherence to lifestyle change: none identified   Goals Established by Pt I will create balanced meals including complex carbohydrates    MONITORING & EVALUATION Dietary intake, weekly physical activity  Next Steps  Patient is to return in 4-6 weeks.

## 2023-07-06 DIAGNOSIS — Z9689 Presence of other specified functional implants: Secondary | ICD-10-CM | POA: Diagnosis not present

## 2023-07-06 DIAGNOSIS — G250 Essential tremor: Secondary | ICD-10-CM | POA: Diagnosis not present

## 2023-07-11 ENCOUNTER — Other Ambulatory Visit (HOSPITAL_COMMUNITY): Payer: Self-pay

## 2023-07-12 ENCOUNTER — Other Ambulatory Visit: Payer: Self-pay

## 2023-07-15 DIAGNOSIS — G40909 Epilepsy, unspecified, not intractable, without status epilepticus: Secondary | ICD-10-CM | POA: Diagnosis not present

## 2023-07-15 DIAGNOSIS — R569 Unspecified convulsions: Secondary | ICD-10-CM | POA: Diagnosis not present

## 2023-07-16 ENCOUNTER — Telehealth: Payer: Self-pay

## 2023-07-16 DIAGNOSIS — G40309 Generalized idiopathic epilepsy and epileptic syndromes, not intractable, without status epilepticus: Secondary | ICD-10-CM

## 2023-07-16 DIAGNOSIS — Z79899 Other long term (current) drug therapy: Secondary | ICD-10-CM

## 2023-07-16 NOTE — Telephone Encounter (Signed)
 Pt c/o: seizure Missed medications?  Yes.   Missed one day was traveling couldn't find her medication  Sleep deprived?  No. Alcohol  intake?  Yes.   Increased stress? Yes.   A good stress daughter is getting married  Any trigger? No  Any change in medication color or shape? No. Back to their usual baseline self?  Yes.  . If no, advise go to ER Current medications prescribed by Dr. Georjean:   levETIRAcetam  (KEPPRA  XR) 500 MG 24 hr tablet Take 5 tablets (2,500 mg total) by mouth daily as directed  lamoTRIgine  (LAMICTAL ) 100 MG tablet Take 1 tablet (100 mg total) by mouth 2 (two) times daily.  clonazePAM  (KLONOPIN ) 0.5 MG tablet TAKE 1 TABLET BY MOUTH ONCE A DAY AS NEEDED FOR SEIZURE (DONT USE MORE THAN 2-3 A WEEK)   Had a seizure yesterday 1st one in 10 years   Pt was advised of [place] law that she has to be 6 months event free to drive, pt verbalized understanding

## 2023-07-17 ENCOUNTER — Encounter: Payer: Self-pay | Admitting: Neurology

## 2023-07-17 NOTE — Telephone Encounter (Signed)
 Spoke to patient. She has missed both meds in the past with no issues, but this time was different. Took meds Sat morning, missed evening and Sun morning, then had seizure Sunday. She recalls trying to talk and not getting able to get words out, then sat down and was fine, did not quite feel herself but no typical aura then had GTC lasting 1-2 minutes. Bit side of tongue, got nauseated after, no focal weakness. Left knee is really hurting. No sleep deprivation. Had alcohol  Saturday afternoon. There is some additional stress. No new medications, has a little cough but no fever.   Discussed checking Lamictal  and Keppra  levels, instructed to do first thing in AM.   Heather, pls put in order for levels, she knows to check in at our office before going to lab. Thanks

## 2023-07-17 NOTE — Telephone Encounter (Signed)
 Dr Georjean called pt and we are doing lab work ,

## 2023-07-18 ENCOUNTER — Other Ambulatory Visit

## 2023-07-18 DIAGNOSIS — Z79899 Other long term (current) drug therapy: Secondary | ICD-10-CM | POA: Diagnosis not present

## 2023-07-18 DIAGNOSIS — G40309 Generalized idiopathic epilepsy and epileptic syndromes, not intractable, without status epilepticus: Secondary | ICD-10-CM | POA: Diagnosis not present

## 2023-07-19 ENCOUNTER — Other Ambulatory Visit (HOSPITAL_COMMUNITY): Payer: Self-pay

## 2023-07-22 LAB — LAMOTRIGINE LEVEL: Lamotrigine Lvl: 2.8 ug/mL (ref 2.5–15.0)

## 2023-07-22 LAB — LEVETIRACETAM, IMMUNOASSAY: LEVETIRACETAM, IMMUNOASSAY: 14.6 ug/mL (ref 6.0–46.0)

## 2023-07-23 ENCOUNTER — Ambulatory Visit: Payer: Self-pay | Admitting: Neurology

## 2023-07-24 ENCOUNTER — Other Ambulatory Visit: Payer: Self-pay

## 2023-07-24 MED ORDER — LAMOTRIGINE 100 MG PO TABS
150.0000 mg | ORAL_TABLET | Freq: Two times a day (BID) | ORAL | 3 refills | Status: AC
  Filled 2023-07-24: qty 270, 90d supply, fill #0
  Filled 2023-09-24 – 2023-10-23 (×2): qty 270, 90d supply, fill #1
  Filled 2024-01-21: qty 270, 90d supply, fill #2

## 2023-07-24 NOTE — Progress Notes (Signed)
 Relayed via MyChart, Rx sent

## 2023-08-06 ENCOUNTER — Encounter: Attending: Endocrinology | Admitting: Skilled Nursing Facility1

## 2023-08-06 ENCOUNTER — Encounter: Payer: Self-pay | Admitting: Skilled Nursing Facility1

## 2023-08-06 ENCOUNTER — Other Ambulatory Visit (HOSPITAL_COMMUNITY): Payer: Self-pay

## 2023-08-06 DIAGNOSIS — E631 Imbalance of constituents of food intake: Secondary | ICD-10-CM | POA: Insufficient documentation

## 2023-08-06 NOTE — Progress Notes (Signed)
 Medical Nutrition Therapy  Appointment Start time:  4:00  Appointment End time:  4:39  Primary concerns today: to lose weight  Referral diagnosis: [employer] employee   NUTRITION ASSESSMENT    Clinical Medical Hx:  Medications: Labs:  Notable Signs/Symptoms:   Lifestyle & Dietary Hx  Pt states she recognizes activity has to be a part of a new healthy lifestyle.   Pt states she had a seizure about 5 weeks ago after not having one for over 10 years.  Pt states she does skip breakfast and lunch fairly often.  Pt states she has been consistent with walking and feels she is getting more and more fit from it which is motivating.   Estimated daily fluid intake:  oz Supplements:  Sleep:  Stress / self-care:  Current average weekly physical activity: walking 2 times a week   24-Hr Dietary Recall First Meal: cereal + milk Snack: almonds  Second Meal: ham and cheese  Snack:  Third Meal: hamburger + chips Snack: ice cream  Beverages: unsweet tea   NUTRITION INTERVENTION  Nutrition education (E-1) on the following topics:  Creation of balanced and diverse meals to increase the intake of nutrient-rich foods that provide essential vitamins, minerals, fiber, and phytonutrients Variety of Fruits and Vegetables: Aim for a colorful array of fruits and vegetables to ensure a wide range of nutrients. Include a mix of leafy greens, berries, citrus fruits, cruciferous vegetables, and more. Whole Grains: Choose whole grains over refined grains. Examples include [place] rice, quinoa, oats, whole wheat, and barley. Lean Proteins: Include lean sources of protein, such as poultry, fish, tofu, legumes, beans, lentils, and low-fat dairy products. Limit red and processed meats. Healthy Fats: Incorporate sources of healthy fats, including avocados, nuts, seeds, and olive oil. Limit saturated and trans fats found in fried and processed foods. Dairy or Dairy Alternatives: Choose low-fat or fat-free  dairy products, or plant-based alternatives like almond or soy milk. Portion Control: Be mindful of portion sizes to avoid overeating. Pay attention to hunger and satisfaction cues. Limit Added Sugars: Minimize the consumption of sugary beverages, snacks, and desserts. Check food labels for added sugars and opt for natural sources of sweetness such as whole fruits. Hydration: Drink plenty of water throughout the day. Limit sugary drinks and excessive caffeine intake. Moderate Sodium Intake: Reduce the consumption of high-sodium foods. Use herbs and spices for flavor instead of excessive salt. Meal Planning and Preparation: Plan and prepare meals ahead of time to make healthier choices more convenient. Include a mix of food groups in each meal. Limit Processed Foods: Minimize the intake of highly processed and packaged foods that are often high in added sugars, salt, and unhealthy fats. Regular Physical Activity: Combine a healthy diet with regular physical activity for overall well-being. Aim for at least 150 minutes of moderate-intensity aerobic exercise per week, along with strength training. Moderation and Balance: Enjoy treats and indulgent foods in moderation, emphasizing balance rather than strict restriction.  Handouts Previously Provided Include  Detailed MyPlate Micros/Macros  Learning Style & Readiness for Change Teaching method utilized: Visual & Auditory  Demonstrated degree of understanding via: Teach Back  Barriers to learning/adherence to lifestyle change: none identified   Goals Established by Pt I will create balanced meals including complex carbohydrates  I will continue my walks  I will add in resistance bands about 2-3 times a week   MONITORING & EVALUATION Dietary intake, weekly physical activity  Next Steps  Patient is to return in 4-6 weeks.

## 2023-08-09 ENCOUNTER — Other Ambulatory Visit (HOSPITAL_COMMUNITY): Payer: Self-pay

## 2023-08-09 MED ORDER — PRIMIDONE 50 MG PO TABS
ORAL_TABLET | ORAL | 5 refills | Status: AC
  Filled 2023-08-09 – 2023-10-23 (×4): qty 150, 30d supply, fill #0
  Filled 2024-01-14: qty 150, 30d supply, fill #1

## 2023-08-15 ENCOUNTER — Other Ambulatory Visit (HOSPITAL_COMMUNITY): Payer: Self-pay

## 2023-08-15 MED ORDER — PRIMIDONE 50 MG PO TABS
ORAL_TABLET | ORAL | 0 refills | Status: AC
  Filled 2023-08-31 – 2024-01-14 (×3): qty 150, 60d supply, fill #0
  Filled 2024-01-15: qty 150, 30d supply, fill #0

## 2023-08-31 ENCOUNTER — Other Ambulatory Visit (HOSPITAL_COMMUNITY): Payer: Self-pay

## 2023-09-01 ENCOUNTER — Other Ambulatory Visit (HOSPITAL_COMMUNITY): Payer: Self-pay

## 2023-09-04 ENCOUNTER — Ambulatory Visit: Payer: Commercial Managed Care - PPO | Admitting: Neurology

## 2023-09-10 ENCOUNTER — Ambulatory Visit: Payer: Commercial Managed Care - PPO | Admitting: Neurology

## 2023-09-14 DIAGNOSIS — E559 Vitamin D deficiency, unspecified: Secondary | ICD-10-CM | POA: Diagnosis not present

## 2023-09-14 DIAGNOSIS — Z1212 Encounter for screening for malignant neoplasm of rectum: Secondary | ICD-10-CM | POA: Diagnosis not present

## 2023-09-14 DIAGNOSIS — R7301 Impaired fasting glucose: Secondary | ICD-10-CM | POA: Diagnosis not present

## 2023-09-14 DIAGNOSIS — I10 Essential (primary) hypertension: Secondary | ICD-10-CM | POA: Diagnosis not present

## 2023-09-14 DIAGNOSIS — E039 Hypothyroidism, unspecified: Secondary | ICD-10-CM | POA: Diagnosis not present

## 2023-09-14 DIAGNOSIS — E785 Hyperlipidemia, unspecified: Secondary | ICD-10-CM | POA: Diagnosis not present

## 2023-09-17 ENCOUNTER — Ambulatory Visit: Admitting: Skilled Nursing Facility1

## 2023-09-24 ENCOUNTER — Other Ambulatory Visit (HOSPITAL_COMMUNITY): Payer: Self-pay

## 2023-09-24 ENCOUNTER — Other Ambulatory Visit: Payer: Self-pay | Admitting: Neurology

## 2023-09-25 ENCOUNTER — Other Ambulatory Visit (HOSPITAL_COMMUNITY): Payer: Self-pay

## 2023-09-25 ENCOUNTER — Other Ambulatory Visit: Payer: Self-pay

## 2023-09-25 ENCOUNTER — Encounter: Payer: Self-pay | Admitting: Sports Medicine

## 2023-09-25 DIAGNOSIS — E039 Hypothyroidism, unspecified: Secondary | ICD-10-CM | POA: Diagnosis not present

## 2023-09-25 DIAGNOSIS — Z1212 Encounter for screening for malignant neoplasm of rectum: Secondary | ICD-10-CM | POA: Diagnosis not present

## 2023-09-25 MED ORDER — LEVETIRACETAM ER 500 MG PO TB24
2500.0000 mg | ORAL_TABLET | Freq: Every day | ORAL | 3 refills | Status: AC
  Filled 2023-09-25: qty 450, 90d supply, fill #0
  Filled 2023-12-29: qty 450, 90d supply, fill #1

## 2023-09-26 DIAGNOSIS — E785 Hyperlipidemia, unspecified: Secondary | ICD-10-CM | POA: Diagnosis not present

## 2023-09-26 DIAGNOSIS — I700 Atherosclerosis of aorta: Secondary | ICD-10-CM | POA: Diagnosis not present

## 2023-09-26 DIAGNOSIS — I10 Essential (primary) hypertension: Secondary | ICD-10-CM | POA: Diagnosis not present

## 2023-09-26 DIAGNOSIS — R569 Unspecified convulsions: Secondary | ICD-10-CM | POA: Diagnosis not present

## 2023-09-26 DIAGNOSIS — E039 Hypothyroidism, unspecified: Secondary | ICD-10-CM | POA: Diagnosis not present

## 2023-09-26 DIAGNOSIS — M25572 Pain in left ankle and joints of left foot: Secondary | ICD-10-CM | POA: Diagnosis not present

## 2023-09-26 DIAGNOSIS — D126 Benign neoplasm of colon, unspecified: Secondary | ICD-10-CM | POA: Diagnosis not present

## 2023-09-26 DIAGNOSIS — R82998 Other abnormal findings in urine: Secondary | ICD-10-CM | POA: Diagnosis not present

## 2023-09-26 DIAGNOSIS — Z8673 Personal history of transient ischemic attack (TIA), and cerebral infarction without residual deficits: Secondary | ICD-10-CM | POA: Diagnosis not present

## 2023-09-26 DIAGNOSIS — G250 Essential tremor: Secondary | ICD-10-CM | POA: Diagnosis not present

## 2023-09-26 DIAGNOSIS — Z0000 Encounter for general adult medical examination without abnormal findings: Secondary | ICD-10-CM | POA: Diagnosis not present

## 2023-10-17 ENCOUNTER — Ambulatory Visit: Admitting: Skilled Nursing Facility1

## 2023-10-24 ENCOUNTER — Other Ambulatory Visit (HOSPITAL_COMMUNITY): Payer: Self-pay

## 2023-10-24 ENCOUNTER — Other Ambulatory Visit: Payer: Self-pay

## 2023-11-02 ENCOUNTER — Other Ambulatory Visit: Payer: Self-pay

## 2023-11-02 ENCOUNTER — Other Ambulatory Visit (HOSPITAL_COMMUNITY): Payer: Self-pay

## 2023-11-02 MED ORDER — EZETIMIBE 10 MG PO TABS
10.0000 mg | ORAL_TABLET | Freq: Every day | ORAL | 3 refills | Status: AC
  Filled 2023-11-02: qty 90, 90d supply, fill #0
  Filled 2024-02-11: qty 90, 90d supply, fill #1

## 2023-11-08 ENCOUNTER — Other Ambulatory Visit (HOSPITAL_BASED_OUTPATIENT_CLINIC_OR_DEPARTMENT_OTHER): Payer: Self-pay

## 2023-11-08 MED ORDER — FLUZONE 0.5 ML IM SUSY
0.5000 mL | PREFILLED_SYRINGE | Freq: Once | INTRAMUSCULAR | 0 refills | Status: AC
  Filled 2023-11-08: qty 0.5, 1d supply, fill #0

## 2023-11-19 ENCOUNTER — Other Ambulatory Visit (HOSPITAL_COMMUNITY): Payer: Self-pay

## 2023-11-19 DIAGNOSIS — Z4542 Encounter for adjustment and management of neuropacemaker (brain) (peripheral nerve) (spinal cord): Secondary | ICD-10-CM | POA: Diagnosis not present

## 2023-11-19 DIAGNOSIS — H524 Presbyopia: Secondary | ICD-10-CM | POA: Diagnosis not present

## 2023-11-19 DIAGNOSIS — H04122 Dry eye syndrome of left lacrimal gland: Secondary | ICD-10-CM | POA: Diagnosis not present

## 2023-11-19 DIAGNOSIS — G250 Essential tremor: Secondary | ICD-10-CM | POA: Diagnosis not present

## 2023-11-19 MED ORDER — PRIMIDONE 50 MG PO TABS
ORAL_TABLET | ORAL | 5 refills | Status: AC
  Filled 2023-12-22: qty 150, 30d supply, fill #0
  Filled 2024-02-18 (×2): qty 150, 30d supply, fill #1

## 2023-12-06 ENCOUNTER — Encounter: Payer: Self-pay | Admitting: Neurology

## 2023-12-19 ENCOUNTER — Ambulatory Visit: Admitting: Skilled Nursing Facility1

## 2023-12-19 DIAGNOSIS — M25562 Pain in left knee: Secondary | ICD-10-CM | POA: Diagnosis not present

## 2023-12-22 ENCOUNTER — Other Ambulatory Visit (HOSPITAL_COMMUNITY): Payer: Self-pay

## 2023-12-22 MED ORDER — LEVOTHYROXINE SODIUM 100 MCG PO TABS
100.0000 ug | ORAL_TABLET | Freq: Every morning | ORAL | 3 refills | Status: AC
  Filled 2023-12-22: qty 90, 90d supply, fill #0

## 2023-12-24 ENCOUNTER — Other Ambulatory Visit: Payer: Self-pay

## 2023-12-29 ENCOUNTER — Other Ambulatory Visit (HOSPITAL_COMMUNITY): Payer: Self-pay

## 2023-12-31 ENCOUNTER — Other Ambulatory Visit: Payer: Self-pay

## 2023-12-31 ENCOUNTER — Encounter: Payer: Self-pay | Admitting: Internal Medicine

## 2024-01-01 ENCOUNTER — Other Ambulatory Visit (HOSPITAL_COMMUNITY): Payer: Self-pay

## 2024-01-02 ENCOUNTER — Other Ambulatory Visit (HOSPITAL_COMMUNITY): Payer: Self-pay

## 2024-01-02 ENCOUNTER — Other Ambulatory Visit (HOSPITAL_BASED_OUTPATIENT_CLINIC_OR_DEPARTMENT_OTHER): Payer: Self-pay

## 2024-01-02 MED ORDER — LEVOTHYROXINE SODIUM 100 MCG PO TABS
100.0000 ug | ORAL_TABLET | Freq: Every day | ORAL | 3 refills | Status: AC
  Filled 2024-01-02 – 2024-01-25 (×7): qty 90, 90d supply, fill #0

## 2024-01-08 ENCOUNTER — Other Ambulatory Visit (HOSPITAL_COMMUNITY): Payer: Self-pay

## 2024-01-08 ENCOUNTER — Other Ambulatory Visit: Payer: Self-pay

## 2024-01-09 ENCOUNTER — Telehealth: Payer: Self-pay | Admitting: Gastroenterology

## 2024-01-09 ENCOUNTER — Encounter: Payer: Self-pay | Admitting: Gastroenterology

## 2024-01-09 NOTE — Telephone Encounter (Signed)
 Ok to schedule next available appointment for office visit

## 2024-01-09 NOTE — Telephone Encounter (Signed)
 Good afternoon Dr. Nandigam,   I received a call from this patient stating that she would like to transfer her care over to our practice due to how Dr. Donnald office has declined since he has retired. Patient was last seen 3 years ago and had a colonic mass removed. Patient stated that she has never seen Dr. Rosalie, and has only been seen with PA. Patient would like to establish care here. Would you please advise on scheduling this patient.    Thank you.

## 2024-01-10 ENCOUNTER — Other Ambulatory Visit: Payer: Self-pay

## 2024-01-14 ENCOUNTER — Other Ambulatory Visit (HOSPITAL_COMMUNITY): Payer: Self-pay

## 2024-01-15 ENCOUNTER — Other Ambulatory Visit: Payer: Self-pay

## 2024-01-15 ENCOUNTER — Other Ambulatory Visit (HOSPITAL_COMMUNITY): Payer: Self-pay

## 2024-01-19 ENCOUNTER — Other Ambulatory Visit (HOSPITAL_COMMUNITY): Payer: Self-pay

## 2024-01-20 ENCOUNTER — Other Ambulatory Visit (HOSPITAL_COMMUNITY): Payer: Self-pay

## 2024-01-21 ENCOUNTER — Other Ambulatory Visit: Payer: Self-pay

## 2024-01-22 ENCOUNTER — Other Ambulatory Visit: Payer: Self-pay

## 2024-01-23 ENCOUNTER — Other Ambulatory Visit (HOSPITAL_COMMUNITY): Payer: Self-pay

## 2024-01-23 ENCOUNTER — Encounter: Payer: Self-pay | Admitting: Skilled Nursing Facility1

## 2024-01-23 ENCOUNTER — Other Ambulatory Visit: Payer: Self-pay

## 2024-01-23 ENCOUNTER — Encounter: Admitting: Skilled Nursing Facility1

## 2024-01-23 DIAGNOSIS — E631 Imbalance of constituents of food intake: Secondary | ICD-10-CM | POA: Insufficient documentation

## 2024-01-23 NOTE — Progress Notes (Signed)
 Medical Nutrition Therapy  Appointment Start time: 5:24  Appointment End time: 6:00  Primary concerns today: to lose weight  Referral diagnosis: [employer] employee   NUTRITION ASSESSMENT    Clinical Medical Hx:  Medications: Labs:  Notable Signs/Symptoms:   Lifestyle & Dietary Hx  Pt states she switched from keto so now doing weight watchers stating she has lost 8 pounds. Pt states now that she is cleared to drive again she may start going back to the gym.  Pt state she really enjoys weight watchers and sees where it can work for her long term.   Estimated daily fluid intake:  oz Supplements:  Sleep:  Stress / self-care:  Current average weekly physical activity: walking 2 times a week   24-Hr Dietary Recall First Meal: cereal + milk or protein shake + coffee Snack: almonds  Second Meal: ham and cheese  Snack: fruit Third Meal: hamburger + chips Snack: ice cream  Beverages: unsweet tea   NUTRITION INTERVENTION  Nutrition education (E-1) on the following topics: continued  Creation of balanced and diverse meals to increase the intake of nutrient-rich foods that provide essential vitamins, minerals, fiber, and phytonutrients Variety of Fruits and Vegetables: Aim for a colorful array of fruits and vegetables to ensure a wide range of nutrients. Include a mix of leafy greens, berries, citrus fruits, cruciferous vegetables, and more. Whole Grains: Choose whole grains over refined grains. Examples include [place] rice, quinoa, oats, whole wheat, and barley. Lean Proteins: Include lean sources of protein, such as poultry, fish, tofu, legumes, beans, lentils, and low-fat dairy products. Limit red and processed meats. Healthy Fats: Incorporate sources of healthy fats, including avocados, nuts, seeds, and olive oil. Limit saturated and trans fats found in fried and processed foods. Dairy or Dairy Alternatives: Choose low-fat or fat-free dairy products, or plant-based  alternatives like almond or soy milk. Portion Control: Be mindful of portion sizes to avoid overeating. Pay attention to hunger and satisfaction cues. Limit Added Sugars: Minimize the consumption of sugary beverages, snacks, and desserts. Check food labels for added sugars and opt for natural sources of sweetness such as whole fruits. Hydration: Drink plenty of water throughout the day. Limit sugary drinks and excessive caffeine intake. Moderate Sodium Intake: Reduce the consumption of high-sodium foods. Use herbs and spices for flavor instead of excessive salt. Meal Planning and Preparation: Plan and prepare meals ahead of time to make healthier choices more convenient. Include a mix of food groups in each meal. Limit Processed Foods: Minimize the intake of highly processed and packaged foods that are often high in added sugars, salt, and unhealthy fats. Regular Physical Activity: Combine a healthy diet with regular physical activity for overall well-being. Aim for at least 150 minutes of moderate-intensity aerobic exercise per week, along with strength training. Moderation and Balance: Enjoy treats and indulgent foods in moderation, emphasizing balance rather than strict restriction.  Handouts Previously Provided Include  Detailed MyPlate Micros/Macros  Learning Style & Readiness for Change Teaching method utilized: Visual & Auditory  Demonstrated degree of understanding via: Teach Back  Barriers to learning/adherence to lifestyle change: none identified   Goals Established by Pt I will continue with weight watchers    MONITORING & EVALUATION Dietary intake, weekly physical activity

## 2024-01-25 ENCOUNTER — Other Ambulatory Visit (HOSPITAL_COMMUNITY): Payer: Self-pay

## 2024-02-11 ENCOUNTER — Other Ambulatory Visit (HOSPITAL_COMMUNITY): Payer: Self-pay

## 2024-02-12 ENCOUNTER — Other Ambulatory Visit (HOSPITAL_COMMUNITY): Payer: Self-pay

## 2024-02-12 ENCOUNTER — Other Ambulatory Visit: Payer: Self-pay

## 2024-02-12 MED ORDER — AMLODIPINE BESYLATE 5 MG PO TABS
5.0000 mg | ORAL_TABLET | Freq: Every day | ORAL | 3 refills | Status: AC
  Filled 2024-02-12: qty 90, 90d supply, fill #0

## 2024-02-13 ENCOUNTER — Other Ambulatory Visit (HOSPITAL_COMMUNITY): Payer: Self-pay

## 2024-02-13 ENCOUNTER — Other Ambulatory Visit: Payer: Self-pay

## 2024-02-18 ENCOUNTER — Other Ambulatory Visit (HOSPITAL_COMMUNITY): Payer: Self-pay

## 2024-03-04 ENCOUNTER — Ambulatory Visit: Admitting: Gastroenterology

## 2024-03-31 ENCOUNTER — Ambulatory Visit: Admitting: Neurology
